# Patient Record
Sex: Female | Born: 1955
Health system: Southern US, Community
[De-identification: ages and names within clinical notes are randomized; demographics above are authoritative.]

## PROBLEM LIST (undated history)

## (undated) DIAGNOSIS — E559 Vitamin D deficiency, unspecified: Secondary | ICD-10-CM

## (undated) DIAGNOSIS — G8929 Other chronic pain: Secondary | ICD-10-CM

## (undated) DIAGNOSIS — M8000XA Age-related osteoporosis with current pathological fracture, unspecified site, initial encounter for fracture: Secondary | ICD-10-CM

## (undated) DIAGNOSIS — R519 Headache, unspecified: Secondary | ICD-10-CM

## (undated) DIAGNOSIS — M549 Dorsalgia, unspecified: Secondary | ICD-10-CM

## (undated) DIAGNOSIS — D649 Anemia, unspecified: Secondary | ICD-10-CM

## (undated) DIAGNOSIS — Z973 Presence of spectacles and contact lenses: Secondary | ICD-10-CM

## (undated) DIAGNOSIS — E039 Hypothyroidism, unspecified: Secondary | ICD-10-CM

## (undated) DIAGNOSIS — F419 Anxiety disorder, unspecified: Secondary | ICD-10-CM

## (undated) DIAGNOSIS — E538 Deficiency of other specified B group vitamins: Secondary | ICD-10-CM

## (undated) HISTORY — PX: TONSILLECTOMY: SUR1361

## (undated) HISTORY — DX: Anemia, unspecified: D64.9

## (undated) HISTORY — DX: Hypothyroidism, unspecified: E03.9

## (undated) HISTORY — DX: Age-related osteoporosis with current pathological fracture, unspecified site, initial encounter for fracture: M80.00XA

## (undated) HISTORY — PX: ESOPHAGOGASTRODUODENOSCOPY: SHX1529

---

## 2002-01-08 HISTORY — PX: UPPER GI ENDOSCOPY: SHX6162

## 2002-01-08 HISTORY — PX: GASTRIC BYPASS: SHX52

## 2002-02-16 ENCOUNTER — Ambulatory Visit (HOSPITAL_COMMUNITY): Admission: RE | Admit: 2002-02-16 | Discharge: 2002-02-16 | Payer: Self-pay | Admitting: Obstetrics and Gynecology

## 2002-02-16 ENCOUNTER — Encounter: Payer: Self-pay | Admitting: Obstetrics and Gynecology

## 2004-09-08 ENCOUNTER — Ambulatory Visit (HOSPITAL_COMMUNITY): Admission: RE | Admit: 2004-09-08 | Discharge: 2004-09-08 | Payer: Self-pay | Admitting: Obstetrics and Gynecology

## 2005-11-30 ENCOUNTER — Ambulatory Visit (HOSPITAL_COMMUNITY): Admission: RE | Admit: 2005-11-30 | Discharge: 2005-11-30 | Payer: Self-pay | Admitting: Obstetrics and Gynecology

## 2007-12-05 ENCOUNTER — Ambulatory Visit (HOSPITAL_COMMUNITY): Admission: RE | Admit: 2007-12-05 | Discharge: 2007-12-05 | Payer: Self-pay | Admitting: Obstetrics and Gynecology

## 2009-05-02 ENCOUNTER — Ambulatory Visit (HOSPITAL_COMMUNITY): Admission: RE | Admit: 2009-05-02 | Discharge: 2009-05-02 | Payer: Self-pay | Admitting: Obstetrics and Gynecology

## 2011-04-03 ENCOUNTER — Ambulatory Visit (HOSPITAL_COMMUNITY)
Admission: RE | Admit: 2011-04-03 | Discharge: 2011-04-03 | Disposition: A | Discharge status: Home / Self Care | Payer: BC Managed Care – PPO | Source: Ambulatory Visit | Attending: Pulmonary Disease | Admitting: Pulmonary Disease

## 2011-04-03 ENCOUNTER — Other Ambulatory Visit (HOSPITAL_COMMUNITY): Payer: Self-pay | Admitting: Pulmonary Disease

## 2011-04-03 DIAGNOSIS — R059 Cough, unspecified: Secondary | ICD-10-CM | POA: Insufficient documentation

## 2011-04-03 DIAGNOSIS — R509 Fever, unspecified: Secondary | ICD-10-CM

## 2011-04-03 DIAGNOSIS — R05 Cough: Secondary | ICD-10-CM

## 2012-07-14 ENCOUNTER — Other Ambulatory Visit: Payer: Self-pay | Admitting: Internal Medicine

## 2012-07-14 DIAGNOSIS — Z139 Encounter for screening, unspecified: Secondary | ICD-10-CM

## 2012-07-25 ENCOUNTER — Ambulatory Visit (HOSPITAL_COMMUNITY)
Admission: RE | Admit: 2012-07-25 | Discharge: 2012-07-25 | Disposition: A | Discharge status: Home / Self Care | Payer: BC Managed Care – PPO | Source: Ambulatory Visit | Attending: Internal Medicine | Admitting: Internal Medicine

## 2012-07-25 DIAGNOSIS — Z139 Encounter for screening, unspecified: Secondary | ICD-10-CM

## 2012-07-25 DIAGNOSIS — Z1231 Encounter for screening mammogram for malignant neoplasm of breast: Secondary | ICD-10-CM | POA: Insufficient documentation

## 2012-08-20 ENCOUNTER — Other Ambulatory Visit: Payer: Self-pay | Admitting: Geriatric Medicine

## 2013-06-23 ENCOUNTER — Telehealth: Payer: Self-pay

## 2013-07-01 NOTE — Telephone Encounter (Signed)
Pt is scheduled an OV with Gerrit HallsAnna Sams, NP on 07/27/2013 at 1:30 PM due to low hemoglobin and to schedule colonoscopy.

## 2013-07-27 ENCOUNTER — Encounter (INDEPENDENT_AMBULATORY_CARE_PROVIDER_SITE_OTHER): Payer: Self-pay

## 2013-07-27 ENCOUNTER — Encounter: Payer: Self-pay | Admitting: Gastroenterology

## 2013-07-27 ENCOUNTER — Ambulatory Visit (INDEPENDENT_AMBULATORY_CARE_PROVIDER_SITE_OTHER): Payer: BC Managed Care – PPO | Admitting: Gastroenterology

## 2013-07-27 VITALS — BP 131/70 | HR 64 | Temp 97.7°F | Resp 18 | Ht 68.0 in | Wt 227.0 lb

## 2013-07-27 DIAGNOSIS — Z1211 Encounter for screening for malignant neoplasm of colon: Secondary | ICD-10-CM

## 2013-07-27 DIAGNOSIS — D6489 Other specified anemias: Secondary | ICD-10-CM

## 2013-07-27 DIAGNOSIS — K912 Postsurgical malabsorption, not elsewhere classified: Secondary | ICD-10-CM

## 2013-07-27 MED ORDER — PEG 3350-KCL-NA BICARB-NACL 420 G PO SOLR: 4000.0000 mL | ORAL | Status: DC

## 2013-07-27 NOTE — Progress Notes (Signed)
Primary Care Physician:  Bufford SpikesEED, [name], DO Primary Gastroenterologist:  Dr. Darrick PennaFields   Chief Complaint  Patient presents with  . 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 She has a history of gastric bypass in the remote past, with reported anemia. No recent blood work completed. No prior colonoscopy. No FH of colon cancer. No FH of colon polyps. Mom had history of Barrett's esophagus. Has history of anemia, with Hgb around 10 or 11 for several years. Denies any prior hemoccult positive tests. Feels fatigued. Last blood work in Jan 2014.No abdominal pain. No N/V. No dysphagia. No GERD. No constipation/diarrhea. No hematochezia or melena. Takes NSAIDs routinely for headaches.   Past Medical History  Diagnosis Date  . Anemia   . Hypertension     resolved after gastric bypass  . Diabetes mellitus     resolved after gastric bypass  . Hypothyroidism     Past Surgical History  Procedure Laterality Date  . [place]  . Cesarean section   x 2    Current Outpatient Prescriptions  Medication Sig Dispense Refill  . escitalopram (LEXAPRO) 10 MG tablet Take 10 mg by mouth daily.      Marland Kitchen. levothyroxine (SYNTHROID, LEVOTHROID) 50 MCG tablet Take 50 mcg by mouth daily before breakfast.       No current facility-administered medications for this visit.    Allergies as of 07/27/2013  . (Not on File)    Family History  Problem Relation Age of Onset  . Heart disease Mother   . Diabetes Mother   . Stroke Mother   . Hypertension Mother   . Diabetes Father   . Heart disease Father   . Hypertension Father   . Colon cancer Neg Hx     History   Social History  . Marital Status: Married    Spouse Name: N/A    Number of Children: N/A  . Years of Education: N/A   Occupational History  . Dentist    Social History Main Topics  . Smoking  status: Former Games developermoker  . Smokeless tobacco: Not on file  . Alcohol Use: No  . Drug Use: No  . Sexual Activity: Not on file   Other Topics Concern  . Not on file   Social History Narrative  . No narrative on file    Review of Systems: Gen: Denies any fever, chills, fatigue, weight loss, lack of appetite.  CV: Denies chest pain, heart palpitations, peripheral edema, syncope.  Resp: Denies shortness of breath at rest or with exertion. Denies wheezing or cough.  GI: see HPI GU : Denies urinary burning, urinary frequency, urinary hesitancy MS: back pain Derm: Denies rash, itching, dry skin Psych: Denies depression, anxiety, memory loss, and confusion Heme: Denies bruising, bleeding, and enlarged lymph nodes.  Physical Exam: BP 131/70  Pulse 64  Temp(Src) 97.7 F (36.5 C) (Oral)  Resp 18  Ht 5\' 8"  (1.727 m)  Wt 227 lb (102.967 kg)  BMI 34.52 kg/m2 General:   Alert and oriented. Pleasant and cooperative. Well-nourished and well-developed.  Head:  Normocephalic and atraumatic. Eyes:  Without icterus, sclera clear and conjunctiva pink.  Ears:  Normal auditory acuity. Nose:  No deformity, discharge,  or lesions. Mouth:  No deformity or lesions, oral mucosa pink.  Lungs:  Clear to auscultation bilaterally.  No wheezes, rales, or rhonchi. No distress.  Heart:  S1, S2 present without murmurs appreciated.  Abdomen:  +BS, soft, non-tender and non-distended. No HSM noted. No guarding or rebound. No masses appreciated.  Rectal:  Deferred  Msk:  Symmetrical without gross deformities. Normal posture. Pulses:  Normal pulses noted. Extremities:  Without clubbing or edema. Neurologic:  Alert and  oriented x4;  grossly normal neurologically. Skin:  Intact without significant lesions or rashes. Psych:  Alert and cooperative. Normal mood and affect.     Marland Kitchen

## 2013-07-27 NOTE — Patient Instructions (Signed)
We have scheduled you for a colonoscopy with possible upper endoscopy with Dr. Darrick PennaFields.  Please complete the blood work when you are able. We will call with the results.

## 2013-07-28 LAB — CBC
HEMATOCRIT: 31.4 % — AB (ref 36.0–46.0)
Hemoglobin: 10.5 g/dL — ABNORMAL LOW (ref 12.0–15.0)
MCH: 30 pg (ref 26.0–34.0)
MCHC: 33.4 g/dL (ref 30.0–36.0)
MCV: 89.7 fL (ref 78.0–100.0)
PLATELETS: 381 10*3/uL (ref 150–400)
RBC: 3.5 MIL/uL — AB (ref 3.87–5.11)
RDW: 13.8 % (ref 11.5–15.5)
WBC: 3.9 10*3/uL — AB (ref 4.0–10.5)

## 2013-07-28 LAB — HEMOGLOBIN A1C
Hgb A1c MFr Bld: 5.7 % — ABNORMAL HIGH (ref <5.7)
MEAN PLASMA GLUCOSE: 117 mg/dL — AB (ref <117)

## 2013-07-28 LAB — FERRITIN: Ferritin: 5 ng/mL — ABNORMAL LOW (ref 10–291)

## 2013-07-28 LAB — VITAMIN B12: VITAMIN B 12: 220 pg/mL (ref 211–911)

## 2013-07-28 LAB — IRON: Iron: 27 ug/dL — ABNORMAL LOW (ref 42–145)

## 2013-07-29 ENCOUNTER — Encounter (HOSPITAL_COMMUNITY): Payer: Self-pay | Admitting: Pharmacy Technician

## 2013-07-29 DIAGNOSIS — K912 Postsurgical malabsorption, not elsewhere classified: Secondary | ICD-10-CM | POA: Insufficient documentation

## 2013-07-29 DIAGNOSIS — Z1211 Encounter for screening for malignant neoplasm of colon: Secondary | ICD-10-CM | POA: Insufficient documentation

## 2013-07-29 DIAGNOSIS — D649 Anemia, unspecified: Secondary | ICD-10-CM | POA: Insufficient documentation

## 2013-07-29 LAB — VITAMIN D 25 HYDROXY (VIT D DEFICIENCY, FRACTURES): VIT D 25 HYDROXY: 35 ng/mL (ref 30–89)

## 2013-07-29 NOTE — Assessment & Plan Note (Signed)
Gastric bypass in 2004. Labs as planned.

## 2013-07-29 NOTE — Assessment & Plan Note (Signed)
With history of gastric bypass. Last labs over a year ago. Likely due to malabsorption, IDA. Needs to take supplemental vitamins, iron daily. Check labs now.

## 2013-07-29 NOTE — Assessment & Plan Note (Signed)
58 year old female presenting for initial screening colonoscopy without any concerning signs. No FH of colon cancer. Anemia historically noted; will update labs today and likely pursue EGD at time of TCS if evidence of IDA.   Proceed with colonoscopy +/- EGD with Dr. Darrick PennaFields in the near future. The risks, benefits, and alternatives have been discussed in detail with the patient. They state understanding and desire to proceed.  CBC, iron, ferritin today

## 2013-08-07 ENCOUNTER — Encounter (HOSPITAL_COMMUNITY)
Admission: RE | Disposition: A | Discharge status: Home / Self Care | Payer: Self-pay | Source: Ambulatory Visit | Attending: Gastroenterology

## 2013-08-07 ENCOUNTER — Encounter (HOSPITAL_COMMUNITY): Payer: Self-pay | Admitting: *Deleted

## 2013-08-07 ENCOUNTER — Ambulatory Visit (HOSPITAL_COMMUNITY)
Admission: RE | Admit: 2013-08-07 | Discharge: 2013-08-07 | Disposition: A | Discharge status: Home / Self Care | Payer: BC Managed Care – PPO | Source: Ambulatory Visit | Attending: Gastroenterology | Admitting: Gastroenterology

## 2013-08-07 DIAGNOSIS — K644 Residual hemorrhoidal skin tags: Secondary | ICD-10-CM | POA: Insufficient documentation

## 2013-08-07 DIAGNOSIS — K259 Gastric ulcer, unspecified as acute or chronic, without hemorrhage or perforation: Secondary | ICD-10-CM | POA: Insufficient documentation

## 2013-08-07 DIAGNOSIS — D509 Iron deficiency anemia, unspecified: Secondary | ICD-10-CM

## 2013-08-07 DIAGNOSIS — K222 Esophageal obstruction: Secondary | ICD-10-CM | POA: Insufficient documentation

## 2013-08-07 DIAGNOSIS — Q438 Other specified congenital malformations of intestine: Secondary | ICD-10-CM | POA: Insufficient documentation

## 2013-08-07 DIAGNOSIS — K912 Postsurgical malabsorption, not elsewhere classified: Secondary | ICD-10-CM

## 2013-08-07 DIAGNOSIS — Z9884 Bariatric surgery status: Secondary | ICD-10-CM | POA: Insufficient documentation

## 2013-08-07 DIAGNOSIS — Z79899 Other long term (current) drug therapy: Secondary | ICD-10-CM | POA: Insufficient documentation

## 2013-08-07 DIAGNOSIS — Z87891 Personal history of nicotine dependence: Secondary | ICD-10-CM | POA: Insufficient documentation

## 2013-08-07 DIAGNOSIS — K648 Other hemorrhoids: Secondary | ICD-10-CM | POA: Insufficient documentation

## 2013-08-07 DIAGNOSIS — E039 Hypothyroidism, unspecified: Secondary | ICD-10-CM | POA: Insufficient documentation

## 2013-08-07 HISTORY — PX: COLONOSCOPY: SHX5424

## 2013-08-07 HISTORY — PX: ESOPHAGOGASTRODUODENOSCOPY: SHX5428

## 2013-08-07 HISTORY — DX: Other chronic pain: G89.29

## 2013-08-07 HISTORY — DX: Dorsalgia, unspecified: M54.9

## 2013-08-07 SURGERY — COLONOSCOPY
Anesthesia: Moderate Sedation

## 2013-08-07 MED ORDER — SODIUM CHLORIDE 0.9 % IV SOLN
INTRAVENOUS | Status: DC
  Administered 2013-08-07: 09:00:00 via INTRAVENOUS

## 2013-08-07 MED ORDER — MIDAZOLAM HCL 5 MG/5ML IJ SOLN
INTRAMUSCULAR | Status: DC | PRN
  Administered 2013-08-07: 1 mg via INTRAVENOUS
  Administered 2013-08-07: 2 mg via INTRAVENOUS
  Administered 2013-08-07: 1 mg via INTRAVENOUS
  Administered 2013-08-07: 2 mg via INTRAVENOUS
  Administered 2013-08-07: 1 mg via INTRAVENOUS

## 2013-08-07 MED ORDER — STERILE WATER FOR IRRIGATION IR SOLN
Status: DC | PRN
  Administered 2013-08-07: 10:00:00

## 2013-08-07 MED ORDER — MEPERIDINE HCL 100 MG/ML IJ SOLN
INTRAMUSCULAR | Status: AC
  Filled 2013-08-07: qty 2

## 2013-08-07 MED ORDER — MIDAZOLAM HCL 5 MG/5ML IJ SOLN
INTRAMUSCULAR | Status: AC
  Filled 2013-08-07: qty 10

## 2013-08-07 MED ORDER — MEPERIDINE HCL 100 MG/ML IJ SOLN
INTRAMUSCULAR | Status: DC | PRN
  Administered 2013-08-07 (×3): 25 mg via INTRAVENOUS

## 2013-08-07 MED ORDER — LIDOCAINE VISCOUS 2 % MT SOLN
OROMUCOSAL | Status: DC | PRN
  Administered 2013-08-07: 1 via OROMUCOSAL

## 2013-08-07 NOTE — Progress Notes (Signed)
REVIEWED.  

## 2013-08-07 NOTE — Op Note (Signed)
[facility] [address] [place], [id number]   ENDOSCOPY PROCEDURE REPORT  PATIENT: April [name], April A.  MR#: [id number] BIRTHDATE: [birth year] , 57  yrs. old GENDER: Female  ENDOSCOPIST: Jonette EvaSandi Tamaka Sawin, MD REFERRED NG:[id number]:[name], M.D.  PROCEDURE DATE: 08/07/2013 PROCEDURE:   EGD w/ biopsy INDICATIONS:Iron deficiency anemia Jul 27 2013: Hb 10.5, FERRITIN 5. PSHx: ROUX-en-Y GASTRIC BYPASS.Marland Kitchen. MEDICATIONS: TCS+ Demerol 25 mg IV and Versed 1mg  IV TOPICAL ANESTHETIC:   Viscous Xylocaine  DESCRIPTION OF PROCEDURE:     Physical exam was performed.  Informed consent was obtained from the patient after explaining the benefits, risks, and alternatives to the procedure.  The patient was connected to the monitor and placed in the left lateral position.  Continuous oxygen was provided by nasal cannula and IV medicine administered through an indwelling cannula.  After administration of sedation, the patients esophagus was intubated and the EG-2990i ([id number])  endoscope was advanced under direct visualization to the second portion of the duodenum.  The scope was removed slowly by carefully examining the color, texture, anatomy, and integrity of the mucosa on the way out.  The patient was recovered in endoscopy and discharged home in satisfactory condition.   ESOPHAGUS: A Schatzki ring was found at the gastroesophageal junction and was widely open.   STOMACH: 2 CM GASTRIC POUCH-NORMAL. DUODENUM: SMALL CLEAN BASED ULCER AT ANASTOMOSIS.  COLD FORCEPS BIOPSIES OBTAINED.  OTHERWISE NORMAL SMALL BOWEL.  COMPLICATIONS:   None  ENDOSCOPIC IMPRESSION: 1.   Schatzki ring at the gastroesophageal junction 2.    2 CM GASTRIC POUCH 3.  SMALL CLEAN BASED ULCER AT ANASTOMOSIS  RECOMMENDATIONS: MINIMIZE USE OF NSAIDS. SEE HEMATOLOGY FOR IV IRON. FOLLOW A HIGH FIBER/LOW FAT DIET AS TOLERATED.  AVOID ITEMS THAT CAUSE BLOATING. BIOPSY WILL BE BACK IN 7 DAYS.  Next colonoscopy in 10  years.  CONSIDER OVERTUBE.   REPEAT EXAM:   _______________________________ Rosalie DoctoreSignedJonette Eva:  Bethsaida Siegenthaler, MD 08/07/2013 11:10 AM

## 2013-08-07 NOTE — Op Note (Signed)
[facility] [address] [place], [id number]   COLONOSCOPY PROCEDURE REPORT  PATIENT: April [name], April A.  MR#: [id number] BIRTHDATE: 12/05/1955 , 57  yrs. old GENDER: Female ENDOSCOPIST: Jonette EvaSandi Bhavya Grand, MD REFERRED XB:[id number]:[name], M.D. PROCEDURE DATE:  08/07/2013 PROCEDURE:   Colonoscopy, diagnostic INDICATIONS:Iron Deficiency Anemia. MEDICATIONS: Demerol 50 mg IV and Versed 6 mg IV  DESCRIPTION OF PROCEDURE:    Physical exam was performed.  Informed consent was obtained from the patient after explaining the benefits, risks, and alternatives to procedure.  The patient was connected to monitor and placed in left lateral position. Continuous oxygen was provided by nasal cannula and IV medicine administered through an indwelling cannula.  After administration of sedation and rectal exam, the patients rectum was intubated and the EC-3890Li ([id number]) and EG-2990i ([id number])  colonoscope was advanced under direct visualization to the ileum.  The scope was removed slowly by carefully examining the color, texture, anatomy, and integrity mucosa on the way out.  The patient was recovered in endoscopy and discharged home in satisfactory condition.    COLON FINDINGS: The mucosa appeared normal in the terminal ileum.  , The LEFT colon IS redundant.  Manual abdominal counter-pressure was used to reach the cecum, The colon mucosa was otherwise normal.  , Moderate sized internal hemorrhoids were found.  , and Large external hemorrhoids were found.  PREP QUALITY: excellent.  CECAL W/D TIME: 14 minutes COMPLICATIONS: HR DROPPED TO 48 WITH SCOPE LOOPING IN LEFT COLON.  ENDOSCOPIC IMPRESSION: 1.   Normal mucosa in the terminal ileum 2.   The LEFT colon IS redundant 3.   The colon mucosa was otherwise normal 4.   Moderate sized internal hemorrhoids 5.   Large external hemorrhoids   RECOMMENDATIONS: MINIMIZE USE OF NSAIDS. SEE HEMATOLOGY FOR IV IRON. FOLLOW A HIGH FIBER/LOW  FAT DIET AS TOLERATED.  AVOID ITEMS THAT CAUSE BLOATING. BIOPSY WILL BE BACK IN 7 DAYS.  Next colonoscopy in 10 years. CONSIDER OVERTUBE.     _______________________________ Rosalie DoctoreSignedJonette Eva:  Cerita Rabelo, MD 08/07/2013 10:57 AM

## 2013-08-07 NOTE — H&P (Addendum)
  Primary Care Physician:  Bufford SpikesEED, [name], DO Primary Gastroenterologist:  Dr. Darrick PennaFields  Pre-Procedure History & Physical: HPI:  April LewandowskyCynthia A [name] is a 58 y.o. female here for ANEMIA/GAB.  Past Medical History  Diagnosis Date  . Anemia   . Hypothyroidism   . Chronic back pain     Past Surgical History  Procedure Laterality Date  . [place]  . Ces[place] section   x 2  . Esophagogastroduodenoscopy      Prior to Admission medications   Medication Sig Start Date End Date Taking? Authorizing Provider  acetaminophen (TYLENOL) 500 MG tablet Take 500 mg by mouth every 6 (six) hours as needed for mild pain or headache.   Yes Historical Provider, MD  cholecalciferol (VITAMIN D) 1000 UNITS tablet Take 1,000 Units by mouth daily.   Yes Historical Provider, MD  escitalopram (LEXAPRO) 10 MG tablet Take 10 mg by mouth daily.   Yes Historical Provider, MD  ibuprofen (ADVIL,MOTRIN) 200 MG tablet Take 200 mg by mouth every 6 (six) hours as needed for headache or moderate pain.   Yes Historical Provider, MD  levothyroxine (SYNTHROID, LEVOTHROID) 50 MCG tablet Take 50 mcg by mouth daily before breakfast.   Yes Historical Provider, MD  Multiple Vitamin (MULTIVITAMIN WITH MINERALS) TABS tablet Take 1 tablet by mouth daily.   Yes Historical Provider, MD  polyethylene glycol-electrolytes (TRILYTE) 420 G solution Take 4,000 mLs by mouth as directed. 07/27/13  Yes West BaliSandi L Fields, MD    Allergies as of 07/27/2013  . (Not on File)    Family History  Problem Relation Age of Onset  . Heart disease Mother   . Diabetes Mother   . Stroke Mother   . Hypertension Mother   . Diabetes Father   . Heart disease Father   . Hypertension Father   . Colon cancer Neg Hx     History   Social History  . Marital Status: Married    Spouse Name: N/A    Number of Children: N/A  . Years of Education: N/A   Occupational History  . Dentist    Social History Main Topics  . Smoking status:  Former Smoker -- 0.25 packs/day for 10 years    Types: Cigarettes  . Smokeless tobacco: Not on file  . Alcohol Use: No  . Drug Use: No  . Sexual Activity: Not on file   Other Topics Concern  . Not on file   Social History Narrative  . No narrative on file    Review of Systems: See HPI, otherwise negative ROS   Physical Exam: BP 132/80  Pulse 66  Temp(Src) 98.1 F (36.7 C) (Oral)  Resp 14  Ht 5\' 8"  (1.727 m)  Wt 227 lb (102.967 kg)  BMI 34.52 kg/m2  SpO2 95% General:   Alert,  pleasant and cooperative in NAD Head:  Normocephalic and atraumatic. Neck:  Supple; Lungs:  Clear throughout to auscultation.    Heart:  Regular rate and rhythm. Abdomen:  Soft, nontender and nondistended. Normal bowel sounds, without guarding, and without rebound.   Neurologic:  Alert and  oriented x4;  grossly normal neurologically.  Impression/Plan:    ANEMIA/GASTRIC BYPASS  Plan: 1. TCS/EGD

## 2013-08-07 NOTE — Discharge Instructions (Signed)
YOUR ANEMIA IRON DEFICIENCY ANEMIA IS MOST LIKELY DUE TO HAVING GASTRIC BYPASS. YOUR POUCH IS ~2 CM. You have a small ulcer at your SMALL BOWEL ANASTOMOSIS. You have internal hemorrhoids.  YOU DID NOT HAVE ANY POLYPS. YOUR COLON IS REDUNDANT BUT OTHERWISE NORMAL. YOUR SMALL BOWEL LOOKS NORMAL.    MINIMIZE YOUR USE OF NSAIDS.  YOU SHOULD SEE HEMATOLOGY FOR IV IRON.  FOLLOW A HIGH FIBER/LOW FAT DIET AS TOLERATED. AVOID ITEMS THAT CAUSE BLOATING. SEE INFO BELOW.  YOUR BIOPSY WILL BE BACK IN 7 DAYS.  Next colonoscopy in 10 years.   ENDOSCOPY Care After Read the instructions outlined below and refer to this sheet in the next week. These discharge instructions provide you with general information on caring for yourself after you leave the hospital. While your treatment has been planned according to the most current medical practices available, unavoidable complications occasionally occur. If you have any problems or questions after discharge, call DR. Shantel Wesely, [phone].  ACTIVITY  You may resume your regular activity, but move at a slower pace for the next 24 hours.   Take frequent rest periods for the next 24 hours.   Walking will help get rid of the air and reduce the bloated feeling in your belly (abdomen).   No driving for 24 hours (because of the medicine (anesthesia) used during the test).   You may shower.   Do not sign any important legal documents or operate any machinery for 24 hours (because of the anesthesia used during the test).    NUTRITION  Drink plenty of fluids.   You may resume your normal diet as instructed by your doctor.   Begin with a light meal and progress to your normal diet. Heavy or fried foods are harder to digest and may make you feel sick to your stomach (nauseated).   Avoid alcoholic beverages for 24 hours or as instructed.    MEDICATIONS  You may resume your normal medications.   WHAT YOU CAN EXPECT TODAY  Some feelings of bloating in  the abdomen.   Passage of more gas than usual.   Spotting of blood in your stool or on the toilet paper  .  IF YOU HAD POLYPS REMOVED DURING THE ENDOSCOPY:  Eat a soft diet IF YOU HAVE NAUSEA, BLOATING, ABDOMINAL PAIN, OR VOMITING.    FINDING OUT THE RESULTS OF YOUR TEST Not all test results are available during your visit. DR. Darrick PennaFIELDS WILL CALL YOU WITHIN 7 DAYS OF YOUR PROCEDUE WITH YOUR RESULTS. Do not assume everything is normal if you have not heard from DR. Claudio Mondry IN ONE WEEK, CALL HER OFFICE AT [phone].  SEEK IMMEDIATE MEDICAL ATTENTION AND CALL THE OFFICE: [phone] IF:  You have more than a spotting of blood in your stool.   Your belly is swollen (abdominal distention).   You are nauseated or vomiting.   You have a temperature over 101F.   You have abdominal pain or discomfort that is severe or gets worse throughout the day.   High-Fiber Diet A high-fiber diet changes your normal diet to include more whole grains, legumes, fruits, and vegetables. Changes in the diet involve replacing refined carbohydrates with unrefined foods. The calorie level of the diet is essentially unchanged. The Dietary Reference Intake (recommended amount) for adult males is 38 grams per day. For adult females, it is 25 grams per day. Pregnant and lactating women should consume 28 grams of fiber per day. Fiber is the intact part of a plant that  is not broken down during digestion. Functional fiber is fiber that has been isolated from the plant to provide a beneficial effect in the body. PURPOSE  Increase stool bulk.   Ease and regulate bowel movements.   Lower cholesterol.  INDICATIONS THAT YOU NEED MORE FIBER  Constipation and hemorrhoids.   Uncomplicated diverticulosis (intestine condition) and irritable bowel syndrome.   Weight management.   As a protective measure against hardening of the arteries (atherosclerosis), diabetes, and cancer.   GUIDELINES FOR INCREASING FIBER IN  THE DIET  Start adding fiber to the diet slowly. A gradual increase of about 5 more grams (2 slices of whole-wheat bread, 2 servings of most fruits or vegetables, or 1 bowl of high-fiber cereal) per day is best. Too rapid an increase in fiber may result in constipation, flatulence, and bloating.   Drink enough water and fluids to keep your urine clear or pale yellow. Water, juice, or caffeine-free drinks are recommended. Not drinking enough fluid may cause constipation.   Eat a variety of high-fiber foods rather than one type of fiber.   Try to increase your intake of fiber through using high-fiber foods rather than fiber pills or supplements that contain small amounts of fiber.   The goal is to change the types of food eaten. Do not supplement your present diet with high-fiber foods, but replace foods in your present diet.  INCLUDE A VARIETY OF FIBER SOURCES  Replace refined and processed grains with whole grains, canned fruits with fresh fruits, and incorporate other fiber sources. White rice, white breads, and most bakery goods contain little or no fiber.   Brown whole-grain rice, buckwheat oats, and many fruits and vegetables are all good sources of fiber. These include: broccoli, Brussels sprouts, cabbage, cauliflower, beets, sweet potatoes, white potatoes (skin on), carrots, tomatoes, eggplant, squash, berries, fresh fruits, and dried fruits.   Cereals appear to be the richest source of fiber. Cereal fiber is found in whole grains and bran. Bran is the fiber-rich outer coat of cereal grain, which is largely removed in refining. In whole-grain cereals, the bran remains. In breakfast cereals, the largest amount of fiber is found in those with "bran" in their names. The fiber content is sometimes indicated on the label.   You may need to include additional fruits and vegetables each day.   In baking, for 1 cup white flour, you may use the following substitutions:   1 cup whole-wheat flour  minus 2 tablespoons.   1/2 cup white flour plus 1/2 cup whole-wheat flour.   Low-Fat Diet BREADS, CEREALS, PASTA, RICE, DRIED PEAS, AND BEANS These products are high in carbohydrates and most are low in fat. Therefore, they can be increased in the diet as substitutes for fatty foods. They too, however, contain calories and should not be eaten in excess. Cereals can be eaten for snacks as well as for breakfast.  Include foods that contain fiber (fruits, vegetables, whole grains, and legumes). Research shows that fiber may lower blood cholesterol levels, especially the water-soluble fiber found in fruits, vegetables, oat products, and legumes. FRUITS AND VEGETABLES It is good to eat fruits and vegetables. Besides being sources of fiber, both are rich in vitamins and some minerals. They help you get the daily allowances of these nutrients. Fruits and vegetables can be used for snacks and desserts. MEATS Limit lean meat, chicken, [place], and fish to no more than 6 ounces per day. Beef, Pork, and Lamb Use lean cuts of beef, pork, and lamb.  Lean cuts include:  Extra-lean ground beef.  Arm roast.  Sirloin tip.  Center-cut ham.  Round steak.  Loin chops.  Rump roast.  Tenderloin.  Trim all fat off the outside of meats before cooking. It is not necessary to severely decrease the intake of red meat, but lean choices should be made. Lean meat is rich in protein and contains a highly absorbable form of iron. Premenopausal women, in particular, should avoid reducing lean red meat because this could increase the risk for low red blood cells (iron-deficiency anemia). The organ meats, such as liver, sweetbreads, kidneys, and brain are very rich in cholesterol. They should be limited. Chicken and [place] These are good sources of protein. The fat of poultry can be reduced by removing the skin and underlying fat layers before cooking. Chicken and [place] can be substituted for lean red meat in the diet. Poultry  should not be fried or covered with high-fat sauces. Fish and Shellfish Fish is a good source of protein. Shellfish contain cholesterol, but they usually are low in saturated fatty acids. The preparation of fish is important. Like chicken and [place], they should not be fried or covered with high-fat sauces. EGGS Egg whites contain no fat or cholesterol. They can be eaten often. Try 1 to 2 egg whites instead of whole eggs in recipes or use egg substitutes that do not contain yolk. MILK AND DAIRY PRODUCTS Use skim or 1% milk instead of 2% or whole milk. Decrease whole milk, natural, and processed cheeses. Use nonfat or low-fat (2%) cottage cheese or low-fat cheeses made from vegetable oils. Choose nonfat or low-fat (1 to 2%) yogurt. Experiment with evaporated skim milk in recipes that call for heavy cream. Substitute low-fat yogurt or low-fat cottage cheese for sour cream in dips and salad dressings. Have at least 2 servings of low-fat dairy products, such as 2 glasses of skim (or 1%) milk each day to help get your daily calcium intake.  FATS AND OILS Reduce the total intake of fats, especially saturated fat. Butterfat, lard, and beef fats are high in saturated fat and cholesterol. These should be avoided as much as possible. Vegetable fats do not contain cholesterol, but certain vegetable fats, such as coconut oil, palm oil, and palm kernel oil are very high in saturated fats. These should be limited. These fats are often used in bakery goods, processed foods, popcorn, oils, and nondairy creamers. Vegetable shortenings and some peanut butters contain hydrogenated oils, which are also saturated fats. Read the labels on these foods and check for saturated vegetable oils. Unsaturated vegetable oils and fats do not raise blood cholesterol. However, they should be limited because they are fats and are high in calories. Total fat should still be limited to 30% of your daily caloric intake. Desirable liquid  vegetable oils are corn oil, cottonseed oil, olive oil, canola oil, safflower oil, soybean oil, and sunflower oil. Peanut oil is not as good, but small amounts are acceptable. Buy a heart-healthy tub margarine that has no partially hydrogenated oils in the ingredients. Mayonnaise and salad dressings often are made from unsaturated fats, but they should also be limited because of their high calorie and fat content. Seeds, nuts, peanut butter, olives, and avocados are high in fat, but the fat is mainly the unsaturated type. These foods should be limited mainly to avoid excess calories and fat. OTHER EATING TIPS Snacks  Most sweets should be limited as snacks. They tend to be rich in calories and fats, and  their caloric content outweighs their nutritional value. Some good choices in snacks are graham crackers, melba toast, soda crackers, bagels (no egg), English muffins, fruits, and vegetables. These snacks are preferable to snack crackers, [place] fries, and chips. Popcorn should be air-popped or cooked in small amounts of liquid vegetable oil. Desserts Eat fruit, low-fat yogurt, and fruit ices. AVOID pastries, cake, and cookies. Sherbet, angel food cake, gelatin dessert, frozen low-fat yogurt, or other frozen products that do not contain saturated fat (pure fruit juice bars, frozen ice pops) are also acceptable.  COOKING METHODS Choose those methods that use little or no fat. They include: Poaching.  Braising.  Steaming.  Grilling.  Baking.  Stir-frying.  Broiling.  Microwaving.  Foods can be cooked in a nonstick pan without added fat, or use a nonfat cooking spray in regular cookware. Limit fried foods and avoid frying in saturated fat. Add moisture to lean meats by using water, broth, cooking wines, and other nonfat or low-fat sauces along with the cooking methods mentioned above. Soups and stews should be chilled after cooking. The fat that forms on top after a few hours in the refrigerator should  be skimmed off. When preparing meals, avoid using excess salt. Salt can contribute to raising blood pressure in some people. EATING AWAY FROM HOME Order entres, potatoes, and vegetables without sauces or butter. When meat exceeds the size of a deck of cards (3 to 4 ounces), the rest can be taken home for another meal. Choose vegetable or fruit salads and ask for low-calorie salad dressings to be served on the side. Use dressings sparingly. Limit high-fat toppings, such as bacon, crumbled eggs, cheese, sunflower seeds, and olives. Ask for heart-healthy tub margarine instead of butter.  Hemorrhoids Hemorrhoids are dilated (enlarged) veins around the rectum. Sometimes clots will form in the veins. This makes them swollen and painful. These are called thrombosed hemorrhoids. Causes of hemorrhoids include:  Constipation.   Straining to have a bowel movement.   HEAVY LIFTING HOME CARE INSTRUCTIONS  Eat a well balanced diet and drink 6 to 8 glasses of water every day to avoid constipation. You may also use a bulk laxative.   Avoid straining to have bowel movements.   Keep anal area dry and clean.   Do not use a donut shaped pillow or sit on the toilet for long periods. This increases blood pooling and pain.   Move your bowels when your body has the urge; this will require less straining and will decrease pain and pressure.

## 2013-08-11 NOTE — Progress Notes (Signed)
Quick Note:  IDA. Patient has completed TCS/EGD with Dr. Darrick PennaFields. Needs referral to Hematology if not already.  Will likely need IV iron infusions. Hx of gastric bypass.  B12 lower end of normal but still normal. Vit D also lower end of normal.  Recommend daily multivitamin, sublingual OTC B12, and Calcium +D daily.  A1c is right at 5.7. She is at increased risk for diabetes. ______

## 2013-08-12 NOTE — Progress Notes (Signed)
Quick Note:  LMOM home and mobile. ______

## 2013-08-14 NOTE — Progress Notes (Signed)
Quick Note:  Called and left message for pt to call office for results. She is out of town this week-end. ______

## 2013-08-17 ENCOUNTER — Other Ambulatory Visit: Payer: Self-pay | Admitting: Gastroenterology

## 2013-08-17 DIAGNOSIS — D509 Iron deficiency anemia, unspecified: Secondary | ICD-10-CM

## 2013-08-17 NOTE — Progress Notes (Signed)
Quick Note:  Pt called and was informed of the info. She is not aware if Hematology appt has been made. Routing to Soledad GerlachLeigh Ann to make referral and she can be reached at her office @ [phone] and just ask to speak to Dr. Kaleen OdeaBolton about her appt. ______

## 2013-08-29 ENCOUNTER — Telehealth: Payer: Self-pay | Admitting: Gastroenterology

## 2013-08-29 NOTE — Telephone Encounter (Signed)
Called patient TO DISCUSS RESULTS. LVM-CALL [phone] TO DISCUSS. BIOSPIES SHOWS DUODENITIS.   MINIMIZE YOUR USE OF NSAIDS. SEE HEMATOLOGY FOR IV IRON. FOLLOW A HIGH FIBER/LOW FAT DIET AS TOLERATED. AVOID ITEMS THAT CAUSE BLOATING.  Next colonoscopy in 10 years.

## 2013-08-29 NOTE — Progress Notes (Signed)
REVIEWED.  

## 2013-08-31 ENCOUNTER — Ambulatory Visit (HOSPITAL_COMMUNITY): Payer: BC Managed Care – PPO

## 2013-08-31 ENCOUNTER — Other Ambulatory Visit: Payer: Self-pay | Admitting: Gastroenterology

## 2013-08-31 DIAGNOSIS — D509 Iron deficiency anemia, unspecified: Secondary | ICD-10-CM

## 2013-08-31 NOTE — Telephone Encounter (Signed)
Referral has been made to [facility]  

## 2013-09-01 NOTE — Telephone Encounter (Signed)
LMOM to call.

## 2013-09-03 NOTE — Telephone Encounter (Signed)
Tried to call pt's office. They are closed due to vacation.  Mailed letter to call for results.

## 2013-09-10 ENCOUNTER — Telehealth (HOSPITAL_COMMUNITY): Payer: Self-pay | Admitting: Hematology and Oncology

## 2013-09-10 ENCOUNTER — Encounter (HOSPITAL_COMMUNITY): Payer: BC Managed Care – PPO | Attending: Hematology and Oncology

## 2013-09-10 VITALS — Resp 18 | Ht 68.0 in

## 2013-09-10 DIAGNOSIS — K9589 Other complications of other bariatric procedure: Secondary | ICD-10-CM | POA: Insufficient documentation

## 2013-09-10 DIAGNOSIS — Z9884 Bariatric surgery status: Secondary | ICD-10-CM | POA: Insufficient documentation

## 2013-09-10 DIAGNOSIS — Z87891 Personal history of nicotine dependence: Secondary | ICD-10-CM | POA: Diagnosis not present

## 2013-09-10 DIAGNOSIS — F3289 Other specified depressive episodes: Secondary | ICD-10-CM | POA: Insufficient documentation

## 2013-09-10 DIAGNOSIS — R5381 Other malaise: Secondary | ICD-10-CM | POA: Insufficient documentation

## 2013-09-10 DIAGNOSIS — K912 Postsurgical malabsorption, not elsewhere classified: Secondary | ICD-10-CM | POA: Diagnosis not present

## 2013-09-10 DIAGNOSIS — K909 Intestinal malabsorption, unspecified: Secondary | ICD-10-CM | POA: Insufficient documentation

## 2013-09-10 DIAGNOSIS — E538 Deficiency of other specified B group vitamins: Secondary | ICD-10-CM | POA: Insufficient documentation

## 2013-09-10 DIAGNOSIS — D508 Other iron deficiency anemias: Secondary | ICD-10-CM | POA: Insufficient documentation

## 2013-09-10 DIAGNOSIS — E039 Hypothyroidism, unspecified: Secondary | ICD-10-CM | POA: Diagnosis not present

## 2013-09-10 DIAGNOSIS — E611 Iron deficiency: Secondary | ICD-10-CM

## 2013-09-10 DIAGNOSIS — D509 Iron deficiency anemia, unspecified: Secondary | ICD-10-CM

## 2013-09-10 DIAGNOSIS — F329 Major depressive disorder, single episode, unspecified: Secondary | ICD-10-CM | POA: Diagnosis not present

## 2013-09-10 DIAGNOSIS — R5383 Other fatigue: Secondary | ICD-10-CM

## 2013-09-10 DIAGNOSIS — K9089 Other intestinal malabsorption: Secondary | ICD-10-CM

## 2013-09-10 LAB — CBC WITH DIFFERENTIAL/PLATELET
Basophils Absolute: 0 10*3/uL (ref 0.0–0.1)
Basophils Relative: 1 % (ref 0–1)
EOS PCT: 3 % (ref 0–5)
Eosinophils Absolute: 0.1 10*3/uL (ref 0.0–0.7)
HEMATOCRIT: 33.9 % — AB (ref 36.0–46.0)
Hemoglobin: 11.2 g/dL — ABNORMAL LOW (ref 12.0–15.0)
LYMPHS ABS: 1.6 10*3/uL (ref 0.7–4.0)
LYMPHS PCT: 33 % (ref 12–46)
MCH: 31.3 pg (ref 26.0–34.0)
MCHC: 33 g/dL (ref 30.0–36.0)
MCV: 94.7 fL (ref 78.0–100.0)
MONO ABS: 0.4 10*3/uL (ref 0.1–1.0)
Monocytes Relative: 7 % (ref 3–12)
NEUTROS ABS: 2.7 10*3/uL (ref 1.7–7.7)
Neutrophils Relative %: 56 % (ref 43–77)
PLATELETS: 419 10*3/uL — AB (ref 150–400)
RBC: 3.58 MIL/uL — AB (ref 3.87–5.11)
RDW: 13.4 % (ref 11.5–15.5)
WBC: 4.9 10*3/uL (ref 4.0–10.5)

## 2013-09-10 LAB — COMPREHENSIVE METABOLIC PANEL
ALT: 19 U/L (ref 0–35)
AST: 25 U/L (ref 0–37)
Albumin: 3.6 g/dL (ref 3.5–5.2)
Alkaline Phosphatase: 103 U/L (ref 39–117)
Anion gap: 11 (ref 5–15)
BILIRUBIN TOTAL: 0.3 mg/dL (ref 0.3–1.2)
BUN: 10 mg/dL (ref 6–23)
CHLORIDE: 105 meq/L (ref 96–112)
CO2: 28 meq/L (ref 19–32)
Calcium: 9 mg/dL (ref 8.4–10.5)
Creatinine, Ser: 0.83 mg/dL (ref 0.50–1.10)
GFR calc Af Amer: 89 mL/min — ABNORMAL LOW (ref >90)
GFR, EST NON AFRICAN AMERICAN: 77 mL/min — AB (ref >90)
Glucose, Bld: 94 mg/dL (ref 70–99)
Potassium: 3.2 mEq/L — ABNORMAL LOW (ref 3.7–5.3)
SODIUM: 144 meq/L (ref 137–147)
Total Protein: 7 g/dL (ref 6.0–8.3)

## 2013-09-10 NOTE — Telephone Encounter (Signed)
Per Laroy Apple is not needed for [phone]. REF#[id number]

## 2013-09-10 NOTE — Progress Notes (Signed)
McCook A. Barnet Glasgow, M.D.  NEW PATIENT EVALUATION   Name: April [name] Date: 09/10/2013 MRN: [id number] DOB: 1955-03-22  PCP: Hollace Kinnier, DO   REFERRING PHYSICIAN: Orvil Feil, NP  REASON FOR REFERRAL: Iron deficiency, status post gastric bypass.     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 She has been more fatigued of late with craving for ice. She denies any myrtle hair or changes in her nail beds. She denies any melena, hematochezia, hematuria, vaginal bleeding, epistaxis, or hemoptysis. She denies a lower 70 swelling or redness, chest pain, PND, orthopnea, palpitations, skin rash, headache, or seizures. She has undergone gastric bypass surgery 10 years ago and demonstrated for about 4 years following the procedure. She lost a total of 140 pounds some of which he has gained back. She has never received intravenous iron before.   PAST MEDICAL HISTORY:  has a past medical history of Anemia; Hypothyroidism; and Chronic back pain.     PAST SURGICAL HISTORY: Past Surgical History  Procedure Laterality Date  . Gastric bypass      [place]  . Cesarean section   x 2  . Esophagogastroduodenoscopy    . Colonoscopy N/A 08/07/2013    Procedure: COLONOSCOPY;  Surgeon: Danie Binder, MD;  [facility];  Service: Endoscopy;  Laterality: N/A;  10:30-moved to [place] notified pt  . Esophagogastroduodenoscopy N/A 08/07/2013    Procedure: ESOPHAGOGASTRODUODENOSCOPY (EGD);  Surgeon: Danie Binder, MD;  Location: [facility];  Service: Endoscopy;  Laterality: N/A;     CURRENT MEDICATIONS: has a current medication list which includes the following prescription(s): acetaminophen, cholecalciferol, escitalopram, levothyroxine, multivitamin  with minerals, and ibuprofen.   ALLERGIES: Review of patient's allergies indicates no known allergies.   SOCIAL HISTORY:  reports that she has quit smoking. Her smoking use included Cigarettes. She has a 2.5 pack-year smoking history. She does not have any smokeless tobacco history on file. She reports that she does not drink alcohol or use illicit drugs.   FAMILY HISTORY: family history includes Diabetes in her father and mother; Heart disease in her father and mother; Hypertension in her father and mother; Stroke in her mother. There is no history of Colon cancer.    REVIEW OF SYSTEMS:  Other than that discussed above is noncontributory.    PHYSICAL EXAM:  height is _0  (1.727 m). Her respiration is 18 and oxygen saturation is 98%.    GENERAL:alert, no distress and comfortable SKIN: skin color, texture, turgor are normal, no rashes or significant lesions. Pallor. EYES: normal, Conjunctiva are pink and non-injected, sclera clear OROPHARYNX:no exudate, no erythema and lips, buccal mucosa, and tongue normal  NECK: supple, thyroid normal size, non-tender, without nodularity CHEST: Normal AP diameter with no breast masses. LYMPH:  no palpable lymphadenopathy in the cervical, axillary or inguinal LUNGS: clear to auscultation and percussion with normal breathing effort HEART: regular rate & rhythm and no murmurs ABDOMEN:abdomen soft, non-tender and normal bowel sounds MUSCULOSKELETALl:no cyanosis of digits, no clubbing or edema  NEURO: alert & oriented x 3 with fluent speech, no focal motor/sensory deficits    LABORATORY DATA:  Office Visit on 09/10/2013  Component Date Value Ref Range Status  . WBC 09/10/2013 4.9  4.0 - 10.5 K/uL Final  .  RBC 09/10/2013 3.58* 3.87 - 5.11 MIL/uL Final  . Hemoglobin 09/10/2013 11.2* 12.0 - 15.0 g/dL Final  . HCT 09/10/2013 33.9* 36.0 - 46.0 % Final  . MCV 09/10/2013 94.7  78.0 - 100.0 fL Final  . MCH 09/10/2013 31.3  26.0 - 34.0 pg Final  . MCHC  09/10/2013 33.0  30.0 - 36.0 g/dL Final  . RDW 09/10/2013 13.4  11.5 - 15.5 % Final  . Platelets 09/10/2013 419* 150 - 400 K/uL Final  . Neutrophils Relative % 09/10/2013 56  43 - 77 % Final  . Neutro Abs 09/10/2013 2.7  1.7 - 7.7 K/uL Final  . Lymphocytes Relative 09/10/2013 33  12 - 46 % Final  . Lymphs Abs 09/10/2013 1.6  0.7 - 4.0 K/uL Final  . Monocytes Relative 09/10/2013 7  3 - 12 % Final  . Monocytes Absolute 09/10/2013 0.4  0.1 - 1.0 K/uL Final  . Eosinophils Relative 09/10/2013 3  0 - 5 % Final  . Eosinophils Absolute 09/10/2013 0.1  0.0 - 0.7 K/uL Final  . Basophils Relative 09/10/2013 1  0 - 1 % Final  . Basophils Absolute 09/10/2013 0.0  0.0 - 0.1 K/uL Final  . Sodium 09/10/2013 144  137 - 147 mEq/L Final  . Potassium 09/10/2013 3.2* 3.7 - 5.3 mEq/L Final  . Chloride 09/10/2013 105  96 - 112 mEq/L Final  . CO2 09/10/2013 28  19 - 32 mEq/L Final  . Glucose, Bld 09/10/2013 94  70 - 99 mg/dL Final  . BUN 09/10/2013 10  6 - 23 mg/dL Final  . Creatinine, Ser 09/10/2013 0.83  0.50 - 1.10 mg/dL Final  . Calcium 09/10/2013 9.0  8.4 - 10.5 mg/dL Final  . Total Protein 09/10/2013 7.0  6.0 - 8.3 g/dL Final  . Albumin 09/10/2013 3.6  3.5 - 5.2 g/dL Final  . AST 09/10/2013 25  0 - 37 U/L Final  . ALT 09/10/2013 19  0 - 35 U/L Final  . Alkaline Phosphatase 09/10/2013 103  39 - 117 U/L Final  . Total Bilirubin 09/10/2013 0.3  0.3 - 1.2 mg/dL Final  . GFR calc non Af Amer 09/10/2013 77* >90 mL/min Final  . GFR calc Af Amer 09/10/2013 89* >90 mL/min Final   Comment: (NOTE)                          The eGFR has been calculated using the CKD EPI equation.                          This calculation has not been validated in all clinical situations.                          eGFR's persistently <90 mL/min signify possible Chronic Kidney                          Disease.  . Anion gap 09/10/2013 11  5 - 15 Final    Urinalysis No results found for this basename: colorurine,  appearanceur,   labspec,  phurine,  glucoseu,  hgbur,  bilirubinur,  ketonesur,  proteinur,  urobilinogen,  nitrite,  leukocytesur      _0 : No results found.  PATHOLOGY: Peripheral smear reveals microcytic hypochromic red cells.   IMPRESSION:  #1. Iron deficiency secondary to malabsorption due to gastric bypass surgery. #2. Depression, on treatment. #  3. Hypothyroidism, on treatment.  PLAN:  #1. Intravenous iron on 09/11/2013 and 09/18/2013. #2. Followup in 6 weeks with CBC and ferritin.  I appreciate the opportunity of sharing in her care.   Doroteo Bradford, MD 09/10/2013 7:00 PM   DISCLAIMER:  This note was dictated with voice recognition softwre.  Similar sounding words can inadvertently be transcribed inaccurately and may not be corrected upon review.

## 2013-09-10 NOTE — Patient Instructions (Addendum)
[facility] Discharge Instructions  RECOMMENDATIONS MADE BY THE CONSULTANT AND ANY TEST RESULTS WILL BE SENT TO YOUR REFERRING PHYSICIAN.  Return tomorrow for iron infusion; this will be given in divided doses (tomorrow and the following Friday). Return for blood work and office visit in 6 weeks.    Thank you for choosing [facility] to provide your oncology and hematology care.  To afford each patient quality time with our providers, please arrive at least 15 minutes before your scheduled appointment time.  With your help, our goal is to use those 15 minutes to complete the necessary work-up to ensure our physicians have the information they need to help with your evaluation and healthcare recommendations.    Effective January 1st, 2014, we ask that you re-schedule your appointment with our physicians should you arrive 10 or more minutes late for your appointment.  We strive to give you quality time with our providers, and arriving late affects you and other patients whose appointments are after yours.    Again, thank you for choosing [facility]  Our hope is that these requests will decrease the amount of time that you wait before being seen by our physicians.       _____________________________________________________________  Should you have questions after your visit to [facility], please contact our office at [phone] between the hours of 8:30 a.m. and 4:30 p.m.  Voicemails left after 4:30 p.m. will not be returned until the following business day.  For prescription refill requests, have your pharmacy contact our office with your prescription refill request.    _______________________________________________________________  We hope that we have given you very good care.  You may receive a patient satisfaction survey in the mail, please complete it and return it as soon as possible.  We value your  feedback!  _______________________________________________________________  Have you asked about our STAR program?  STAR stands for Survivorship Training and Rehabilitation, and this is a nationally recognized cancer care program that focuses on survivorship and rehabilitation.  Cancer and cancer treatments may cause problems, such as, pain, making you feel tired and keeping you from doing the things that you need or want to do. Cancer rehabilitation can help. Our goal is to reduce these troubling effects and help you have the best quality of life possible.  You may receive a survey from a nurse that asks questions about your current state of health.  Based on the survey results, all eligible patients will be referred to the [facility] program for an evaluation so we can better serve you!  A frequently asked questions sheet is available upon request.

## 2013-09-10 NOTE — Progress Notes (Signed)
April [name] presented for labwork. Labs per MD order drawn via Peripheral Line 21 gauge needle inserted in right antecubital  Good blood return present. Procedure without incident.  Needle removed intact. Patient tolerated procedure well.

## 2013-09-11 ENCOUNTER — Encounter (HOSPITAL_BASED_OUTPATIENT_CLINIC_OR_DEPARTMENT_OTHER): Payer: BC Managed Care – PPO

## 2013-09-11 ENCOUNTER — Other Ambulatory Visit (HOSPITAL_COMMUNITY): Payer: Self-pay | Admitting: Hematology and Oncology

## 2013-09-11 VITALS — BP 135/71 | HR 63 | Temp 98.2°F | Resp 18

## 2013-09-11 DIAGNOSIS — K9089 Other intestinal malabsorption: Secondary | ICD-10-CM

## 2013-09-11 DIAGNOSIS — Z9884 Bariatric surgery status: Secondary | ICD-10-CM

## 2013-09-11 DIAGNOSIS — D508 Other iron deficiency anemias: Secondary | ICD-10-CM

## 2013-09-11 LAB — VITAMIN B12: Vitamin B-12: 213 pg/mL (ref 211–911)

## 2013-09-11 LAB — FERRITIN: Ferritin: 5 ng/mL — ABNORMAL LOW (ref 10–291)

## 2013-09-11 LAB — VITAMIN D 25 HYDROXY (VIT D DEFICIENCY, FRACTURES): Vit D, 25-Hydroxy: 35 ng/mL (ref 30–89)

## 2013-09-11 LAB — FOLATE: Folate: 19.2 ng/mL

## 2013-09-11 MED ORDER — SODIUM CHLORIDE 0.9 % IV SOLN
510.0000 mg | Freq: Once | INTRAVENOUS | Status: AC
  Administered 2013-09-11: 510 mg via INTRAVENOUS
  Filled 2013-09-11: qty 17

## 2013-09-11 MED ORDER — SODIUM CHLORIDE 0.9 % IV SOLN
Freq: Once | INTRAVENOUS | Status: AC
  Administered 2013-09-11: 10:00:00 via INTRAVENOUS

## 2013-09-11 MED ORDER — SODIUM CHLORIDE 0.9 % IJ SOLN
10.0000 mL | INTRAMUSCULAR | Status: DC | PRN
  Administered 2013-09-11: 10 mL

## 2013-09-11 NOTE — Patient Instructions (Signed)
[facility] Discharge Instructions  RECOMMENDATIONS MADE BY THE CONSULTANT AND ANY TEST RESULTS WILL BE SENT TO YOUR REFERRING PHYSICIAN.  INSTRUCTIONS/FOLLOW-UP: Feraheme infusion today. Please report any post infusion problems or complaints. Return to clinic as scheduled for Feraheme infusion #2. Keep all other appointments as scheduled.  Thank you for choosing [facility] to provide your oncology and hematology care.  To afford each patient quality time with our providers, please arrive at least 15 minutes before your scheduled appointment time.  With your help, our goal is to use those 15 minutes to complete the necessary work-up to ensure our physicians have the information they need to help with your evaluation and healthcare recommendations.    Effective January 1st, 2014, we ask that you re-schedule your appointment with our physicians should you arrive 10 or more minutes late for your appointment.  We strive to give you quality time with our providers, and arriving late affects you and other patients whose appointments are after yours.    Again, thank you for choosing [facility].  Our hope is that these requests will decrease the amount of time that you wait before being seen by our physicians.       _____________________________________________________________  Should you have questions after your visit to [facility], please contact our office at [phone] between the hours of 8:30 a.m. and 4:30 p.m.  Voicemails left after 4:30 p.m. will not be returned until the following business day.  For prescription refill requests, have your pharmacy contact our office with your prescription refill request.    _______________________________________________________________  We hope that we have given you very good care.  You may receive a patient satisfaction survey in the mail, please complete it and return it as soon as  possible.  We value your feedback!  _______________________________________________________________  Have you asked about our STAR program?  STAR stands for Survivorship Training and Rehabilitation, and this is a nationally recognized cancer care program that focuses on survivorship and rehabilitation.  Cancer and cancer treatments may cause problems, such as, pain, making you feel tired and keeping you from doing the things that you need or want to do. Cancer rehabilitation can help. Our goal is to reduce these troubling effects and help you have the best quality of life possible.  You may receive a survey from a nurse that asks questions about your current state of health.  Based on the survey results, all eligible patients will be referred to the [facility] program for an evaluation so we can better serve you!  A frequently asked questions sheet is available upon request.

## 2013-09-11 NOTE — Progress Notes (Signed)
Tolerated well

## 2013-09-18 ENCOUNTER — Encounter (HOSPITAL_BASED_OUTPATIENT_CLINIC_OR_DEPARTMENT_OTHER): Payer: BC Managed Care – PPO

## 2013-09-18 VITALS — BP 116/69 | HR 62 | Temp 98.1°F | Resp 18

## 2013-09-18 DIAGNOSIS — Z9884 Bariatric surgery status: Secondary | ICD-10-CM

## 2013-09-18 DIAGNOSIS — D508 Other iron deficiency anemias: Secondary | ICD-10-CM

## 2013-09-18 DIAGNOSIS — K9089 Other intestinal malabsorption: Secondary | ICD-10-CM

## 2013-09-18 MED ORDER — SODIUM CHLORIDE 0.9 % IV SOLN
Freq: Once | INTRAVENOUS | Status: AC
  Administered 2013-09-18: 10:00:00 via INTRAVENOUS

## 2013-09-18 MED ORDER — SODIUM CHLORIDE 0.9 % IJ SOLN: 3.0000 mL | Freq: Once | INTRAMUSCULAR | Status: DC | PRN

## 2013-09-18 MED ORDER — SODIUM CHLORIDE 0.9 % IV SOLN
510.0000 mg | Freq: Once | INTRAVENOUS | Status: AC
  Administered 2013-09-18: 510 mg via INTRAVENOUS
  Filled 2013-09-18: qty 17

## 2013-09-18 NOTE — Progress Notes (Signed)
Patient tolerated infusion well.

## 2013-09-18 NOTE — Patient Instructions (Signed)
[facility] Discharge Instructions  RECOMMENDATIONS MADE BY THE CONSULTANT AND ANY TEST RESULTS WILL BE SENT TO YOUR REFERRING PHYSICIAN. You were given feraheme today.  Please call for any questions or concerns. Follow up with the clinic as scheduled.   Thank you for choosing [facility] to provide your oncology and hematology care.  To afford each patient quality time with our providers, please arrive at least 15 minutes before your scheduled appointment time.  With your help, our goal is to use those 15 minutes to complete the necessary work-up to ensure our physicians have the information they need to help with your evaluation and healthcare recommendations.    Effective January 1st, 2014, we ask that you re-schedule your appointment with our physicians should you arrive 10 or more minutes late for your appointment.  We strive to give you quality time with our providers, and arriving late affects you and other patients whose appointments are after yours.    Again, thank you for choosing [facility].  Our hope is that these requests will decrease the amount of time that you wait before being seen by our physicians.       _____________________________________________________________  Should you have questions after your visit to [facility], please contact our office at [phone] between the hours of 8:30 a.m. and 4:30 p.m.  Voicemails left after 4:30 p.m. will not be returned until the following business day.  For prescription refill requests, have your pharmacy contact our office with your prescription refill request.    _______________________________________________________________  We hope that we have given you very good care.  You may receive a patient satisfaction survey in the mail, please complete it and return it as soon as possible.  We value your  feedback!  _______________________________________________________________  Have you asked about our STAR program?  STAR stands for Survivorship Training and Rehabilitation, and this is a nationally recognized cancer care program that focuses on survivorship and rehabilitation.  Cancer and cancer treatments may cause problems, such as, pain, making you feel tired and keeping you from doing the things that you need or want to do. Cancer rehabilitation can help. Our goal is to reduce these troubling effects and help you have the best quality of life possible.  You may receive a survey from a nurse that asks questions about your current state of health.  Based on the survey results, all eligible patients will be referred to the [facility] program for an evaluation so we can better serve you!  A frequently asked questions sheet is available upon request.

## 2013-09-25 ENCOUNTER — Other Ambulatory Visit: Payer: Self-pay | Admitting: Internal Medicine

## 2013-09-25 DIAGNOSIS — Z139 Encounter for screening, unspecified: Secondary | ICD-10-CM

## 2013-10-02 ENCOUNTER — Ambulatory Visit (HOSPITAL_COMMUNITY)
Admission: RE | Admit: 2013-10-02 | Discharge: 2013-10-02 | Disposition: A | Discharge status: Home / Self Care | Payer: BC Managed Care – PPO | Source: Ambulatory Visit | Attending: Internal Medicine | Admitting: Internal Medicine

## 2013-10-02 DIAGNOSIS — Z139 Encounter for screening, unspecified: Secondary | ICD-10-CM

## 2013-10-02 DIAGNOSIS — Z1231 Encounter for screening mammogram for malignant neoplasm of breast: Secondary | ICD-10-CM | POA: Diagnosis present

## 2013-10-22 ENCOUNTER — Encounter (HOSPITAL_COMMUNITY): Payer: BC Managed Care – PPO

## 2013-10-22 ENCOUNTER — Encounter (HOSPITAL_COMMUNITY): Payer: Self-pay

## 2013-10-22 ENCOUNTER — Encounter (HOSPITAL_COMMUNITY): Payer: BC Managed Care – PPO | Attending: Hematology and Oncology

## 2013-10-22 VITALS — HR 76 | Temp 97.8°F | Resp 18 | Wt 226.9 lb

## 2013-10-22 DIAGNOSIS — E538 Deficiency of other specified B group vitamins: Secondary | ICD-10-CM | POA: Insufficient documentation

## 2013-10-22 DIAGNOSIS — K912 Postsurgical malabsorption, not elsewhere classified: Secondary | ICD-10-CM | POA: Diagnosis not present

## 2013-10-22 DIAGNOSIS — R5383 Other fatigue: Secondary | ICD-10-CM | POA: Insufficient documentation

## 2013-10-22 DIAGNOSIS — Z87891 Personal history of nicotine dependence: Secondary | ICD-10-CM | POA: Diagnosis not present

## 2013-10-22 DIAGNOSIS — E039 Hypothyroidism, unspecified: Secondary | ICD-10-CM | POA: Diagnosis not present

## 2013-10-22 DIAGNOSIS — Z9884 Bariatric surgery status: Secondary | ICD-10-CM | POA: Diagnosis not present

## 2013-10-22 DIAGNOSIS — D508 Other iron deficiency anemias: Secondary | ICD-10-CM | POA: Diagnosis not present

## 2013-10-22 DIAGNOSIS — K9589 Other complications of other bariatric procedure: Secondary | ICD-10-CM | POA: Insufficient documentation

## 2013-10-22 DIAGNOSIS — K909 Intestinal malabsorption, unspecified: Secondary | ICD-10-CM

## 2013-10-22 DIAGNOSIS — E611 Iron deficiency: Secondary | ICD-10-CM

## 2013-10-22 DIAGNOSIS — F329 Major depressive disorder, single episode, unspecified: Secondary | ICD-10-CM | POA: Diagnosis not present

## 2013-10-22 LAB — CBC WITH DIFFERENTIAL/PLATELET
Basophils Absolute: 0 10*3/uL (ref 0.0–0.1)
Basophils Relative: 0 % (ref 0–1)
EOS ABS: 0.1 10*3/uL (ref 0.0–0.7)
Eosinophils Relative: 2 % (ref 0–5)
HEMATOCRIT: 36.5 % (ref 36.0–46.0)
Hemoglobin: 12.4 g/dL (ref 12.0–15.0)
LYMPHS ABS: 1.7 10*3/uL (ref 0.7–4.0)
LYMPHS PCT: 32 % (ref 12–46)
MCH: 32.5 pg (ref 26.0–34.0)
MCHC: 34 g/dL (ref 30.0–36.0)
MCV: 95.5 fL (ref 78.0–100.0)
MONO ABS: 0.4 10*3/uL (ref 0.1–1.0)
MONOS PCT: 7 % (ref 3–12)
Neutro Abs: 3.1 10*3/uL (ref 1.7–7.7)
Neutrophils Relative %: 59 % (ref 43–77)
Platelets: 339 10*3/uL (ref 150–400)
RBC: 3.82 MIL/uL — AB (ref 3.87–5.11)
RDW: 14 % (ref 11.5–15.5)
WBC: 5.2 10*3/uL (ref 4.0–10.5)

## 2013-10-22 NOTE — Progress Notes (Signed)
Aram BeechamCynthia A Zacarias's reason for visit today is for labs as scheduled per MD orders.  Venipuncture performed with a 23 gauge butterfly needle to R Antecubital.  Aram Beechamynthia A Calzada tolerated procedure well and without incident; questions were answered and patient was discharged.

## 2013-10-22 NOTE — Patient Instructions (Signed)
[facility] Discharge Instructions  RECOMMENDATIONS MADE BY THE CONSULTANT AND ANY TEST RESULTS WILL BE SENT TO YOUR REFERRING PHYSICIAN.  EXAM FINDINGS BY THE PHYSICIAN TODAY AND SIGNS OR SYMPTOMS TO REPORT TO CLINIC OR PRIMARY PHYSICIAN: You saw Dr Zigmund DanielFormanek today  MEDICATIONS PRESCRIBED:  No new medications  INSTRUCTIONS GIVEN AND DISCUSSED: We will call you if your iron is low to schedule iron infusion.  We are going to test your thyroid function today also.  SPECIAL INSTRUCTIONS/FOLLOW-UP: Return visit in 4 months with lab work.  Thank you for choosing [facility] to provide your oncology and hematology care.  To afford each patient quality time with our providers, please arrive at least 15 minutes before your scheduled appointment time.  With your help, our goal is to use those 15 minutes to complete the necessary work-up to ensure our physicians have the information they need to help with your evaluation and healthcare recommendations.    Effective January 1st, 2014, we ask that you re-schedule your appointment with our physicians should you arrive 10 or more minutes late for your appointment.  We strive to give you quality time with our providers, and arriving late affects you and other patients whose appointments are after yours.    Again, thank you for choosing [facility].  Our hope is that these requests will decrease the amount of time that you wait before being seen by our physicians.       _____________________________________________________________  Should you have questions after your visit to [facility], please contact our office at [phone] between the hours of 8:30 a.m. and 5:00 p.m.  Voicemails left after 4:30 p.m. will not be returned until the following business day.  For prescription refill requests, have your pharmacy contact our office with your prescription refill request.

## 2013-10-22 NOTE — Progress Notes (Signed)
[facility]  OFFICE PROGRESS NOTE  [name], [name], DO [address] [place] [id number]  DIAGNOSIS: Iron deficiency - Plan: TSH, TSH, CBC with Differential, Ferritin  Iron malabsorption  H/O gastric bypass  Chief Complaint  Patient presents with  . Iron deficiency    CURRENT THERAPY: IV Feraheme 09/11/2013 and 09/22/2013. Synthroid 50 mcg daily  INTERVAL HISTORY: April LewandowskyCynthia A [name] 58 y.o. female returns for followup of iron deficiency after receiving intravenous iron therapy. She also takes vitamin B12 100 mcg daily sublingually. She continues to work as a Education officer, communitydentist. She still feels quite fatigued. She denies a fever, night sweats, peripheral paresthesias, constipation, diarrhea, cough, wheezing, sore throat, skin rash, headache, or seizures.  MEDICAL HISTORY: Past Medical History  Diagnosis Date  . Anemia   . Hypothyroidism   . Chronic back pain     INTERIM HISTORY: has Postoperative malabsorption; Encounter for screening colonoscopy; Anemia; Malabsorption of iron; and H/O gastric bypass on her problem list.    ALLERGIES:  has No Known Allergies.  MEDICATIONS: has a current medication list which includes the following prescription(s): acetaminophen, cholecalciferol, escitalopram, levothyroxine, multivitamin with minerals, vitamin b-12, and ibuprofen.  SURGICAL HISTORY:  Past Surgical History  Procedure Laterality Date  . [place]      [place]  . Cesarean section   x 2  . Esophagogastroduodenoscopy    . Colonoscopy N/A 08/07/2013    Procedure: COLONOSCOPY;  Surgeon: West BaliSandi L Fields, MD;  Location: [facility];  Service: Endoscopy;  Laterality: N/A;  10:30-moved to [address] notified pt  . Esophagogastroduodenoscopy N/A 08/07/2013    Procedure: ESOPHAGOGASTRODUODENOSCOPY (EGD);  Surgeon: West BaliSandi L Fields, MD;  Location: [facility];  Service: Endoscopy;  Laterality: N/A;    FAMILY HISTORY: family history includes  Diabetes in her father and mother; Heart disease in her father and mother; Hypertension in her father and mother; Stroke in her mother. There is no history of Colon cancer.  SOCIAL HISTORY:  reports that she has quit smoking. Her smoking use included Cigarettes. She has a 2.5 pack-year smoking history. She does not have any smokeless tobacco history on file. She reports that she does not drink alcohol or use illicit drugs.  REVIEW OF SYSTEMS:  Other than that discussed above is noncontributory.  PHYSICAL EXAMINATION: ECOG PERFORMANCE STATUS: 1 - Symptomatic but completely ambulatory  Pulse 76, temperature 97.8 F (36.6 C), temperature source Oral, resp. rate 18, weight 226 lb 14.4 oz (102.921 kg), SpO2 99.00%.  GENERAL:alert, no distress and comfortable SKIN: skin color, texture, turgor are normal, no rashes or significant lesions EYES: PERLA; Conjunctiva are pink and non-injected, sclera clear SINUSES: No redness or tenderness over maxillary or ethmoid sinuses OROPHARYNX:no exudate, no erythema on lips, buccal mucosa, or tongue. NECK: supple, thyroid normal size, non-tender, without nodularity. No masses CHEST: Normal AP diameter. LYMPH:  no palpable lymphadenopathy in the cervical, axillary or inguinal LUNGS: clear to auscultation and percussion with normal breathing effort HEART: regular rate & rhythm and no murmurs. ABDOMEN:abdomen soft, non-tender and normal bowel sounds MUSCULOSKELETAL:no cyanosis of digits and no clubbing. Range of motion normal.  NEURO: alert & oriented x 3 with fluent speech, no focal motor/sensory deficits. Normal DTRs.   LABORATORY DATA: Appointment on 10/22/2013  Component Date Value Ref Range Status  . WBC 10/22/2013 5.2  4.0 - 10.5 K/uL Final  . RBC 10/22/2013 3.82* 3.87 - 5.11 MIL/uL Final  .  Hemoglobin 10/22/2013 12.4  12.0 - 15.0 g/dL Final  . HCT 40/98/119110/15/2015 36.5  36.0 - 46.0 % Final  . MCV 10/22/2013 95.5  78.0 - 100.0 fL Final  . MCH 10/22/2013  32.5  26.0 - 34.0 pg Final  . MCHC 10/22/2013 34.0  30.0 - 36.0 g/dL Final  . RDW 47/82/956210/15/2015 14.0  11.5 - 15.5 % Final  . Platelets 10/22/2013 339  150 - 400 K/uL Final  . Neutrophils Relative % 10/22/2013 59  43 - 77 % Final  . Neutro Abs 10/22/2013 3.1  1.7 - 7.7 K/uL Final  . Lymphocytes Relative 10/22/2013 32  12 - 46 % Final  . Lymphs Abs 10/22/2013 1.7  0.7 - 4.0 K/uL Final  . Monocytes Relative 10/22/2013 7  3 - 12 % Final  . Monocytes Absolute 10/22/2013 0.4  0.1 - 1.0 K/uL Final  . Eosinophils Relative 10/22/2013 2  0 - 5 % Final  . Eosinophils Absolute 10/22/2013 0.1  0.0 - 0.7 K/uL Final  . Basophils Relative 10/22/2013 0  0 - 1 % Final  . Basophils Absolute 10/22/2013 0.0  0.0 - 0.1 K/uL Final    PATHOLOGY: No new pathology.  Urinalysis No results found for this basename: colorurine,  appearanceur,  labspec,  phurine,  glucoseu,  hgbur,  bilirubinur,  ketonesur,  proteinur,  urobilinogen,  nitrite,  leukocytesur    RADIOGRAPHIC STUDIES: Mm Digital Screening Bilateral  10/05/2013   CLINICAL DATA:  Screening.  EXAM: DIGITAL SCREENING BILATERAL MAMMOGRAM WITH CAD  COMPARISON:  Previous exam(s)  ACR Breast Density Category a: The breast tissue is almost entirely fatty.  FINDINGS: There are no findings suspicious for malignancy. Images were processed with CAD.  IMPRESSION: No mammographic evidence of malignancy. A result letter of this screening mammogram will be mailed directly to the patient.  RECOMMENDATION: Screening mammogram in one year. (Code:SM-B-01Y)  BI-RADS CATEGORY  1: Negative.   Electronically Signed   By: Christiana PellantGretchen  Green M.D.   On: 10/05/2013 09:47    ASSESSMENT:  #1. Iron deficiency secondary to malabsorption due to previous gastric bypass surgery, status post iron infusion, awaiting today's ferritin report. #2. Depression, on treatment. #3. Hypothyroidism, on 50 mcg of levothyroxine for many years, not checked recently.   PLAN:  #1 heck TSH and is elevated,  but does present dose of levothyroxine will be made. #2. Followup in 4 months with CBC and ferritin but sooner if levothyroxine dosage change.   All questions were answered. The patient knows to call the clinic with any problems, questions or concerns. We can certainly see the patient much sooner if necessary.   I spent 25 minutes counseling the patient face to face. The total time spent in the appointment was 30 minutes.    Maurilio LovelyFormanek, Joeleen Wortley A, MD 10/22/2013 7:25 PM  DISCLAIMER:  This note was dictated with voice recognition software.  Similar sounding words can inadvertently be transcribed inaccurately and may not be corrected upon review.

## 2013-10-23 ENCOUNTER — Other Ambulatory Visit (HOSPITAL_COMMUNITY): Payer: BC Managed Care – PPO

## 2013-10-23 ENCOUNTER — Ambulatory Visit (HOSPITAL_BASED_OUTPATIENT_CLINIC_OR_DEPARTMENT_OTHER): Payer: BC Managed Care – PPO

## 2013-10-23 DIAGNOSIS — Z9884 Bariatric surgery status: Secondary | ICD-10-CM

## 2013-10-23 DIAGNOSIS — E611 Iron deficiency: Secondary | ICD-10-CM

## 2013-10-23 DIAGNOSIS — K909 Intestinal malabsorption, unspecified: Secondary | ICD-10-CM

## 2013-10-23 LAB — TSH: TSH: 1.97 u[IU]/mL (ref 0.350–4.500)

## 2013-10-23 LAB — VITAMIN B12: Vitamin B-12: 663 pg/mL (ref 211–911)

## 2013-10-23 LAB — FERRITIN: Ferritin: 92 ng/mL (ref 10–291)

## 2013-10-24 NOTE — Progress Notes (Signed)
duplicate

## 2014-02-22 ENCOUNTER — Other Ambulatory Visit (HOSPITAL_COMMUNITY): Payer: BC Managed Care – PPO

## 2014-02-24 ENCOUNTER — Ambulatory Visit (HOSPITAL_COMMUNITY): Payer: BC Managed Care – PPO | Admitting: Hematology & Oncology

## 2014-02-24 ENCOUNTER — Other Ambulatory Visit (HOSPITAL_COMMUNITY): Payer: BC Managed Care – PPO

## 2014-02-26 ENCOUNTER — Encounter (HOSPITAL_COMMUNITY): Payer: Self-pay | Admitting: Hematology & Oncology

## 2014-02-26 ENCOUNTER — Encounter (HOSPITAL_COMMUNITY): Payer: BLUE CROSS/BLUE SHIELD | Attending: Hematology & Oncology

## 2014-02-26 ENCOUNTER — Encounter (HOSPITAL_BASED_OUTPATIENT_CLINIC_OR_DEPARTMENT_OTHER): Payer: BLUE CROSS/BLUE SHIELD | Admitting: Hematology & Oncology

## 2014-02-26 VITALS — BP 129/75 | HR 56 | Temp 97.9°F | Resp 16 | Wt 227.2 lb

## 2014-02-26 DIAGNOSIS — K909 Intestinal malabsorption, unspecified: Secondary | ICD-10-CM

## 2014-02-26 DIAGNOSIS — Z9884 Bariatric surgery status: Secondary | ICD-10-CM

## 2014-02-26 DIAGNOSIS — D509 Iron deficiency anemia, unspecified: Secondary | ICD-10-CM

## 2014-02-26 DIAGNOSIS — E611 Iron deficiency: Secondary | ICD-10-CM

## 2014-02-26 LAB — FERRITIN: FERRITIN: 62 ng/mL (ref 10–291)

## 2014-02-26 LAB — CBC WITH DIFFERENTIAL/PLATELET
Basophils Absolute: 0 10*3/uL (ref 0.0–0.1)
Basophils Relative: 0 % (ref 0–1)
EOS ABS: 0.2 10*3/uL (ref 0.0–0.7)
Eosinophils Relative: 3 % (ref 0–5)
HEMATOCRIT: 36.9 % (ref 36.0–46.0)
Hemoglobin: 12.2 g/dL (ref 12.0–15.0)
Lymphocytes Relative: 37 % (ref 12–46)
Lymphs Abs: 1.9 10*3/uL (ref 0.7–4.0)
MCH: 33.1 pg (ref 26.0–34.0)
MCHC: 33.1 g/dL (ref 30.0–36.0)
MCV: 100 fL (ref 78.0–100.0)
Monocytes Absolute: 0.3 10*3/uL (ref 0.1–1.0)
Monocytes Relative: 6 % (ref 3–12)
NEUTROS ABS: 2.8 10*3/uL (ref 1.7–7.7)
Neutrophils Relative %: 54 % (ref 43–77)
Platelets: 322 10*3/uL (ref 150–400)
RBC: 3.69 MIL/uL — ABNORMAL LOW (ref 3.87–5.11)
RDW: 12.3 % (ref 11.5–15.5)
WBC: 5.2 10*3/uL (ref 4.0–10.5)

## 2014-02-26 NOTE — Patient Instructions (Signed)
[facility] Discharge Instructions  RECOMMENDATIONS MADE BY THE CONSULTANT AND ANY TEST RESULTS WILL BE SENT TO YOUR REFERRING PHYSICIAN.  Exam and discussion by Dr. Galen ManilaPenland We will let you know about your ferritin level. Report increased fatigue, shortness of breath or other concerns.  Follow-up:  Labs in 3 and 6 months Office visit in 6 months.  Thank you for choosing [facility] to provide your oncology and hematology care.  To afford each patient quality time with our provider, please arrive at least 15 minutes before your scheduled appointment time.    You need to re-schedule your appointment should you arrive 10 or more minutes late.  We strive to give you quality time with our providers, and arriving late affects you and other patients whose appointments are after yours.  Also, if you no show three or more times for appointments you may be dismissed from the clinic at the providers discretion.     Again, thank you for choosing [facility].  Our hope is that these requests will decrease the amount of time that you wait before being seen by our physicians.       _____________________________________________________________  Should you have questions after your visit to [facility], please contact our office at [phone] between the hours of 8:30 a.m. and 4:30 p.m.  Voicemails left after 4:30 p.m. will not be returned until the following business day.  For prescription refill requests, have your pharmacy contact our office.

## 2014-02-26 NOTE — Progress Notes (Signed)
LABS FOR CB[facility],FERR  

## 2014-02-26 NOTE — Progress Notes (Signed)
[name], [name], DO [address] [place] [id number]    DIAGNOSIS: Iron deficiency anemia secondary to malabsorption   History of gastric bypass   B12 deficiency, on SL B12   Colonoscopy on 08/07/2013 with a large redundant colon and    internal/external hemorrhoids   EGD on 08/07/2013 with a Schatzki ring at the GE junction and small   clean-based ulcer at anastomosis  CURRENT THERAPY: IV iron   INTERVAL HISTORY: April [name] 59 y.o. female returns for iron deficiency anemia. She has a history of gastric bypass. She takes a B12 supplement sublingually. She is up-to-date on well care including her mammograms.   MEDICAL HISTORY: Past Medical History  Diagnosis Date  . Anemia   . Hypothyroidism   . Chronic back pain     has Postoperative malabsorption; Encounter for screening colonoscopy; Anemia; Malabsorption of iron; and H/O gastric bypass on her problem list.     has No Known Allergies.  April [name] does not currently have medications on file.  SURGICAL HISTORY: Past Surgical History  Procedure Laterality Date  . [place]      [place]  . Cesarean section   x 2  . Esophagogastroduodenoscopy    . Colonoscopy N/A 08/07/2013    Procedure: COLONOSCOPY;  Surgeon: West BaliSandi L Fields, MD;  [facility];  Service: Endoscopy;  Laterality: N/A;  10:30-moved to [address] notified pt  . Esophagogastroduodenoscopy N/A 08/07/2013    Procedure: ESOPHAGOGASTRODUODENOSCOPY (EGD);  Surgeon: West BaliSandi L Fields, MD;  Location: [facility];  Service: Endoscopy;  Laterality: N/A;    SOCIAL HISTORY: History   Social History  . Marital Status: Married    Spouse Name: N/A  . Number of Children: N/A  . Years of Education: N/A   Occupational History  . Dentist    Social History Main Topics  . Smoking status: Former Smoker -- 0.25 packs/day for 10 years    Types: Cigarettes  . Smokeless tobacco: Not on file  . Alcohol Use: No  . Drug Use: No  . Sexual  Activity: Not on file   Other Topics Concern  . Not on file   Social History Narrative  . No narrative on file    FAMILY HISTORY: Family History  Problem Relation Age of Onset  . Heart disease Mother   . Diabetes Mother   . Stroke Mother   . Hypertension Mother   . Diabetes Father   . Heart disease Father   . Hypertension Father   . Colon cancer Neg Hx     Review of Systems  Constitutional: Negative for fever, chills, weight loss and malaise/fatigue.  HENT: Negative for congestion, hearing loss, nosebleeds, sore throat and tinnitus.   Eyes: Negative for blurred vision, double vision, pain and discharge.  Respiratory: Negative for cough, hemoptysis, sputum production, shortness of breath and wheezing.   Cardiovascular: Negative for chest pain, palpitations, claudication, leg swelling and PND.  Gastrointestinal: Negative for heartburn, nausea, vomiting, abdominal pain, diarrhea, constipation, blood in stool and melena.  Genitourinary: Negative for dysuria, urgency, frequency and hematuria.  Musculoskeletal: Negative for myalgias, joint pain and falls.  Skin: Negative for itching and rash.  Neurological: Negative for dizziness, tingling, tremors, sensory change, speech change, focal weakness, seizures, loss of consciousness, weakness and headaches.  Endo/Heme/Allergies: Does not bruise/bleed easily.  Psychiatric/Behavioral: Negative for depression, suicidal ideas, memory loss and substance abuse. The patient is not nervous/anxious and does not have insomnia.  PHYSICAL EXAMINATION  ECOG PERFORMANCE STATUS: 0 - Asymptomatic  There were no vitals filed for this visit.  Physical Exam  Constitutional: She is oriented to person, place, and time and well-developed, well-nourished, and in no distress.  HENT:  Head: Normocephalic and atraumatic.  Nose: Nose normal.  Mouth/Throat: Oropharynx is clear and moist. No oropharyngeal exudate.  Eyes: Conjunctivae and EOM are normal.  Pupils are equal, round, and reactive to light. Right eye exhibits no discharge. Left eye exhibits no discharge. No scleral icterus.  Neck: Normal range of motion. Neck supple. No tracheal deviation present. No thyromegaly present.  Cardiovascular: Normal rate, regular rhythm and normal heart sounds.  Exam reveals no gallop and no friction rub.   No murmur heard. Pulmonary/Chest: Effort normal and breath sounds normal. She has no wheezes. She has no rales.  Abdominal: Soft. Bowel sounds are normal. She exhibits no distension and no mass. There is no tenderness. There is no rebound and no guarding.  Musculoskeletal: Normal range of motion. She exhibits no edema.  Lymphadenopathy:    She has no cervical adenopathy.  Neurological: She is alert and oriented to person, place, and time. She has normal reflexes. No cranial nerve deficit. Gait normal. Coordination normal.  Skin: Skin is warm and dry. No rash noted.  Psychiatric: Mood, memory, affect and judgment normal.  Nursing note and vitals reviewed.   LABORATORY DATA:  CBC    Component Value Date/Time   WBC 5.2 10/22/2013 1532   RBC 3.82* 10/22/2013 1532   HGB 12.4 10/22/2013 1532   HCT 36.5 10/22/2013 1532   PLT 339 10/22/2013 1532   MCV 95.5 10/22/2013 1532   MCH 32.5 10/22/2013 1532   MCHC 34.0 10/22/2013 1532   RDW 14.0 10/22/2013 1532   LYMPHSABS 1.7 10/22/2013 1532   MONOABS 0.4 10/22/2013 1532   EOSABS 0.1 10/22/2013 1532   BASOSABS 0.0 10/22/2013 1532   CMP     Component Value Date/Time   NA 144 09/10/2013 1530   K 3.2* 09/10/2013 1530   CL 105 09/10/2013 1530   CO2 28 09/10/2013 1530   GLUCOSE 94 09/10/2013 1530   BUN 10 09/10/2013 1530   CREATININE 0.83 09/10/2013 1530   CALCIUM 9.0 09/10/2013 1530   PROT 7.0 09/10/2013 1530   ALBUMIN 3.6 09/10/2013 1530   AST 25 09/10/2013 1530   ALT 19 09/10/2013 1530   ALKPHOS 103 09/10/2013 1530   BILITOT 0.3 09/10/2013 1530   GFRNONAA 77* 09/10/2013 1530   GFRAA 89*  09/10/2013 1530       ASSESSMENT and THERAPY PLAN:   Iron deficiency  59 year old female with a history of gastric bypass and resultant iron deficiency secondary to malabsorption. I advised her we will call her with her iron levels and if she needs additional IV iron replacement we will schedule that. We will continue with ongoing observation following both her CBC and iron levels. She is to continue on her B12 supplementation. She is up-to-date on her well care.   All questions were answered. The patient knows to call the clinic with any problems, questions or concerns. We can certainly see the patient much sooner if necessary. This note was electronically signed. Arvil Chaco 02/26/2014

## 2014-03-04 ENCOUNTER — Telehealth (HOSPITAL_COMMUNITY): Payer: Self-pay

## 2014-03-04 NOTE — Telephone Encounter (Signed)
Lab results given per patients request.  Wants to know if she will need any feraheme as her ferritin is 62.

## 2014-03-05 NOTE — Telephone Encounter (Signed)
Discussed with Dr. Galen ManilaPenland and patient does need to come for feraheme infusion.  Per patient's request, scheduled for 03/17/14.

## 2014-03-09 ENCOUNTER — Other Ambulatory Visit (HOSPITAL_COMMUNITY): Payer: Self-pay | Admitting: Hematology & Oncology

## 2014-03-12 ENCOUNTER — Encounter (HOSPITAL_COMMUNITY): Payer: BLUE CROSS/BLUE SHIELD | Attending: Hematology & Oncology

## 2014-03-12 DIAGNOSIS — Z9884 Bariatric surgery status: Secondary | ICD-10-CM

## 2014-03-12 DIAGNOSIS — E611 Iron deficiency: Secondary | ICD-10-CM

## 2014-03-12 DIAGNOSIS — K909 Intestinal malabsorption, unspecified: Secondary | ICD-10-CM | POA: Insufficient documentation

## 2014-03-12 DIAGNOSIS — D509 Iron deficiency anemia, unspecified: Secondary | ICD-10-CM | POA: Insufficient documentation

## 2014-03-12 MED ORDER — SODIUM CHLORIDE 0.9 % IV SOLN
Freq: Once | INTRAVENOUS | Status: AC
  Administered 2014-03-12: 15:00:00 via INTRAVENOUS

## 2014-03-12 MED ORDER — SODIUM CHLORIDE 0.9 % IJ SOLN
125.0000 mg | Freq: Once | INTRAVENOUS | Status: AC
  Administered 2014-03-12: 125 mg via INTRAVENOUS
  Filled 2014-03-12: qty 10

## 2014-03-12 NOTE — Patient Instructions (Signed)
[facility] at [facility] Discharge Instructions  RECOMMENDATIONS MADE BY THE CONSULTANT AND ANY TEST RESULTS WILL BE SENT TO YOUR REFERRING PHYSICIAN.  Today you received iron infusion (ferric gluconate). Return as scheduled for blood work and office visit.  Thank you for choosing [facility] at [facility] to provide your oncology and hematology care.  To afford each patient quality time with our provider, please arrive at least 15 minutes before your scheduled appointment time.    You need to re-schedule your appointment should you arrive 10 or more minutes late.  We strive to give you quality time with our providers, and arriving late affects you and other patients whose appointments are after yours.  Also, if you no show three or more times for appointments you may be dismissed from the clinic at the providers discretion.     Again, thank you for choosing [facility].  Our hope is that these requests will decrease the amount of time that you wait before being seen by our physicians.       _____________________________________________________________  Should you have questions after your visit to [facility], please contact our office at [phone] between the hours of 8:30 a.m. and 4:30 p.m.  Voicemails left after 4:30 p.m. will not be returned until the following business day.  For prescription refill requests, have your pharmacy contact our office.

## 2014-05-28 ENCOUNTER — Encounter (HOSPITAL_COMMUNITY): Payer: BLUE CROSS/BLUE SHIELD | Attending: Hematology & Oncology

## 2014-05-28 DIAGNOSIS — D509 Iron deficiency anemia, unspecified: Secondary | ICD-10-CM

## 2014-05-28 DIAGNOSIS — K909 Intestinal malabsorption, unspecified: Secondary | ICD-10-CM

## 2014-05-28 LAB — CBC WITH DIFFERENTIAL/PLATELET
BASOS PCT: 1 % (ref 0–1)
Basophils Absolute: 0 10*3/uL (ref 0.0–0.1)
EOS PCT: 1 % (ref 0–5)
Eosinophils Absolute: 0.1 10*3/uL (ref 0.0–0.7)
HCT: 35.4 % — ABNORMAL LOW (ref 36.0–46.0)
Hemoglobin: 11.6 g/dL — ABNORMAL LOW (ref 12.0–15.0)
Lymphocytes Relative: 29 % (ref 12–46)
Lymphs Abs: 1.1 10*3/uL (ref 0.7–4.0)
MCH: 32.2 pg (ref 26.0–34.0)
MCHC: 32.8 g/dL (ref 30.0–36.0)
MCV: 98.3 fL (ref 78.0–100.0)
Monocytes Absolute: 0.3 10*3/uL (ref 0.1–1.0)
Monocytes Relative: 8 % (ref 3–12)
Neutro Abs: 2.3 10*3/uL (ref 1.7–7.7)
Neutrophils Relative %: 61 % (ref 43–77)
PLATELETS: 311 10*3/uL (ref 150–400)
RBC: 3.6 MIL/uL — ABNORMAL LOW (ref 3.87–5.11)
RDW: 12.3 % (ref 11.5–15.5)
WBC: 3.8 10*3/uL — ABNORMAL LOW (ref 4.0–10.5)

## 2014-05-28 LAB — IRON AND TIBC
Iron: 101 ug/dL (ref 28–170)
Saturation Ratios: 36 % — ABNORMAL HIGH (ref 10.4–31.8)
TIBC: 283 ug/dL (ref 250–450)
UIBC: 182 ug/dL

## 2014-05-28 LAB — FERRITIN: Ferritin: 43 ng/mL (ref 11–307)

## 2014-05-28 LAB — COMPREHENSIVE METABOLIC PANEL
ALT: 23 U/L (ref 14–54)
AST: 21 U/L (ref 15–41)
Albumin: 3.5 g/dL (ref 3.5–5.0)
Alkaline Phosphatase: 95 U/L (ref 38–126)
Anion gap: 7 (ref 5–15)
BUN: 10 mg/dL (ref 6–20)
CALCIUM: 8.3 mg/dL — AB (ref 8.9–10.3)
CO2: 28 mmol/L (ref 22–32)
CREATININE: 0.76 mg/dL (ref 0.44–1.00)
Chloride: 106 mmol/L (ref 101–111)
GFR calc non Af Amer: >60 mL/min (ref >60)
GLUCOSE: 85 mg/dL (ref 65–99)
Potassium: 3.8 mmol/L (ref 3.5–5.1)
SODIUM: 141 mmol/L (ref 135–145)
TOTAL PROTEIN: 6.3 g/dL — AB (ref 6.5–8.1)
Total Bilirubin: 0.5 mg/dL (ref 0.3–1.2)

## 2014-05-28 NOTE — Progress Notes (Signed)
Labs drawn

## 2014-06-06 ENCOUNTER — Other Ambulatory Visit (HOSPITAL_COMMUNITY): Payer: Self-pay | Admitting: Hematology & Oncology

## 2014-06-09 ENCOUNTER — Telehealth (HOSPITAL_COMMUNITY): Payer: Self-pay | Admitting: Hematology & Oncology

## 2014-06-09 NOTE — Telephone Encounter (Signed)
PC TO [employer] [phone] PER LATOYA D [id number] DOES NOT REQUIRE AUTH

## 2014-06-18 ENCOUNTER — Encounter (HOSPITAL_COMMUNITY): Payer: Self-pay

## 2014-06-18 ENCOUNTER — Encounter (HOSPITAL_COMMUNITY): Payer: BLUE CROSS/BLUE SHIELD | Attending: Hematology & Oncology

## 2014-06-18 DIAGNOSIS — Z9884 Bariatric surgery status: Secondary | ICD-10-CM

## 2014-06-18 DIAGNOSIS — K909 Intestinal malabsorption, unspecified: Secondary | ICD-10-CM

## 2014-06-18 DIAGNOSIS — D509 Iron deficiency anemia, unspecified: Secondary | ICD-10-CM

## 2014-06-18 MED ORDER — SODIUM CHLORIDE 0.9 % IV SOLN
INTRAVENOUS | Status: DC
  Administered 2014-06-18: 15:00:00 via INTRAVENOUS

## 2014-06-18 MED ORDER — SODIUM CHLORIDE 0.9 % IV SOLN
510.0000 mg | Freq: Once | INTRAVENOUS | Status: AC
  Administered 2014-06-18: 510 mg via INTRAVENOUS
  Filled 2014-06-18: qty 17

## 2014-06-18 NOTE — Patient Instructions (Signed)
[facility] at [facility] Discharge Instructions  RECOMMENDATIONS MADE BY THE CONSULTANT AND ANY TEST RESULTS WILL BE SENT TO YOUR REFERRING PHYSICIAN.  Iron infusion today Follow up as scheduled Please call the clinic if you have any questions or concerns  Thank you for choosing [facility] at [facility] to provide your oncology and hematology care.  To afford each patient quality time with our provider, please arrive at least 15 minutes before your scheduled appointment time.    You need to re-schedule your appointment should you arrive 10 or more minutes late.  We strive to give you quality time with our providers, and arriving late affects you and other patients whose appointments are after yours.  Also, if you no show three or more times for appointments you may be dismissed from the clinic at the providers discretion.     Again, thank you for choosing [facility].  Our hope is that these requests will decrease the amount of time that you wait before being seen by our physicians.       _____________________________________________________________  Should you have questions after your visit to [facility], please contact our office at [phone] between the hours of 8:30 a.m. and 4:30 p.m.  Voicemails left after 4:30 p.m. will not be returned until the following business day.  For prescription refill requests, have your pharmacy contact our office.    

## 2014-06-18 NOTE — Progress Notes (Signed)
April [name] Tolerated iron infusion well today Discharged ambulatory

## 2014-08-27 ENCOUNTER — Encounter (HOSPITAL_COMMUNITY): Payer: Self-pay

## 2014-08-27 ENCOUNTER — Ambulatory Visit (HOSPITAL_COMMUNITY): Payer: Self-pay | Admitting: Hematology & Oncology

## 2014-08-27 ENCOUNTER — Encounter (HOSPITAL_COMMUNITY): Payer: BLUE CROSS/BLUE SHIELD | Attending: Hematology & Oncology

## 2014-08-27 ENCOUNTER — Other Ambulatory Visit (HOSPITAL_COMMUNITY): Payer: Self-pay

## 2014-08-27 ENCOUNTER — Encounter (HOSPITAL_BASED_OUTPATIENT_CLINIC_OR_DEPARTMENT_OTHER): Payer: BLUE CROSS/BLUE SHIELD

## 2014-08-27 VITALS — BP 112/74 | HR 65 | Temp 98.0°F | Resp 18 | Wt 223.4 lb

## 2014-08-27 DIAGNOSIS — Z9884 Bariatric surgery status: Secondary | ICD-10-CM

## 2014-08-27 DIAGNOSIS — D509 Iron deficiency anemia, unspecified: Secondary | ICD-10-CM | POA: Insufficient documentation

## 2014-08-27 DIAGNOSIS — K909 Intestinal malabsorption, unspecified: Secondary | ICD-10-CM | POA: Diagnosis not present

## 2014-08-27 DIAGNOSIS — E538 Deficiency of other specified B group vitamins: Secondary | ICD-10-CM | POA: Diagnosis not present

## 2014-08-27 LAB — IRON AND TIBC
Iron: 97 ug/dL (ref 28–170)
SATURATION RATIOS: 35 % — AB (ref 10.4–31.8)
TIBC: 274 ug/dL (ref 250–450)
UIBC: 177 ug/dL

## 2014-08-27 LAB — CBC WITH DIFFERENTIAL/PLATELET
BASOS ABS: 0 10*3/uL (ref 0.0–0.1)
Basophils Relative: 0 % (ref 0–1)
EOS ABS: 0.1 10*3/uL (ref 0.0–0.7)
EOS PCT: 2 % (ref 0–5)
HCT: 36.8 % (ref 36.0–46.0)
Hemoglobin: 12.4 g/dL (ref 12.0–15.0)
LYMPHS PCT: 27 % (ref 12–46)
Lymphs Abs: 1.3 10*3/uL (ref 0.7–4.0)
MCH: 33.2 pg (ref 26.0–34.0)
MCHC: 33.7 g/dL (ref 30.0–36.0)
MCV: 98.4 fL (ref 78.0–100.0)
MONO ABS: 0.4 10*3/uL (ref 0.1–1.0)
Monocytes Relative: 8 % (ref 3–12)
Neutro Abs: 3.1 10*3/uL (ref 1.7–7.7)
Neutrophils Relative %: 63 % (ref 43–77)
PLATELETS: 302 10*3/uL (ref 150–400)
RBC: 3.74 MIL/uL — ABNORMAL LOW (ref 3.87–5.11)
RDW: 12.6 % (ref 11.5–15.5)
WBC: 5 10*3/uL (ref 4.0–10.5)

## 2014-08-27 LAB — COMPREHENSIVE METABOLIC PANEL
ALT: 22 U/L (ref 14–54)
AST: 23 U/L (ref 15–41)
Albumin: 3.6 g/dL (ref 3.5–5.0)
Alkaline Phosphatase: 89 U/L (ref 38–126)
Anion gap: 4 — ABNORMAL LOW (ref 5–15)
BUN: 12 mg/dL (ref 6–20)
CHLORIDE: 108 mmol/L (ref 101–111)
CO2: 27 mmol/L (ref 22–32)
Calcium: 8.5 mg/dL — ABNORMAL LOW (ref 8.9–10.3)
Creatinine, Ser: 0.73 mg/dL (ref 0.44–1.00)
GFR calc non Af Amer: >60 mL/min (ref >60)
Glucose, Bld: 85 mg/dL (ref 65–99)
POTASSIUM: 4 mmol/L (ref 3.5–5.1)
SODIUM: 139 mmol/L (ref 135–145)
Total Bilirubin: 0.7 mg/dL (ref 0.3–1.2)
Total Protein: 6.4 g/dL — ABNORMAL LOW (ref 6.5–8.1)

## 2014-08-27 LAB — FERRITIN: FERRITIN: 83 ng/mL (ref 11–307)

## 2014-08-27 LAB — VITAMIN B12: Vitamin B-12: 239 pg/mL (ref 180–914)

## 2014-08-27 NOTE — Progress Notes (Signed)
Labs drawn

## 2014-08-27 NOTE — Patient Instructions (Signed)
..  [facility] at [facility] Discharge Instructions  RECOMMENDATIONS MADE BY THE CONSULTANT AND ANY TEST RESULTS WILL BE SENT TO YOUR REFERRING PHYSICIAN.  Exam per Dr. Candise Che  We will call you with lab results- adding b12 to labs from today Return in 6 months to see Dr. Galen Manila and for labs    Thank you for choosing [facility] at [facility] to provide your oncology and hematology care.  To afford each patient quality time with our provider, please arrive at least 15 minutes before your scheduled appointment time.    You need to re-schedule your appointment should you arrive 10 or more minutes late.  We strive to give you quality time with our providers, and arriving late affects you and other patients whose appointments are after yours.  Also, if you no show three or more times for appointments you may be dismissed from the clinic at the providers discretion.     Again, thank you for choosing [facility].  Our hope is that these requests will decrease the amount of time that you wait before being seen by our physicians.       _____________________________________________________________  Should you have questions after your visit to [facility], please contact our office at [phone] between the hours of 8:30 a.m. and 4:30 p.m.  Voicemails left after 4:30 p.m. will not be returned until the following business day.  For prescription refill requests, have your pharmacy contact our office.

## 2014-08-29 NOTE — Progress Notes (Signed)
[facility] ONCOLOGY PROGRESS NOTE  Date of service: 08/27/2014  Patient Care Team: [name], DO as PCP - General (Geriatric Medicine)  DIAGNOSIS: Iron deficiency anemia secondary to malabsorption History of gastric bypass B12 deficiency, on SL B12 Colonoscopy on 08/07/2013 with a large redundant colon and internal/external hemorrhoids EGD on 08/07/2013 with a Schatzki ring at the GE junction and smallclean-based ulcer at anastomosis  CURRENT THERAPY: IV iron as needed  INTERVAL HISTORY:  April [name] is here for follow-up of her iron deficiency anemia which is thought to be due to poor absorption related to gastric bypass surgery. She notes that she is feeling well and has no acute new concerns. No issues with overt GI leading where the blood loss. Received her last IV Feraheme in June 2016. Has been taking her sublingual vitamin B12 though does report some missed doses. No symptoms of pica. Denies excessive hair loss or nail changes. Had labs this morning.   REVIEW OF SYSTEMS:   Constitutional: Denies fevers, chills or abnormal weight loss Eyes: Denies blurriness of vision Ears, nose, mouth, throat, and face: Denies mucositis or sore throat Respiratory: Denies cough, dyspnea or wheezes Cardiovascular: Denies palpitation, chest discomfort or lower extremity swelling Gastrointestinal:  Denies nausea, heartburn or change in bowel habits Skin: Denies abnormal skin rashes Lymphatics: Denies new lymphadenopathy or easy bruising Neurological:Denies numbness, tingling or new weaknesses Behavioral/Psych: Mood is stable, no new changes  All other systems were reviewed with the patient and are negative.  I have reviewed the past medical history, past surgical history, social history and family history with the patient and they are unchanged from previous note.  ALLERGIES:  has No  Known Allergies.  MEDICATIONS:  Current Outpatient Prescriptions  Medication Sig Dispense Refill  . acetaminophen (TYLENOL) 500 MG tablet Take 500 mg by mouth every 6 (six) hours as needed for mild pain or headache.    . cholecalciferol (VITAMIN D) 1000 UNITS tablet Take 1,000 Units by mouth daily.    Marland Kitchen escitalopram (LEXAPRO) 10 MG tablet Take 10 mg by mouth daily.    Marland Kitchen levothyroxine (SYNTHROID, LEVOTHROID) 50 MCG tablet Take 50 mcg by mouth daily before breakfast.    . Multiple Vitamin (MULTIVITAMIN WITH MINERALS) TABS tablet Take 1 tablet by mouth daily.    . vitamin B-12 (CYANOCOBALAMIN) 100 MCG tablet Take 100 mcg by mouth daily. Uses sublinquil    . ibuprofen (ADVIL,MOTRIN) 200 MG tablet Take 200 mg by mouth every 6 (six) hours as needed for headache or moderate pain.     No current facility-administered medications for this visit.    PHYSICAL EXAMINATION: ECOG PERFORMANCE STATUS: 0 - Asymptomatic  Filed Vitals:   08/27/14 0840  BP: 112/74  Pulse: 65  Temp: 98 F (36.7 C)  Resp: 18   Filed Weights   08/27/14 0840  Weight: 223 lb 6.4 oz (101.334 kg)   GENERAL:alert, in no acute distress and comfortable SKIN: skin color, texture, turgor are normal, no rashes or significant lesions EYES: normal, conjunctiva are pink and non-injected, sclera clear OROPHARYNX:no exudate, no erythema and lips, buccal mucosa, and tongue normal  NECK: supple, no JVD, thyroid normal size, non-tender, without nodularity LYMPH:  no palpable lymphadenopathy in the cervical, axillary or inguinal LUNGS: clear to auscultation with normal respiratory effort HEART: regular rate & rhythm,  no murmurs and no lower extremity edema ABDOMEN: abdomen soft, non-tender, normoactive bowel sounds  Musculoskeletal: no cyanosis of digits and no clubbing  PSYCH: alert & oriented x 3  with fluent speech NEURO: no focal motor/sensory deficits  LABORATORY DATA:   . CBC Latest Ref Rng 08/27/2014 05/28/2014 02/26/2014   WBC 4.0 - 10.5 K/uL 5.0 3.8(L) 5.2  Hemoglobin 12.0 - 15.0 g/dL 16.1 11.6(L) 12.2  Hematocrit 36.0 - 46.0 % 36.8 35.4(L) 36.9  Platelets 150 - 400 K/uL 302 311 322    . CBC    Component Value Date/Time   WBC 5.0 08/27/2014 0857   RBC 3.74* 08/27/2014 0857   HGB 12.4 08/27/2014 0857   HCT 36.8 08/27/2014 0857   PLT 302 08/27/2014 0857   MCV 98.4 08/27/2014 0857   MCH 33.2 08/27/2014 0857   MCHC 33.7 08/27/2014 0857   RDW 12.6 08/27/2014 0857   LYMPHSABS 1.3 08/27/2014 0857   MONOABS 0.4 08/27/2014 0857   EOSABS 0.1 08/27/2014 0857   BASOSABS 0.0 08/27/2014 0857   . CMP Latest Ref Rng 08/27/2014 05/28/2014 09/10/2013  Glucose 65 - 99 mg/dL 85 85 94  BUN 6 - 20 mg/dL Creatinine 0.44 - 1.00 mg/dL 0.96 0.45 4.09  Sodium 135 - 145 mmol/L 139 141 144  Potassium 3.5 - 5.1 mmol/L 4.0 3.8 3.2(L)  Chloride 101 - 111 mmol/L 108 106 105  CO2 22 - 32 mmol/L Calcium 8.9 - 10.3 mg/dL 8.1(X) 8.3(L) 9.0  Total Protein 6.5 - 8.1 g/dL 6.4(L) 6.3(L) 7.0  Total Bilirubin 0.3 - 1.2 mg/dL 0.7 0.5 0.3  Alkaline Phos 38 - 126 U/L 89 95 103  AST 15 - 41 U/L ALT 14 - 54 U/L . Lab Results  Component Value Date   IRON 97 08/27/2014   TIBC 274 08/27/2014   IRONPCTSAT 35* 08/27/2014   (Iron and TIBC)  Lab Results  Component Value Date   FERRITIN 83 08/27/2014   B12 levels: 239  RADIOGRAPHIC STUDIES: I have personally reviewed the radiological images as listed and agreed with the findings in the report. No results found.   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 Patient has no significant anemia at this time. Ferritin levels and iron saturation are currently reasonable at 83 and 35% respectively. 2) low normal B12 levels at 239. Patient has been running higher MCV value is between 98 and 100 suggesting possibly B12 deficient erythropoiesis. Plan -I will let Dr.  Galen [name] decide if she will may want to replace B12 more aggressively either subcutaneously 1000 micrograms monthly or  -switching from 100 g to 1000 micrograms sublingually daily to target B12 levels 500 to 1000. -No indication of IV iron at this time  Return to care with April [name] in 6 months with CBC, CMP, ferritin, iron profile and B12 level. Continue follow-up with primary care physician to monitor and replace other bariatric surgery related nutritional deficiencies including vitamin D.   All questions were answered. The patient knows to call the clinic with any problems, questions or concerns. No barriers to learning was detected.   April Lora MD MS Hematology/Oncology Physician [facility]  (Office):       [phone] (Work cell):  [phone] (Fax):           [phone]

## 2014-09-27 ENCOUNTER — Other Ambulatory Visit (HOSPITAL_COMMUNITY): Payer: Self-pay | Admitting: *Deleted

## 2014-09-27 DIAGNOSIS — K909 Intestinal malabsorption, unspecified: Secondary | ICD-10-CM

## 2014-09-27 DIAGNOSIS — Z9884 Bariatric surgery status: Secondary | ICD-10-CM

## 2014-09-27 DIAGNOSIS — K912 Postsurgical malabsorption, not elsewhere classified: Secondary | ICD-10-CM

## 2014-09-30 ENCOUNTER — Encounter (HOSPITAL_COMMUNITY): Payer: BLUE CROSS/BLUE SHIELD

## 2014-09-30 ENCOUNTER — Encounter (HOSPITAL_COMMUNITY): Payer: BLUE CROSS/BLUE SHIELD | Attending: Hematology & Oncology

## 2014-09-30 ENCOUNTER — Encounter (HOSPITAL_COMMUNITY): Payer: Self-pay

## 2014-09-30 VITALS — BP 113/50 | HR 57 | Temp 98.0°F | Resp 18

## 2014-09-30 DIAGNOSIS — D509 Iron deficiency anemia, unspecified: Secondary | ICD-10-CM

## 2014-09-30 DIAGNOSIS — K912 Postsurgical malabsorption, not elsewhere classified: Secondary | ICD-10-CM | POA: Diagnosis not present

## 2014-09-30 DIAGNOSIS — K909 Intestinal malabsorption, unspecified: Secondary | ICD-10-CM | POA: Insufficient documentation

## 2014-09-30 DIAGNOSIS — Z9884 Bariatric surgery status: Secondary | ICD-10-CM

## 2014-09-30 DIAGNOSIS — E538 Deficiency of other specified B group vitamins: Secondary | ICD-10-CM | POA: Diagnosis not present

## 2014-09-30 MED ORDER — SODIUM CHLORIDE 0.9 % IV SOLN
INTRAVENOUS | Status: DC
  Administered 2014-09-30: 14:00:00 via INTRAVENOUS

## 2014-09-30 MED ORDER — CYANOCOBALAMIN 1000 MCG/ML IJ SOLN
1000.0000 ug | Freq: Once | INTRAMUSCULAR | Status: AC
  Administered 2014-09-30: 1000 ug via INTRAMUSCULAR
  Filled 2014-09-30: qty 1

## 2014-09-30 MED ORDER — SODIUM CHLORIDE 0.9 % IV SOLN
510.0000 mg | Freq: Once | INTRAVENOUS | Status: AC
  Administered 2014-09-30: 510 mg via INTRAVENOUS
  Filled 2014-09-30: qty 17

## 2014-09-30 NOTE — Progress Notes (Signed)
April [name] Tolerated iron infusion well discharged ambulatory

## 2014-09-30 NOTE — Progress Notes (Signed)
..  April [name] presents today for injection per the provider's orders.  Vit b12 administration without incident; see MAR for injection details.  Patient tolerated procedure well and without incident.  No questions or complaints noted at this time.

## 2014-09-30 NOTE — Patient Instructions (Signed)
feraheme 1 dose today  B12 injection today and we will give you one injection weekly x 3 more weeks then monthly   The plan is to keep you on the b12 injections for 6 months as you become more consistent with the sublingual B12  Follow up as planned

## 2014-09-30 NOTE — Progress Notes (Signed)
Tolerated iron well 

## 2014-10-01 ENCOUNTER — Ambulatory Visit (HOSPITAL_COMMUNITY): Payer: BLUE CROSS/BLUE SHIELD

## 2014-10-05 ENCOUNTER — Other Ambulatory Visit: Payer: Self-pay | Admitting: Internal Medicine

## 2014-10-05 DIAGNOSIS — Z1231 Encounter for screening mammogram for malignant neoplasm of breast: Secondary | ICD-10-CM

## 2014-10-08 ENCOUNTER — Encounter (HOSPITAL_COMMUNITY): Payer: Self-pay

## 2014-10-08 ENCOUNTER — Encounter (HOSPITAL_BASED_OUTPATIENT_CLINIC_OR_DEPARTMENT_OTHER): Payer: BLUE CROSS/BLUE SHIELD

## 2014-10-08 VITALS — BP 151/80 | HR 66 | Temp 98.0°F | Resp 18

## 2014-10-08 DIAGNOSIS — Z9884 Bariatric surgery status: Secondary | ICD-10-CM

## 2014-10-08 DIAGNOSIS — K912 Postsurgical malabsorption, not elsewhere classified: Secondary | ICD-10-CM

## 2014-10-08 DIAGNOSIS — E538 Deficiency of other specified B group vitamins: Secondary | ICD-10-CM

## 2014-10-08 MED ORDER — CYANOCOBALAMIN 1000 MCG/ML IJ SOLN
1000.0000 ug | Freq: Once | INTRAMUSCULAR | Status: AC
  Administered 2014-10-08: 1000 ug via INTRAMUSCULAR

## 2014-10-08 NOTE — Progress Notes (Signed)
April [name] presents today for injection per the provider's orders.  B12 administration without incident; see MAR for injection details.  Patient tolerated procedure well and without incident.  No questions or complaints noted at this time.

## 2014-10-08 NOTE — Patient Instructions (Signed)
[facility] at [facility] Discharge Instructions  RECOMMENDATIONS MADE BY THE CONSULTANT AND ANY TEST RESULTS WILL BE SENT TO YOUR REFERRING PHYSICIAN.  B12 injection today. Return as scheduled for injections. Return as scheduled for lab work and office visit.  Thank you for choosing [facility] at [facility] to provide your oncology and hematology care.  To afford each patient quality time with our provider, please arrive at least 15 minutes before your scheduled appointment time.    You need to re-schedule your appointment should you arrive 10 or more minutes late.  We strive to give you quality time with our providers, and arriving late affects you and other patients whose appointments are after yours.  Also, if you no show three or more times for appointments you may be dismissed from the clinic at the providers discretion.     Again, thank you for choosing [facility].  Our hope is that these requests will decrease the amount of time that you wait before being seen by our physicians.       _____________________________________________________________  Should you have questions after your visit to [facility], please contact our office at [phone] between the hours of 8:30 a.m. and 4:30 p.m.  Voicemails left after 4:30 p.m. will not be returned until the following business day.  For prescription refill requests, have your pharmacy contact our office.    

## 2014-10-15 ENCOUNTER — Encounter (HOSPITAL_COMMUNITY): Payer: BLUE CROSS/BLUE SHIELD | Attending: Hematology & Oncology

## 2014-10-15 ENCOUNTER — Encounter (HOSPITAL_COMMUNITY): Payer: Self-pay

## 2014-10-15 VITALS — BP 130/61 | HR 66 | Temp 97.8°F | Resp 18

## 2014-10-15 DIAGNOSIS — D509 Iron deficiency anemia, unspecified: Secondary | ICD-10-CM | POA: Insufficient documentation

## 2014-10-15 DIAGNOSIS — E538 Deficiency of other specified B group vitamins: Secondary | ICD-10-CM

## 2014-10-15 DIAGNOSIS — K912 Postsurgical malabsorption, not elsewhere classified: Secondary | ICD-10-CM

## 2014-10-15 DIAGNOSIS — Z9884 Bariatric surgery status: Secondary | ICD-10-CM

## 2014-10-15 DIAGNOSIS — K909 Intestinal malabsorption, unspecified: Secondary | ICD-10-CM | POA: Insufficient documentation

## 2014-10-15 MED ORDER — CYANOCOBALAMIN 1000 MCG/ML IJ SOLN
INTRAMUSCULAR | Status: AC
  Filled 2014-10-15: qty 1

## 2014-10-15 MED ORDER — CYANOCOBALAMIN 1000 MCG/ML IJ SOLN
1000.0000 ug | Freq: Once | INTRAMUSCULAR | Status: AC
  Administered 2014-10-15: 1000 ug via INTRAMUSCULAR

## 2014-10-15 NOTE — Progress Notes (Signed)
Wilma A Twilley's reason for visit today is for an injection as scheduled per MD orders.    Bayli A Begley also received vitamin b12 injection per MD orders; see MAR for administration details.  Margrete A Buttermore tolerated all procedures well and without incident; questions were answered and patient was discharged.  

## 2014-10-15 NOTE — Patient Instructions (Signed)
Vitamin b12 injection today Return as scheduled Please call the clinic if you have any questions or concerns

## 2014-10-21 ENCOUNTER — Encounter (HOSPITAL_BASED_OUTPATIENT_CLINIC_OR_DEPARTMENT_OTHER): Payer: BLUE CROSS/BLUE SHIELD

## 2014-10-21 ENCOUNTER — Encounter (HOSPITAL_COMMUNITY): Payer: Self-pay

## 2014-10-21 VITALS — BP 135/67 | HR 90 | Temp 97.7°F | Resp 18

## 2014-10-21 DIAGNOSIS — Z9884 Bariatric surgery status: Secondary | ICD-10-CM

## 2014-10-21 DIAGNOSIS — E538 Deficiency of other specified B group vitamins: Secondary | ICD-10-CM | POA: Diagnosis not present

## 2014-10-21 DIAGNOSIS — K912 Postsurgical malabsorption, not elsewhere classified: Secondary | ICD-10-CM

## 2014-10-21 MED ORDER — CYANOCOBALAMIN 1000 MCG/ML IJ SOLN
1000.0000 ug | Freq: Once | INTRAMUSCULAR | Status: AC
  Administered 2014-10-21: 1000 ug via INTRAMUSCULAR

## 2014-10-21 MED ORDER — CYANOCOBALAMIN 1000 MCG/ML IJ SOLN
INTRAMUSCULAR | Status: AC
  Filled 2014-10-21: qty 1

## 2014-10-21 NOTE — Progress Notes (Signed)
Aram BeechamCynthia A Brallier's reason for visit today is for an injection as scheduled per MD orders.    Nestor Lewandowskyynthia A Leonor also received vitamin b12 injection per MD orders; see [facility] for administration details.  Aram BeechamCynthia A Deskins tolerated all procedures well and without incident; questions were answered and patient was discharged.

## 2014-10-21 NOTE — Patient Instructions (Signed)
b12 injection Follow up as scheduled Please call the clinic if you have any questions or concerns 

## 2014-10-22 ENCOUNTER — Ambulatory Visit (HOSPITAL_COMMUNITY): Payer: BLUE CROSS/BLUE SHIELD

## 2014-11-05 ENCOUNTER — Ambulatory Visit (HOSPITAL_COMMUNITY)
Admission: RE | Admit: 2014-11-05 | Discharge: 2014-11-05 | Disposition: A | Discharge status: Home / Self Care | Payer: BLUE CROSS/BLUE SHIELD | Source: Ambulatory Visit | Attending: Internal Medicine | Admitting: Internal Medicine

## 2014-11-05 DIAGNOSIS — Z1231 Encounter for screening mammogram for malignant neoplasm of breast: Secondary | ICD-10-CM | POA: Diagnosis not present

## 2014-11-17 ENCOUNTER — Telehealth: Payer: Self-pay

## 2014-11-17 NOTE — Telephone Encounter (Signed)
Called patient x 2, line busy. I will try to reach patient again later

## 2014-11-17 NOTE — Telephone Encounter (Signed)
Patient left message on triage voicemail requesting refills late yesterday. I called patient this morning to inquire about who was filling medications in the past. Patient not seen here in over 2 years.   Left message for patient to return call to discuss her request.  Patient called back stating she was seeing an [facility] Dr.Davis prescribed medications in the past. Patient is no longer seeing this doctor and needs refill on Levoxyl 50 mg 1 by mouth daily and Lexapro 10 mg 1 by mouth daily. Patient with pending appointment with Abbey ChattersJessica Eubanks to get re-established on 11/23/14. [facility] [place]   Please advise

## 2014-11-17 NOTE — Telephone Encounter (Signed)
No refill until pt is seen

## 2014-11-18 NOTE — Telephone Encounter (Signed)
I tried to call patient again, line still busy. We will try again later

## 2014-11-19 ENCOUNTER — Encounter (HOSPITAL_COMMUNITY): Payer: BLUE CROSS/BLUE SHIELD | Attending: Hematology & Oncology

## 2014-11-19 VITALS — BP 128/76 | HR 53 | Temp 98.2°F | Resp 12

## 2014-11-19 DIAGNOSIS — K909 Intestinal malabsorption, unspecified: Secondary | ICD-10-CM | POA: Insufficient documentation

## 2014-11-19 DIAGNOSIS — K912 Postsurgical malabsorption, not elsewhere classified: Secondary | ICD-10-CM

## 2014-11-19 DIAGNOSIS — Z9884 Bariatric surgery status: Secondary | ICD-10-CM

## 2014-11-19 DIAGNOSIS — E538 Deficiency of other specified B group vitamins: Secondary | ICD-10-CM | POA: Diagnosis not present

## 2014-11-19 DIAGNOSIS — D509 Iron deficiency anemia, unspecified: Secondary | ICD-10-CM | POA: Insufficient documentation

## 2014-11-19 MED ORDER — CYANOCOBALAMIN 1000 MCG/ML IJ SOLN
1000.0000 ug | Freq: Once | INTRAMUSCULAR | Status: AC
  Administered 2014-11-19: 1000 ug via INTRAMUSCULAR

## 2014-11-19 MED ORDER — CYANOCOBALAMIN 1000 MCG/ML IJ SOLN
INTRAMUSCULAR | Status: AC
  Filled 2014-11-19: qty 1

## 2014-11-19 NOTE — Progress Notes (Signed)
Nestor Lewandowskyynthia A Naab presents today for injection per MD orders. B12 1000mcg administered IM in left Upper Arm. Administration without incident. Patient tolerated well.

## 2014-11-19 NOTE — Patient Instructions (Signed)
[facility] at [facility] Discharge Instructions  RECOMMENDATIONS MADE BY THE CONSULTANT AND ANY TEST RESULTS WILL BE SENT TO YOUR REFERRING PHYSICIAN.  B12 today. Please return as scheduled.  See appointment list for date and time.    Thank you for choosing [facility] at [facility] to provide your oncology and hematology care.  To afford each patient quality time with our provider, please arrive at least 15 minutes before your scheduled appointment time.    You need to re-schedule your appointment should you arrive 10 or more minutes late.  We strive to give you quality time with our providers, and arriving late affects you and other patients whose appointments are after yours.  Also, if you no show three or more times for appointments you may be dismissed from the clinic at the providers discretion.     Again, thank you for choosing [facility].  Our hope is that these requests will decrease the amount of time that you wait before being seen by our physicians.       _____________________________________________________________  Should you have questions after your visit to [facility], please contact our office at [phone] between the hours of 8:30 a.m. and 4:30 p.m.  Voicemails left after 4:30 p.m. will not be returned until the following business day.  For prescription refill requests, have your pharmacy contact our office.

## 2014-11-23 ENCOUNTER — Encounter: Payer: Self-pay | Admitting: Nurse Practitioner

## 2014-11-23 ENCOUNTER — Ambulatory Visit (INDEPENDENT_AMBULATORY_CARE_PROVIDER_SITE_OTHER): Payer: BLUE CROSS/BLUE SHIELD | Admitting: Nurse Practitioner

## 2014-11-23 VITALS — BP 108/70 | HR 67 | Temp 97.8°F | Resp 14 | Ht 71.0 in | Wt 227.0 lb

## 2014-11-23 DIAGNOSIS — F419 Anxiety disorder, unspecified: Secondary | ICD-10-CM

## 2014-11-23 DIAGNOSIS — Z23 Encounter for immunization: Secondary | ICD-10-CM | POA: Diagnosis not present

## 2014-11-23 DIAGNOSIS — Z0000 Encounter for general adult medical examination without abnormal findings: Secondary | ICD-10-CM

## 2014-11-23 DIAGNOSIS — E039 Hypothyroidism, unspecified: Secondary | ICD-10-CM

## 2014-11-23 DIAGNOSIS — Z124 Encounter for screening for malignant neoplasm of cervix: Secondary | ICD-10-CM | POA: Diagnosis not present

## 2014-11-23 DIAGNOSIS — E119 Type 2 diabetes mellitus without complications: Secondary | ICD-10-CM

## 2014-11-23 DIAGNOSIS — F418 Other specified anxiety disorders: Secondary | ICD-10-CM

## 2014-11-23 DIAGNOSIS — F32A Depression, unspecified: Secondary | ICD-10-CM

## 2014-11-23 DIAGNOSIS — F329 Major depressive disorder, single episode, unspecified: Secondary | ICD-10-CM

## 2014-11-23 MED ORDER — LEVOTHYROXINE SODIUM 50 MCG PO TABS: 50.0000 ug | ORAL_TABLET | Freq: Every day | ORAL | Status: DC

## 2014-11-23 MED ORDER — ESCITALOPRAM OXALATE 10 MG PO TABS: 10.0000 mg | ORAL_TABLET | Freq: Every day | ORAL | Status: DC

## 2014-11-23 NOTE — Patient Instructions (Signed)
Lab work on Friday Follow up yearly or as needed  Health Maintenance, Female Adopting a healthy lifestyle and getting preventive care can go a long way to promote health and wellness. Talk with your health care provider about what schedule of regular examinations is right for you. This is a good chance for you to check in with your provider about disease prevention and staying healthy. In between checkups, there are plenty of things you can do on your own. Experts have done a lot of research about which lifestyle changes and preventive measures are most likely to keep you healthy. Ask your health care provider for more information. WEIGHT AND DIET  Eat a healthy diet  Be sure to include plenty of vegetables, fruits, low-fat dairy products, and lean protein.  Do not eat a lot of foods high in solid fats, added sugars, or salt.  Get regular exercise. This is one of the most important things you can do for your health.  Most adults should exercise for at least 150 minutes each week. The exercise should increase your heart rate and make you sweat (moderate-intensity exercise).  Most adults should also do strengthening exercises at least twice a week. This is in addition to the moderate-intensity exercise.  Maintain a healthy weight  Body mass index (BMI) is a measurement that can be used to identify possible weight problems. It estimates body fat based on height and weight. Your health care provider can help determine your BMI and help you achieve or maintain a healthy weight.  For females 17 years of age and older:   A BMI below 18.5 is considered underweight.  A BMI of 18.5 to 24.9 is normal.  A BMI of 25 to 29.9 is considered overweight.  A BMI of 30 and above is considered obese.  Watch levels of cholesterol and blood lipids  You should start having your blood tested for lipids and cholesterol at 59 years of age, then have this test every 5 years.  You may need to have your  cholesterol levels checked more often if:  Your lipid or cholesterol levels are high.  You are older than 59 years of age.  You are at high risk for heart disease.  CANCER SCREENING   Lung Cancer  Lung cancer screening is recommended for adults 23-88 years old who are at high risk for lung cancer because of a history of smoking.  A yearly low-dose CT scan of the lungs is recommended for people who:  Currently smoke.  Have quit within the past 15 years.  Have at least a 30-pack-year history of smoking. A pack year is smoking an average of one pack of cigarettes a day for 1 year.  Yearly screening should continue until it has been 15 years since you quit.  Yearly screening should stop if you develop a health problem that would prevent you from having lung cancer treatment.  Breast Cancer  Practice breast self-awareness. This means understanding how your breasts normally appear and feel.  It also means doing regular breast self-exams. Let your health care provider know about any changes, no matter how small.  If you are in your 20s or 30s, you should have a clinical breast exam (CBE) by a health care provider every 1-3 years as part of a regular health exam.  If you are 44 or older, have a CBE every year. Also consider having a breast X-ray (mammogram) every year.  If you have a family history of breast cancer, talk  to your health care provider about genetic screening.  If you are at high risk for breast cancer, talk to your health care provider about having an MRI and a mammogram every year.  Breast cancer gene (BRCA) assessment is recommended for women who have family members with BRCA-related cancers. BRCA-related cancers include:  Breast.  Ovarian.  Tubal.  Peritoneal cancers.  Results of the assessment will determine the need for genetic counseling and BRCA1 and BRCA2 testing. Cervical Cancer Your health care provider may recommend that you be screened regularly  for cancer of the pelvic organs (ovaries, uterus, and vagina). This screening involves a pelvic examination, including checking for microscopic changes to the surface of your cervix (Pap test). You may be encouraged to have this screening done every 3 years, beginning at age 61.  For women ages 66-65, health care providers may recommend pelvic exams and Pap testing every 3 years, or they may recommend the Pap and pelvic exam, combined with testing for human papilloma virus (HPV), every 5 years. Some types of HPV increase your risk of cervical cancer. Testing for HPV may also be done on women of any age with unclear Pap test results.  Other health care providers may not recommend any screening for nonpregnant women who are considered low risk for pelvic cancer and who do not have symptoms. Ask your health care provider if a screening pelvic exam is right for you.  If you have had past treatment for cervical cancer or a condition that could lead to cancer, you need Pap tests and screening for cancer for at least 20 years after your treatment. If Pap tests have been discontinued, your risk factors (such as having a new sexual partner) need to be reassessed to determine if screening should resume. Some women have medical problems that increase the chance of getting cervical cancer. In these cases, your health care provider may recommend more frequent screening and Pap tests. Colorectal Cancer  This type of cancer can be detected and often prevented.  Routine colorectal cancer screening usually begins at 59 years of age and continues through 59 years of age.  Your health care provider may recommend screening at an earlier age if you have risk factors for colon cancer.  Your health care provider may also recommend using home test kits to check for hidden blood in the stool.  A small camera at the end of a tube can be used to examine your colon directly (sigmoidoscopy or colonoscopy). This is done to check  for the earliest forms of colorectal cancer.  Routine screening usually begins at age 109.  Direct examination of the colon should be repeated every 5-10 years through 59 years of age. However, you may need to be screened more often if early forms of precancerous polyps or small growths are found. Skin Cancer  Check your skin from head to toe regularly.  Tell your health care provider about any new moles or changes in moles, especially if there is a change in a mole's shape or color.  Also tell your health care provider if you have a mole that is larger than the size of a pencil eraser.  Always use sunscreen. Apply sunscreen liberally and repeatedly throughout the day.  Protect yourself by wearing long sleeves, pants, a wide-brimmed hat, and sunglasses whenever you are outside. HEART DISEASE, DIABETES, AND HIGH BLOOD PRESSURE   High blood pressure causes heart disease and increases the risk of stroke. High blood pressure is more likely to develop  in:  People who have blood pressure in the high end of the normal range (130-139/85-89 mm Hg).  People who are overweight or obese.  People who are African American.  If you are 80-31 years of age, have your blood pressure checked every 3-5 years. If you are 29 years of age or older, have your blood pressure checked every year. You should have your blood pressure measured twice--once when you are at a hospital or clinic, and once when you are not at a hospital or clinic. Record the average of the two measurements. To check your blood pressure when you are not at a hospital or clinic, you can use:  An automated blood pressure machine at a pharmacy.  A home blood pressure monitor.  If you are between 81 years and 53 years old, ask your health care provider if you should take aspirin to prevent strokes.  Have regular diabetes screenings. This involves taking a blood sample to check your fasting blood sugar level.  If you are at a normal  weight and have a low risk for diabetes, have this test once every three years after 59 years of age.  If you are overweight and have a high risk for diabetes, consider being tested at a younger age or more often. PREVENTING INFECTION  Hepatitis B  If you have a higher risk for hepatitis B, you should be screened for this virus. You are considered at high risk for hepatitis B if:  You were born in a country where hepatitis B is common. Ask your health care provider which countries are considered high risk.  Your parents were born in a high-risk country, and you have not been immunized against hepatitis B (hepatitis B vaccine).  You have HIV or AIDS.  You use needles to inject street drugs.  You live with someone who has hepatitis B.  You have had sex with someone who has hepatitis B.  You get hemodialysis treatment.  You take certain medicines for conditions, including cancer, organ transplantation, and autoimmune conditions. Hepatitis C  Blood testing is recommended for:  Everyone born from [birth year] through 1965.  Anyone with known risk factors for hepatitis C. Sexually transmitted infections (STIs)  You should be screened for sexually transmitted infections (STIs) including gonorrhea and chlamydia if:  You are sexually active and are younger than 59 years of age.  You are older than 59 years of age and your health care provider tells you that you are at risk for this type of infection.  Your sexual activity has changed since you were last screened and you are at an increased risk for chlamydia or gonorrhea. Ask your health care provider if you are at risk.  If you do not have HIV, but are at risk, it may be recommended that you take a prescription medicine daily to prevent HIV infection. This is called pre-exposure prophylaxis (PrEP). You are considered at risk if:  You are sexually active and do not regularly use condoms or know the HIV status of your partner(s).  You take  drugs by injection.  You are sexually active with a partner who has HIV. Talk with your health care provider about whether you are at high risk of being infected with HIV. If you choose to begin PrEP, you should first be tested for HIV. You should then be tested every 3 months for as long as you are taking PrEP.  PREGNANCY   If you are premenopausal and you may become pregnant, ask  your health care provider about preconception counseling.  If you may become pregnant, take 400 to 800 micrograms (mcg) of folic acid every day.  If you want to prevent pregnancy, talk to your health care provider about birth control (contraception). OSTEOPOROSIS AND MENOPAUSE   Osteoporosis is a disease in which the bones lose minerals and strength with aging. This can result in serious bone fractures. Your risk for osteoporosis can be identified using a bone density scan.  If you are 35 years of age or older, or if you are at risk for osteoporosis and fractures, ask your health care provider if you should be screened.  Ask your health care provider whether you should take a calcium or vitamin D supplement to lower your risk for osteoporosis.  Menopause may have certain physical symptoms and risks.  Hormone replacement therapy may reduce some of these symptoms and risks. Talk to your health care provider about whether hormone replacement therapy is right for you.  HOME CARE INSTRUCTIONS   Schedule regular health, dental, and eye exams.  Stay current with your immunizations.   Do not use any tobacco products including cigarettes, chewing tobacco, or electronic cigarettes.  If you are pregnant, do not drink alcohol.  If you are breastfeeding, limit how much and how often you drink alcohol.  Limit alcohol intake to no more than 1 drink per day for nonpregnant women. One drink equals 12 ounces of beer, 5 ounces of wine, or 1 ounces of hard liquor.  Do not use street drugs.  Do not share  needles.  Ask your health care provider for help if you need support or information about quitting drugs.  Tell your health care provider if you often feel depressed.  Tell your health care provider if you have ever been abused or do not feel safe at home.   This information is not intended to replace advice given to you by your health care provider. Make sure you discuss any questions you have with your health care provider.   Document Released: 07/10/2010 Document Revised: 01/15/2014 Document Reviewed: 11/26/2012 Elsevier Interactive Patient Education [employer].

## 2014-11-23 NOTE — Progress Notes (Signed)
Patient ID: April [name], female   DOB: 06-25-1955, 59 y.o.   MRN: [id number]    PCP: Hollace Kinnier, DO  Advanced Directive information Does patient have an advance directive?: Yes, Type of Advance Directive: [place];Living will, Does patient want to make changes to advanced directive?: No - Patient declined  No Known Allergies  Chief Complaint  Patient presents with  . Establish Care    New patient establish care: CPX- Pap smear, TD, and Hep C screening   . Medical Management of Chronic Issues     HPI: Patient is a 59 y.o. female seen in the office today to re-establish care, needs complete physical.  Last colonoscopy was 2015, last mammogram 10/24/2014 Was being seen by addiction specialist due to narcotic abuse he was prescribing her medication but doctor lost his licenses and now needs someone to write medication 3 years of recovery in january.  Hx of gastric bypass, following with hematology and getting iron infusion and B12 shot every 3 months.  Has not fasted today.  Taking lexapro for anxiety/depression which has remained stable.  Exercise~ 3 times a week Diet- avoiding sugar and lactose intolerant  Current smoker- but having a hard time quiting due to recovery program  Review of Systems: Review of Systems  Constitutional: Negative for activity change, appetite change, fatigue and unexpected weight change.  HENT: Negative for congestion and hearing loss.   Eyes: Negative.   Respiratory: Negative for cough and shortness of breath.   Cardiovascular: Negative for chest pain, palpitations and leg swelling.  Gastrointestinal: Negative for abdominal pain, diarrhea and constipation.  Genitourinary: Negative for dysuria and difficulty urinating.  Musculoskeletal: Negative for myalgias and arthralgias.  Skin: Negative for color change and wound.  Neurological: Negative for dizziness and weakness.  Psychiatric/Behavioral: Negative for behavioral  problems, confusion and agitation.    Past Medical History  Diagnosis Date  . Anemia   . Hypothyroidism   . Chronic back pain    Past Surgical History  Procedure Laterality Date  . Gastric bypass  [address] [place]  . Cesarean section  [id number]  . Esophagogastroduodenoscopy    . Colonoscopy N/A 08/07/2013    Procedure: COLONOSCOPY;  Surgeon: Danie Binder, MD;  Location: [facility];  Service: Endoscopy;  Laterality: N/A;  10:30-moved to [place] notified pt  . Esophagogastroduodenoscopy N/A 08/07/2013    Procedure: ESOPHAGOGASTRODUODENOSCOPY (EGD);  Surgeon: Danie Binder, MD;  Location: [facility];  Service: Endoscopy;  Laterality: N/A;  . Upper gi endoscopy  2004    Dr.Borhanian   . Tonsillectomy      Childhood   Social History:   reports that she has been smoking Cigarettes.  She has a 5 pack-year smoking history. She has never used smokeless tobacco. She reports that she does not drink alcohol or use illicit drugs.  Family History  Problem Relation Age of Onset  . Heart disease Mother   . Diabetes Mother   . Stroke Mother   . Hypertension Mother   . Diabetes Father   . Heart disease Father   . Hypertension Father   . Colon cancer Neg Hx   . Migraines Daughter     Medications: Patient's Medications  New Prescriptions   No medications on file  Previous Medications   ACETAMINOPHEN (TYLENOL) 500 MG TABLET    Take 500 mg by mouth every 6 (six) hours as needed for mild pain or headache.   CYANOCOBALAMIN (B-12  COMPLIANCE INJECTION) 1000 MCG/ML KIT    Inject as directed every 30 (thirty) days.   ESCITALOPRAM (LEXAPRO) 10 MG TABLET    Take 10 mg by mouth daily.   LEVOTHYROXINE (SYNTHROID, LEVOTHROID) 50 MCG TABLET    Take 50 mcg by mouth daily before breakfast.   VITAMIN B-12 (CYANOCOBALAMIN) 100 MCG TABLET    Take 100 mcg by mouth daily. Uses sublinquil  Modified Medications   No medications on file  Discontinued Medications   CHOLECALCIFEROL (VITAMIN  D) 1000 UNITS TABLET    Take 1,000 Units by mouth daily.   IBUPROFEN (ADVIL,MOTRIN) 200 MG TABLET    Take 200 mg by mouth every 6 (six) hours as needed for headache or moderate pain.   MULTIPLE VITAMIN (MULTIVITAMIN WITH MINERALS) TABS TABLET    Take 1 tablet by mouth daily.     Physical Exam:  Filed Vitals:   11/23/14 1326  BP: 108/70  Pulse: 67  Temp: 97.8 F (36.6 C)  TempSrc: Oral  Resp: 14  Height: $Remove'5\' 11"'pedGpcs$  (1.803 m)  Weight: 227 lb (102.967 kg)  SpO2: 97%   Body mass index is 31.67 kg/(m^2).  Physical Exam  Constitutional: She is oriented to person, place, and time. She appears well-developed and well-nourished. No distress.  HENT:  Head: Normocephalic and atraumatic.  Mouth/Throat: Oropharynx is clear and moist. No oropharyngeal exudate.  Eyes: Conjunctivae are normal. Pupils are equal, round, and reactive to light.  Neck: Normal range of motion. Neck supple.  Cardiovascular: Normal rate, regular rhythm and normal heart sounds.   Pulmonary/Chest: Effort normal and breath sounds normal.  Abdominal: Soft. Bowel sounds are normal.  Genitourinary: Vagina normal and uterus normal.  Musculoskeletal: Normal range of motion. She exhibits no edema or tenderness.  Neurological: She is alert and oriented to person, place, and time.  Skin: Skin is warm and dry. She is not diaphoretic.  Psychiatric: She has a normal mood and affect.    Labs reviewed: Basic Metabolic Panel:  Recent Labs  05/28/14 1104 08/27/14 0857  NA 141 139  K 3.8 4.0  CL 106 108  CO2 28 27  GLUCOSE 85 85  BUN 10 12  CREATININE 0.76 0.73  CALCIUM 8.3* 8.5*   Liver Function Tests:  Recent Labs  05/28/14 1104 08/27/14 0857  AST 21 23  ALT 23 22  ALKPHOS 95 89  BILITOT 0.5 0.7  PROT 6.3* 6.4*  ALBUMIN 3.5 3.6   No results for input(s): LIPASE, AMYLASE in the last 8760 hours. No results for input(s): AMMONIA in the last 8760 hours. CBC:  Recent Labs  02/26/14 1255 05/28/14 1104  08/27/14 0857  WBC 5.2 3.8* 5.0  NEUTROABS 2.8 2.3 3.1  HGB 12.2 11.6* 12.4  HCT 36.9 35.4* 36.8  MCV 100.0 98.3 98.4  PLT 322 311 302   Lipid Panel: No results for input(s): CHOL, HDL, LDLCALC, TRIG, CHOLHDL, LDLDIRECT in the last 8760 hours. TSH: No results for input(s): TSH in the last 8760 hours. A1C: Lab Results  Component Value Date   HGBA1C 5.7* 07/27/2013     Assessment/Plan 1. Need for Tdap vaccination -given today  2. Cervical cancer screening - PAP, Image Guided [[facility], [facility]]  3. Preventative health care The patient is doing well and no distinct problems were identified on exam. PREVENTIVE COUNSELING:  The patient was counseled regarding the appropriate use of alcohol, regular self-examination of the breasts on a monthly basis, prevention of dental and periodontal disease, diet, regular sustained exercise for at least  30 minutes 5 times per week, routine screening interval for mammogram as recommended by the [place] and ACOG, importance of regular PAP smears,  and recommended schedule for GI hemoccult testing, colonoscopy, smoking cessation, cholesterol, thyroid and diabetes screening. - Tdap vaccine greater than or equal to 7yo IM - PAP, Image Guided [[facility], [facility]] - Lipid Panel; Future - Hep C Antibody; Future  4. Hypothyroidism, unspecified hypothyroidism type - levothyroxine (SYNTHROID, LEVOTHROID) 50 MCG tablet; Take 1 tablet (50 mcg total) by mouth daily before breakfast.  Dispense: 90 tablet; Refill: 1 - TSH; Future  5. Anxiety and depression -well controlled on lexapro - escitalopram (LEXAPRO) 10 MG tablet; Take 1 tablet (10 mg total) by mouth daily.  Dispense: 90 tablet; Refill: 1  6. Type 2 diabetes mellitus without complication, without long-term current use of insulin (HCC) -prior to gastric bypass she required medication, now diet controlled - Hemoglobin A1c; Future - Microalbumin, urine    Pranish Akhavan K. Harle Battiest  [facility] [phone] 8 am - 5 pm) [phone] (after hours)

## 2014-11-24 LAB — MICROALBUMIN, URINE: MICROALBUM., U, RANDOM: 9.3 ug/mL

## 2014-11-25 LAB — PAP IG (IMAGE GUIDED): PAP Smear Comment: 0

## 2014-11-26 ENCOUNTER — Other Ambulatory Visit: Payer: BLUE CROSS/BLUE SHIELD

## 2014-11-26 ENCOUNTER — Telehealth: Payer: Self-pay | Admitting: *Deleted

## 2014-11-26 DIAGNOSIS — E039 Hypothyroidism, unspecified: Secondary | ICD-10-CM

## 2014-11-26 DIAGNOSIS — E119 Type 2 diabetes mellitus without complications: Secondary | ICD-10-CM

## 2014-11-26 DIAGNOSIS — Z0000 Encounter for general adult medical examination without abnormal findings: Secondary | ICD-10-CM

## 2014-11-26 NOTE — Telephone Encounter (Signed)
LMOM for patient to return call to the office due to micro albumin was missed this morning during her lab draw. Need to see when she will be available to come back to the office .

## 2014-11-27 LAB — HEMOGLOBIN A1C
ESTIMATED AVERAGE GLUCOSE: 114 mg/dL
Hgb A1c MFr Bld: 5.6 % (ref 4.8–5.6)

## 2014-11-27 LAB — TSH: TSH: 2.37 u[IU]/mL (ref 0.450–4.500)

## 2014-11-27 LAB — LIPID PANEL
Chol/HDL Ratio: 3 ratio units (ref 0.0–4.4)
Cholesterol, Total: 178 mg/dL (ref 100–199)
HDL: 60 mg/dL (ref >39)
LDL Calculated: 98 mg/dL (ref 0–99)
TRIGLYCERIDES: 99 mg/dL (ref 0–149)
VLDL Cholesterol Cal: 20 mg/dL (ref 5–40)

## 2014-11-27 LAB — HEPATITIS C ANTIBODY: Hep C Virus Ab: 0.2 s/co ratio (ref 0.0–0.9)

## 2014-12-08 ENCOUNTER — Encounter: Payer: Self-pay | Admitting: *Deleted

## 2014-12-17 ENCOUNTER — Encounter (HOSPITAL_COMMUNITY): Payer: Self-pay

## 2014-12-17 ENCOUNTER — Encounter (HOSPITAL_COMMUNITY): Payer: BLUE CROSS/BLUE SHIELD | Attending: Hematology & Oncology

## 2014-12-17 VITALS — BP 134/80 | HR 77 | Temp 98.8°F | Resp 16

## 2014-12-17 DIAGNOSIS — D509 Iron deficiency anemia, unspecified: Secondary | ICD-10-CM | POA: Insufficient documentation

## 2014-12-17 DIAGNOSIS — E538 Deficiency of other specified B group vitamins: Secondary | ICD-10-CM | POA: Diagnosis not present

## 2014-12-17 DIAGNOSIS — Z9884 Bariatric surgery status: Secondary | ICD-10-CM

## 2014-12-17 DIAGNOSIS — K912 Postsurgical malabsorption, not elsewhere classified: Secondary | ICD-10-CM

## 2014-12-17 DIAGNOSIS — K909 Intestinal malabsorption, unspecified: Secondary | ICD-10-CM | POA: Insufficient documentation

## 2014-12-17 MED ORDER — CYANOCOBALAMIN 1000 MCG/ML IJ SOLN
INTRAMUSCULAR | Status: AC
  Filled 2014-12-17: qty 1

## 2014-12-17 MED ORDER — CYANOCOBALAMIN 1000 MCG/ML IJ SOLN
1000.0000 ug | Freq: Once | INTRAMUSCULAR | Status: AC
  Administered 2014-12-17: 1000 ug via INTRAMUSCULAR

## 2014-12-17 NOTE — Progress Notes (Signed)
..  Nestor Lewandowskyynthia A Gonzalez presents today for injection per the provider's orders.  Vitamin b12 administration without incident; see MAR for injection details.  Patient tolerated procedure well and without incident.  No questions or complaints noted at this time.

## 2015-01-14 ENCOUNTER — Encounter (HOSPITAL_COMMUNITY): Payer: BLUE CROSS/BLUE SHIELD | Attending: Hematology & Oncology

## 2015-01-14 VITALS — BP 126/64 | HR 72 | Temp 97.9°F | Resp 18

## 2015-01-14 DIAGNOSIS — D509 Iron deficiency anemia, unspecified: Secondary | ICD-10-CM | POA: Insufficient documentation

## 2015-01-14 DIAGNOSIS — K909 Intestinal malabsorption, unspecified: Secondary | ICD-10-CM | POA: Insufficient documentation

## 2015-01-14 DIAGNOSIS — K912 Postsurgical malabsorption, not elsewhere classified: Secondary | ICD-10-CM

## 2015-01-14 DIAGNOSIS — E538 Deficiency of other specified B group vitamins: Secondary | ICD-10-CM | POA: Diagnosis not present

## 2015-01-14 DIAGNOSIS — Z9884 Bariatric surgery status: Secondary | ICD-10-CM

## 2015-01-14 MED ORDER — CYANOCOBALAMIN 1000 MCG/ML IJ SOLN
1000.0000 ug | Freq: Once | INTRAMUSCULAR | Status: AC
  Administered 2015-01-14: 1000 ug via INTRAMUSCULAR
  Filled 2015-01-14: qty 1

## 2015-01-14 NOTE — Progress Notes (Signed)
Bayan A Behring presents today for injection per MD orders. B12 1000mcg administered IM in left Upper Arm. Administration without incident. Patient tolerated well.  

## 2015-01-14 NOTE — Patient Instructions (Signed)
[facility] at [facility] Discharge Instructions  RECOMMENDATIONS MADE BY THE CONSULTANT AND ANY TEST RESULTS WILL BE SENT TO YOUR REFERRING PHYSICIAN.  Vitamin B12 1000 mcg injection given as ordered. Return as scheduled.  Thank you for choosing [facility] at [facility] to provide your oncology and hematology care.  To afford each patient quality time with our provider, please arrive at least 15 minutes before your scheduled appointment time.    You need to re-schedule your appointment should you arrive 10 or more minutes late.  We strive to give you quality time with our providers, and arriving late affects you and other patients whose appointments are after yours.  Also, if you no show three or more times for appointments you may be dismissed from the clinic at the providers discretion.     Again, thank you for choosing [facility].  Our hope is that these requests will decrease the amount of time that you wait before being seen by our physicians.       _____________________________________________________________  Should you have questions after your visit to [facility], please contact our office at [phone] between the hours of 8:30 a.m. and 4:30 p.m.  Voicemails left after 4:30 p.m. will not be returned until the following business day.  For prescription refill requests, have your pharmacy contact our office.    

## 2015-02-11 ENCOUNTER — Encounter (HOSPITAL_COMMUNITY): Payer: BLUE CROSS/BLUE SHIELD

## 2015-02-14 ENCOUNTER — Encounter (HOSPITAL_COMMUNITY): Payer: BLUE CROSS/BLUE SHIELD | Attending: Hematology & Oncology

## 2015-02-14 DIAGNOSIS — D509 Iron deficiency anemia, unspecified: Secondary | ICD-10-CM | POA: Insufficient documentation

## 2015-02-14 DIAGNOSIS — K909 Intestinal malabsorption, unspecified: Secondary | ICD-10-CM | POA: Insufficient documentation

## 2015-02-25 ENCOUNTER — Ambulatory Visit (HOSPITAL_COMMUNITY): Payer: BLUE CROSS/BLUE SHIELD | Admitting: Hematology & Oncology

## 2015-02-25 ENCOUNTER — Other Ambulatory Visit (HOSPITAL_COMMUNITY): Payer: BLUE CROSS/BLUE SHIELD

## 2015-03-04 ENCOUNTER — Ambulatory Visit (HOSPITAL_COMMUNITY): Payer: BLUE CROSS/BLUE SHIELD | Admitting: Hematology & Oncology

## 2015-03-04 ENCOUNTER — Other Ambulatory Visit (HOSPITAL_COMMUNITY): Payer: BLUE CROSS/BLUE SHIELD

## 2015-03-11 ENCOUNTER — Encounter (HOSPITAL_COMMUNITY): Payer: Self-pay | Admitting: Hematology & Oncology

## 2015-03-11 ENCOUNTER — Encounter (HOSPITAL_BASED_OUTPATIENT_CLINIC_OR_DEPARTMENT_OTHER): Payer: BLUE CROSS/BLUE SHIELD | Admitting: Hematology & Oncology

## 2015-03-11 ENCOUNTER — Encounter (HOSPITAL_COMMUNITY): Payer: BLUE CROSS/BLUE SHIELD | Attending: Hematology & Oncology

## 2015-03-11 ENCOUNTER — Encounter (HOSPITAL_COMMUNITY): Payer: BLUE CROSS/BLUE SHIELD

## 2015-03-11 VITALS — BP 129/68 | Resp 16 | Wt 225.0 lb

## 2015-03-11 DIAGNOSIS — Z9884 Bariatric surgery status: Secondary | ICD-10-CM

## 2015-03-11 DIAGNOSIS — E538 Deficiency of other specified B group vitamins: Secondary | ICD-10-CM

## 2015-03-11 DIAGNOSIS — D509 Iron deficiency anemia, unspecified: Secondary | ICD-10-CM | POA: Diagnosis not present

## 2015-03-11 DIAGNOSIS — K909 Intestinal malabsorption, unspecified: Secondary | ICD-10-CM | POA: Diagnosis not present

## 2015-03-11 DIAGNOSIS — K912 Postsurgical malabsorption, not elsewhere classified: Secondary | ICD-10-CM

## 2015-03-11 LAB — CBC WITH DIFFERENTIAL/PLATELET
BASOS ABS: 0 10*3/uL (ref 0.0–0.1)
Basophils Relative: 0 %
Eosinophils Absolute: 0.1 10*3/uL (ref 0.0–0.7)
Eosinophils Relative: 2 %
HCT: 38.3 % (ref 36.0–46.0)
HEMOGLOBIN: 12.8 g/dL (ref 12.0–15.0)
LYMPHS ABS: 1.7 10*3/uL (ref 0.7–4.0)
LYMPHS PCT: 31 %
MCH: 32.9 pg (ref 26.0–34.0)
MCHC: 33.4 g/dL (ref 30.0–36.0)
MCV: 98.5 fL (ref 78.0–100.0)
Monocytes Absolute: 0.4 10*3/uL (ref 0.1–1.0)
Monocytes Relative: 7 %
NEUTROS ABS: 3.3 10*3/uL (ref 1.7–7.7)
NEUTROS PCT: 60 %
Platelets: 345 10*3/uL (ref 150–400)
RBC: 3.89 MIL/uL (ref 3.87–5.11)
RDW: 12.3 % (ref 11.5–15.5)
WBC: 5.6 10*3/uL (ref 4.0–10.5)

## 2015-03-11 LAB — COMPREHENSIVE METABOLIC PANEL
ALK PHOS: 98 U/L (ref 38–126)
ALT: 16 U/L (ref 14–54)
AST: 18 U/L (ref 15–41)
Albumin: 3.7 g/dL (ref 3.5–5.0)
Anion gap: 7 (ref 5–15)
BUN: 15 mg/dL (ref 6–20)
CALCIUM: 8.7 mg/dL — AB (ref 8.9–10.3)
CO2: 27 mmol/L (ref 22–32)
CREATININE: 0.73 mg/dL (ref 0.44–1.00)
Chloride: 107 mmol/L (ref 101–111)
Glucose, Bld: 111 mg/dL — ABNORMAL HIGH (ref 65–99)
Potassium: 4.1 mmol/L (ref 3.5–5.1)
Sodium: 141 mmol/L (ref 135–145)
Total Bilirubin: 0.4 mg/dL (ref 0.3–1.2)
Total Protein: 6.6 g/dL (ref 6.5–8.1)

## 2015-03-11 LAB — VITAMIN B12: VITAMIN B 12: 238 pg/mL (ref 180–914)

## 2015-03-11 LAB — FERRITIN: FERRITIN: 112 ng/mL (ref 11–307)

## 2015-03-11 MED ORDER — CYANOCOBALAMIN 1000 MCG/ML IJ SOLN
1000.0000 ug | Freq: Once | INTRAMUSCULAR | Status: AC
  Administered 2015-03-11: 1000 ug via INTRAMUSCULAR

## 2015-03-11 MED ORDER — CYANOCOBALAMIN 1000 MCG/ML IJ SOLN
INTRAMUSCULAR | Status: AC
  Filled 2015-03-11: qty 1

## 2015-03-11 NOTE — Patient Instructions (Signed)
[facility] at [facility] Discharge Instructions  RECOMMENDATIONS MADE BY THE CONSULTANT AND ANY TEST RESULTS WILL BE SENT TO YOUR REFERRING PHYSICIAN.   Exam and discussion by Dr Galen ManilaPenland today B12 injection today Monthly b12 injections Labs next week Return to see the doctor in 6months with labs  Please call the clinic if you have any questions or concerns    Thank you for choosing [facility] at [facility] to provide your oncology and hematology care.  To afford each patient quality time with our provider, please arrive at least 15 minutes before your scheduled appointment time.   Beginning January 23rd 2017 lab work for the [facility] will be done in the  Main lab at [facility] on 1st floor. If you have a lab appointment with the Cancer Center please come in thru the  Main Entrance and check in at the main information desk  You need to re-schedule your appointment should you arrive 10 or more minutes late.  We strive to give you quality time with our providers, and arriving late affects you and other patients whose appointments are after yours.  Also, if you no show three or more times for appointments you may be dismissed from the clinic at the providers discretion.     Again, thank you for choosing [facility].  Our hope is that these requests will decrease the amount of time that you wait before being seen by our physicians.       _____________________________________________________________  Should you have questions after your visit to [facility], please contact our office at [phone] between the hours of 8:30 a.m. and 4:30 p.m.  Voicemails left after 4:30 p.m. will not be returned until the following business day.  For prescription refill requests, have your pharmacy contact our office.         Resources For Cancer Patients and their Caregivers ? American Cancer Society: Can assist with  transportation, wigs, general needs, runs Look Good Feel Better.        [phone] ? Cancer Care: Provides financial assistance, online support groups, medication/co-pay assistance.  1-800-813-HOPE (703) [phone](4673) ? [facility] Assists [place] cancer patients and their families through emotional , educational and financial support.  [phone] ? [place] DSS Where to apply for food stamps, Medicaid and utility assistance. 4043210951(236)[phone] ? RCATS: Transportation to medical appointments. [phone]) [phone] ? Social Security Administration: May apply for disability if have a Stage IV cancer. [phone] ? [place] [facility], Dis[place] and Transit Services: Assists with nutrition, care and transit needs. [phone]

## 2015-03-11 NOTE — Progress Notes (Signed)
[name], [name], DO [address] [place] [address]    DIAGNOSIS:  Iron deficiency anemia secondary to malabsorption History of gastric bypass B12 deficiency, on B12 IM Hypocalcemia Colonoscopy on 08/07/2013 with a large redundant colon and internal/external hemorrhoids EGD on 08/07/2013 with a Schatzki ring at the GE junction and small clean-based ulcer at anastomosis  CURRENT THERAPY: IV iron   INTERVAL HISTORY: April [name] 60 y.o. female returns for iron deficiency anemia. She has a history of gastric bypass. She takes a B12 supplement sublingually. She is up-to-date on well care including her mammograms.   April [name] returns to the [facility] alone today.  She says her B12 shots are going great. She admits she missed one, but otherwise thinks it's going well.  She says she's been having a lot of really bad leg cramps at night, and knows that she can be susceptible to other really bad malabsorption issues due to her gastric bypass. She notes that she eats a banana every day and tries to stay hydrated, but other than that, that's all she's done. When discussing this, the patient was informed that we don't have her potassium back yet today, and also checked a B12 and a ferritin. When asked about vitamin D, she says "I'm probably really low." She does not take folic acid.  During her physical exam, she denies any belly pain when her abdomen is palpated, though she comments that it will probably be sore tomorrow thanks to her yoga class.  Her PCP is Bufford Spikes at [facility]. She confirms that she gets her thyroid checked every year. She takes 50 micrograms of levoxyl. She is also on lexapro, reporting that she's been on it a long time -- maybe 10 years.  She is up to date on her mammogram.   MEDICAL HISTORY: Past Medical History  Diagnosis Date  . Anemia   . Hypothyroidism   . Chronic back pain     has Postoperative malabsorption; Encounter  for screening colonoscopy; Anemia; Malabsorption of iron; and H/O gastric bypass on her problem list.     has No Known Allergies.  April [name] does not currently have medications on file.  SURGICAL HISTORY: Past Surgical History  Procedure Laterality Date  . Gastric bypass  [address], [place]  . Cesarean section  [id number]  . Esophagogastroduodenoscopy    . Colonoscopy N/A 08/07/2013    Procedure: COLONOSCOPY;  Surgeon: West Bali, MD;  [facility];  Service: Endoscopy;  Laterality: N/A;  10:30-moved to [address] notified pt  . Esophagogastroduodenoscopy N/A 08/07/2013    Procedure: ESOPHAGOGASTRODUODENOSCOPY (EGD);  Surgeon: West Bali, MD;  Location: [facility];  Service: Endoscopy;  Laterality: N/A;  . Upper gi endoscopy  2004    Dr.Borhanian   . Tonsillectomy      Childhood    SOCIAL HISTORY: Social History   Social History  . Marital Status: Married    Spouse Name: N/A  . Number of Children: N/A  . Years of Education: N/A   Occupational History  . Dentist    Social History Main Topics  . Smoking status: Current Every Day Smoker -- 0.50 packs/day for 10 years    Types: Cigarettes  . Smokeless tobacco: Never Used  . Alcohol Use: No  . Drug Use: No  . Sexual Activity: Not on file   Other Topics Concern  . Not on file   Social History  Narrative   Married, 1990, has 2 children. Patient lives in a multi-level home. Previously smoked 1/2 PPD, smoked for 10-20 years. Drinks a minimal amount of caffeinated beverages.     FAMILY HISTORY: Family History  Problem Relation Age of Onset  . Heart disease Mother   . Diabetes Mother   . Stroke Mother   . Hypertension Mother   . Diabetes Father   . Heart disease Father   . Hypertension Father   . Colon cancer Neg Hx   . Migraines Daughter     Review of Systems  Constitutional: Negative for fever, chills, weight loss and malaise/fatigue.  HENT: Negative for congestion, hearing loss,  nosebleeds, sore throat and tinnitus.   Eyes: Negative for blurred vision, double vision, pain and discharge.  Respiratory: Negative for cough, hemoptysis, sputum production, shortness of breath and wheezing.   Cardiovascular: Negative for chest pain, palpitations, claudication, leg swelling and PND.  Gastrointestinal: Negative for heartburn, nausea, vomiting, abdominal pain, diarrhea, constipation, blood in stool and melena.  Genitourinary: Negative for dysuria, urgency, frequency and hematuria.  Musculoskeletal: Negative for myalgias, joint pain and falls.  Skin: Negative for itching and rash.  Neurological: Negative for dizziness, tingling, tremors, sensory change, speech change, focal weakness, seizures, loss of consciousness, weakness and headaches.  Endo/Heme/Allergies: Does not bruise/bleed easily.  Psychiatric/Behavioral: Negative for depression, suicidal ideas, memory loss and substance abuse. The patient is not nervous/anxious and does not have insomnia.    14 point review of systems was performed and is negative except as detailed under history of present illness and above   PHYSICAL EXAMINATION  ECOG PERFORMANCE STATUS: 0 - Asymptomatic  Filed Vitals:   03/11/15 1400  BP: 129/68  Resp: 16    Physical Exam  Constitutional: She is oriented to person, place, and time and well-developed, well-nourished, and in no distress.  HENT:  Head: Normocephalic and atraumatic.  Nose: Nose normal.  Mouth/Throat: Oropharynx is clear and moist. No oropharyngeal exudate.  Eyes: Conjunctivae and EOM are normal. Pupils are equal, round, and reactive to light. Right eye exhibits no discharge. Left eye exhibits no discharge. No scleral icterus.  Neck: Normal range of motion. Neck supple. No tracheal deviation present. No thyromegaly present.  Cardiovascular: Normal rate, regular rhythm and normal heart sounds.  Exam reveals no gallop and no friction rub.   No murmur heard. Pulmonary/Chest:  Effort normal and breath sounds normal. She has no wheezes. She has no rales.  Abdominal: Soft. Bowel sounds are normal. She exhibits no distension and no mass. There is no tenderness. There is no rebound and no guarding.  Musculoskeletal: Normal range of motion. She exhibits no edema.  Lymphadenopathy:    She has no cervical adenopathy.  Neurological: She is alert and oriented to person, place, and time. She has normal reflexes. No cranial nerve deficit. Gait normal. Coordination normal.  Skin: Skin is warm and dry. No rash noted.  Psychiatric: Mood, memory, affect and judgment normal.  Nursing note and vitals reviewed.   LABORATORY DATA: I have reviewed the data as listed.  CBC    Component Value Date/Time   WBC 5.6 03/11/2015 1341   RBC 3.89 03/11/2015 1341   HGB 12.8 03/11/2015 1341   HCT 38.3 03/11/2015 1341   PLT 345 03/11/2015 1341   MCV 98.5 03/11/2015 1341   MCH 32.9 03/11/2015 1341   MCHC 33.4 03/11/2015 1341   RDW 12.3 03/11/2015 1341   LYMPHSABS 1.7 03/11/2015 1341   MONOABS 0.4 03/11/2015 1341  EOSABS 0.1 03/11/2015 1341   BASOSABS 0.0 03/11/2015 1341   CMP     Component Value Date/Time   NA 141 03/11/2015 1341   K 4.1 03/11/2015 1341   CL 107 03/11/2015 1341   CO2 27 03/11/2015 1341   GLUCOSE 111* 03/11/2015 1341   BUN 15 03/11/2015 1341   CREATININE 0.73 03/11/2015 1341   CALCIUM 8.7* 03/11/2015 1341   PROT 6.6 03/11/2015 1341   ALBUMIN 3.7 03/11/2015 1341   AST 18 03/11/2015 1341   ALT 16 03/11/2015 1341   ALKPHOS 98 03/11/2015 1341   BILITOT 0.4 03/11/2015 1341   GFRNONAA >60 03/11/2015 1341   GFRAA >60 03/11/2015 1341      ASSESSMENT and THERAPY PLAN:  Gastric Bypass Iron deficiency B12 deficency Hypocalcemia Colonoscopy on 08/07/2013 with a large redundant colon and internal/external hemorrhoids EGD on 08/07/2013 with a Schatzki ring at the GE junction and small clean-based ulcer at anastomosis  60 year old female with a history of  gastric bypass and resultant iron deficiency secondary to malabsorption. She is also B12 deficiency and has been started on B12 injections after SL B12 was not sufficient, note that macrocytosis has resolved. She is overall doing well.  I advised her we will call her with her iron levels and if she needs additional IV iron replacement we will schedule that. We will continue with ongoing observation following both her CBC and iron levels. She is to continue on her B12 supplementation. She is up-to-date on her well care.  Calcium is mildly low, I have requested that she begin a calcium supplement daily.  We are checking a vitamin D level, Vitamin A level given that these vitamins can become deficient after gastric bypass. Folic acid will also be checked. She will be apprised of the results when available.  We will keep her on the same 6 month schedule with the same routine lab.  Orders Placed This Encounter  Procedures  . Vitamin D 25 hydroxy    Standing Status: Future     Number of Occurrences: 1     Standing Expiration Date: 03/10/2016  . Vitamin A    Standing Status: Future     Number of Occurrences:      Standing Expiration Date: 03/10/2016  . Vitamin B1, whole blood    Standing Status: Future     Number of Occurrences:      Standing Expiration Date: 03/10/2016  . Folate    Standing Status: Future     Number of Occurrences:      Standing Expiration Date: 03/10/2016   All questions were answered. The patient knows to call the clinic with any problems, questions or concerns. We can certainly see the patient much sooner if necessary.  This document serves as a record of services personally performed by Loma MessingShannon Penland, MD. It was created on her behalf by Peggye FothergillKatherine Galloway, a trained medical scribe. The creation of this record is based on the scribe's personal observations and the provider's statements to them. This document has been checked and approved by the attending provider.  I have  reviewed the above documentation for accuracy and completeness and I agree with the above.  This note was electronically signed. Arvil ChacoPenland,Shannon Kristen, MD  03/11/2015

## 2015-03-11 NOTE — Progress Notes (Signed)
April [name] presents today for injection per MD orders. B12 1000 mcg administered IM in right Upper Arm. Administration without incident. Patient tolerated well.  

## 2015-03-12 LAB — VITAMIN D 25 HYDROXY (VIT D DEFICIENCY, FRACTURES): Vit D, 25-Hydroxy: 16.6 ng/mL — ABNORMAL LOW (ref 30.0–100.0)

## 2015-03-14 ENCOUNTER — Other Ambulatory Visit (HOSPITAL_COMMUNITY): Payer: Self-pay | Admitting: *Deleted

## 2015-03-14 MED ORDER — ERGOCALCIFEROL 1.25 MG (50000 UT) PO CAPS: 50000.0000 [IU] | ORAL_CAPSULE | ORAL | Status: DC

## 2015-03-18 ENCOUNTER — Encounter (HOSPITAL_COMMUNITY): Payer: BLUE CROSS/BLUE SHIELD

## 2015-03-18 DIAGNOSIS — K909 Intestinal malabsorption, unspecified: Secondary | ICD-10-CM

## 2015-03-18 DIAGNOSIS — D509 Iron deficiency anemia, unspecified: Secondary | ICD-10-CM | POA: Diagnosis not present

## 2015-03-18 DIAGNOSIS — Z9884 Bariatric surgery status: Secondary | ICD-10-CM

## 2015-03-18 LAB — FOLATE: FOLATE: 9.4 ng/mL (ref >5.9)

## 2015-03-23 ENCOUNTER — Other Ambulatory Visit (HOSPITAL_COMMUNITY): Payer: Self-pay | Admitting: *Deleted

## 2015-03-23 DIAGNOSIS — K909 Intestinal malabsorption, unspecified: Secondary | ICD-10-CM

## 2015-03-23 DIAGNOSIS — D509 Iron deficiency anemia, unspecified: Secondary | ICD-10-CM

## 2015-04-08 ENCOUNTER — Encounter (HOSPITAL_BASED_OUTPATIENT_CLINIC_OR_DEPARTMENT_OTHER): Payer: BLUE CROSS/BLUE SHIELD

## 2015-04-08 ENCOUNTER — Encounter (HOSPITAL_COMMUNITY): Payer: BLUE CROSS/BLUE SHIELD

## 2015-04-08 VITALS — BP 131/96 | HR 60 | Temp 97.8°F | Resp 18

## 2015-04-08 DIAGNOSIS — D509 Iron deficiency anemia, unspecified: Secondary | ICD-10-CM | POA: Diagnosis not present

## 2015-04-08 DIAGNOSIS — E538 Deficiency of other specified B group vitamins: Secondary | ICD-10-CM | POA: Diagnosis not present

## 2015-04-08 DIAGNOSIS — Z9884 Bariatric surgery status: Secondary | ICD-10-CM

## 2015-04-08 DIAGNOSIS — K909 Intestinal malabsorption, unspecified: Secondary | ICD-10-CM

## 2015-04-08 DIAGNOSIS — K912 Postsurgical malabsorption, not elsewhere classified: Secondary | ICD-10-CM

## 2015-04-08 MED ORDER — CYANOCOBALAMIN 1000 MCG/ML IJ SOLN
1000.0000 ug | Freq: Once | INTRAMUSCULAR | Status: AC
  Administered 2015-04-08: 1000 ug via INTRAMUSCULAR
  Filled 2015-04-08: qty 1

## 2015-04-08 NOTE — Patient Instructions (Signed)
[facility] at [facility] Discharge Instructions  RECOMMENDATIONS MADE BY THE CONSULTANT AND ANY TEST RESULTS WILL BE SENT TO YOUR REFERRING PHYSICIAN.  Vitamin B12 1000 mcg injection given today as ordered. Continue monthly B12 injections. Return as scheduled.  Thank you for choosing [facility] at [facility] to provide your oncology and hematology care.  To afford each patient quality time with our provider, please arrive at least 15 minutes before your scheduled appointment time.   Beginning January 23rd 2017 lab work for the [facility] will be done in the  Main lab at [facility] on 1st floor. If you have a lab appointment with the Cancer Center please come in thru the  Main Entrance and check in at the main information desk  You need to re-schedule your appointment should you arrive 10 or more minutes late.  We strive to give you quality time with our providers, and arriving late affects you and other patients whose appointments are after yours.  Also, if you no show three or more times for appointments you may be dismissed from the clinic at the providers discretion.     Again, thank you for choosing [facility].  Our hope is that these requests will decrease the amount of time that you wait before being seen by our physicians.       _____________________________________________________________  Should you have questions after your visit to [facility], please contact our office at [phone] between the hours of 8:30 a.m. and 4:30 p.m.  Voicemails left after 4:30 p.m. will not be returned until the following business day.  For prescription refill requests, have your pharmacy contact our office.         Resources For Cancer Patients and their Caregivers ? American Cancer Society: Can assist with transportation, wigs, general needs, runs Look Good Feel Better.        [phone] ? Cancer  Care: Provides financial assistance, online support groups, medication/co-pay assistance.  1-800-813-HOPE 502-699-3290(4673) ? [facility] Assists [place] cancer patients and their families through emotional , educational and financial support.  [phone] ? [place] DSS Where to apply for food stamps, Medicaid and utility assistance. [phone]([phone])[phone] ? RCATS: Transportation to medical appointments. [phone] ? Social Security Administration: May apply for disability if have a Stage IV cancer. [phone] ? [place] [facility], Disability and Transit Services: Assists with nutrition, care and transit needs. [phone]

## 2015-04-08 NOTE — Progress Notes (Signed)
April [name] presents today for injection per MD orders. B12 1000 mcg administered IM in right Upper Arm. Administration without incident. Patient tolerated well.

## 2015-04-10 LAB — VITAMIN A: VITAMIN A (RETINOIC ACID): 38 ug/dL (ref 20–65)

## 2015-05-06 ENCOUNTER — Encounter (HOSPITAL_COMMUNITY): Payer: BLUE CROSS/BLUE SHIELD | Attending: Hematology & Oncology

## 2015-05-06 VITALS — BP 127/89 | HR 65 | Temp 98.9°F | Resp 20

## 2015-05-06 DIAGNOSIS — Z9884 Bariatric surgery status: Secondary | ICD-10-CM

## 2015-05-06 DIAGNOSIS — K912 Postsurgical malabsorption, not elsewhere classified: Secondary | ICD-10-CM

## 2015-05-06 DIAGNOSIS — E538 Deficiency of other specified B group vitamins: Secondary | ICD-10-CM

## 2015-05-06 DIAGNOSIS — K909 Intestinal malabsorption, unspecified: Secondary | ICD-10-CM | POA: Insufficient documentation

## 2015-05-06 DIAGNOSIS — D509 Iron deficiency anemia, unspecified: Secondary | ICD-10-CM | POA: Insufficient documentation

## 2015-05-06 MED ORDER — CYANOCOBALAMIN 1000 MCG/ML IJ SOLN
1000.0000 ug | Freq: Once | INTRAMUSCULAR | Status: AC
  Administered 2015-05-06: 1000 ug via INTRAMUSCULAR

## 2015-05-06 NOTE — Patient Instructions (Signed)
[facility] at [facility] Discharge Instructions  RECOMMENDATIONS MADE BY THE CONSULTANT AND ANY TEST RESULTS WILL BE SENT TO YOUR REFERRING PHYSICIAN.  Vitamin B12 1000 mcg injection given as ordered.  Thank you for choosing [facility] at [facility] to provide your oncology and hematology care.  To afford each patient quality time with our provider, please arrive at least 15 minutes before your scheduled appointment time.   Beginning January 23rd 2017 lab work for the Cancer Center will be done in the  Main lab at [facility] on 1st floor. If you have a lab appointment with the Cancer Center please come in thru the  Main Entrance and check in at the main information desk  You need to re-schedule your appointment should you arrive 10 or more minutes late.  We strive to give you quality time with our providers, and arriving late affects you and other patients whose appointments are after yours.  Also, if you no show three or more times for appointments you may be dismissed from the clinic at the providers discretion.     Again, thank you for choosing [facility].  Our hope is that these requests will decrease the amount of time that you wait before being seen by our physicians.       _____________________________________________________________  Should you have questions after your visit to [facility], please contact our office at [phone] between the hours of 8:30 a.m. and 4:30 p.m.  Voicemails left after 4:30 p.m. will not be returned until the following business day.  For prescription refill requests, have your pharmacy contact our office.         Resources For Cancer Patients and their Caregivers ? American Cancer Society: Can assist with transportation, wigs, general needs, runs Look Good Feel Better.        1-888-227-6333 ? Cancer Care: Provides financial assistance, online support groups,  medication/co-pay assistance.  1-800-813-HOPE (4673) ? [facility] Assists [place] cancer patients and their families through emotional , educational and financial support.  336-427-4357 ? [place] DSS Where to apply for food stamps, Medicaid and utility assistance. 336-342-1394 ? RCATS: Transportation to medical appointments. 336-347-2287 ? Social Security Administration: May apply for disability if have a Stage IV cancer. 336-342-7796 1-800-772-1213 ? [place] Aging, Disability and Transit Services: Assists with nutrition, care and transit needs. [phone] 

## 2015-05-06 NOTE — Progress Notes (Signed)
April Lewandowskyynthia A Jalbert presents today for injection per MD orders. B12 1000 mcg administered IM in right Upper Arm. Administration without incident. Patient tolerated well.

## 2015-06-03 ENCOUNTER — Encounter (HOSPITAL_COMMUNITY): Payer: BLUE CROSS/BLUE SHIELD | Attending: Hematology & Oncology

## 2015-06-03 VITALS — BP 131/82 | HR 64 | Temp 98.9°F | Resp 20

## 2015-06-03 DIAGNOSIS — K909 Intestinal malabsorption, unspecified: Secondary | ICD-10-CM | POA: Insufficient documentation

## 2015-06-03 DIAGNOSIS — E538 Deficiency of other specified B group vitamins: Secondary | ICD-10-CM

## 2015-06-03 DIAGNOSIS — Z9884 Bariatric surgery status: Secondary | ICD-10-CM

## 2015-06-03 DIAGNOSIS — K912 Postsurgical malabsorption, not elsewhere classified: Secondary | ICD-10-CM

## 2015-06-03 DIAGNOSIS — D509 Iron deficiency anemia, unspecified: Secondary | ICD-10-CM | POA: Insufficient documentation

## 2015-06-03 MED ORDER — CYANOCOBALAMIN 1000 MCG/ML IJ SOLN
INTRAMUSCULAR | Status: AC
Start: 2015-06-03 — End: 2015-06-03
  Filled 2015-06-03: qty 1

## 2015-06-03 MED ORDER — CYANOCOBALAMIN 1000 MCG/ML IJ SOLN
1000.0000 ug | Freq: Once | INTRAMUSCULAR | Status: AC
  Administered 2015-06-03: 1000 ug via INTRAMUSCULAR

## 2015-06-03 MED ORDER — CYANOCOBALAMIN 1000 MCG/ML IJ SOLN: 1000.0000 ug | INTRAMUSCULAR | Status: DC

## 2015-06-21 ENCOUNTER — Other Ambulatory Visit: Payer: Self-pay | Admitting: Nurse Practitioner

## 2015-07-01 ENCOUNTER — Ambulatory Visit (HOSPITAL_COMMUNITY): Payer: BLUE CROSS/BLUE SHIELD

## 2015-07-29 ENCOUNTER — Ambulatory Visit (HOSPITAL_COMMUNITY): Payer: BLUE CROSS/BLUE SHIELD

## 2015-09-09 ENCOUNTER — Encounter (HOSPITAL_COMMUNITY): Payer: BLUE CROSS/BLUE SHIELD

## 2015-09-09 ENCOUNTER — Encounter (HOSPITAL_COMMUNITY): Payer: BLUE CROSS/BLUE SHIELD | Attending: Hematology & Oncology | Admitting: Hematology & Oncology

## 2015-09-09 VITALS — BP 132/74 | HR 64 | Temp 98.7°F | Resp 16 | Ht 68.5 in | Wt 229.0 lb

## 2015-09-09 DIAGNOSIS — Z9884 Bariatric surgery status: Secondary | ICD-10-CM

## 2015-09-09 DIAGNOSIS — K909 Intestinal malabsorption, unspecified: Secondary | ICD-10-CM

## 2015-09-09 DIAGNOSIS — K912 Postsurgical malabsorption, not elsewhere classified: Secondary | ICD-10-CM

## 2015-09-09 DIAGNOSIS — E559 Vitamin D deficiency, unspecified: Secondary | ICD-10-CM

## 2015-09-09 DIAGNOSIS — D509 Iron deficiency anemia, unspecified: Secondary | ICD-10-CM | POA: Diagnosis not present

## 2015-09-09 DIAGNOSIS — E538 Deficiency of other specified B group vitamins: Secondary | ICD-10-CM | POA: Diagnosis not present

## 2015-09-09 LAB — COMPREHENSIVE METABOLIC PANEL
ALK PHOS: 81 U/L (ref 38–126)
ALT: 17 U/L (ref 14–54)
ANION GAP: 5 (ref 5–15)
AST: 18 U/L (ref 15–41)
Albumin: 3.4 g/dL — ABNORMAL LOW (ref 3.5–5.0)
BILIRUBIN TOTAL: 0.4 mg/dL (ref 0.3–1.2)
BUN: 13 mg/dL (ref 6–20)
CALCIUM: 8.5 mg/dL — AB (ref 8.9–10.3)
CO2: 28 mmol/L (ref 22–32)
CREATININE: 0.95 mg/dL (ref 0.44–1.00)
Chloride: 109 mmol/L (ref 101–111)
Glucose, Bld: 148 mg/dL — ABNORMAL HIGH (ref 65–99)
Potassium: 3.7 mmol/L (ref 3.5–5.1)
Sodium: 142 mmol/L (ref 135–145)
TOTAL PROTEIN: 6 g/dL — AB (ref 6.5–8.1)

## 2015-09-09 LAB — VITAMIN B12: VITAMIN B 12: 295 pg/mL (ref 180–914)

## 2015-09-09 LAB — CBC WITH DIFFERENTIAL/PLATELET
Basophils Absolute: 0 10*3/uL (ref 0.0–0.1)
Basophils Relative: 0 %
Eosinophils Absolute: 0.1 10*3/uL (ref 0.0–0.7)
Eosinophils Relative: 2 %
HEMATOCRIT: 36.4 % (ref 36.0–46.0)
HEMOGLOBIN: 12 g/dL (ref 12.0–15.0)
LYMPHS ABS: 1.4 10*3/uL (ref 0.7–4.0)
LYMPHS PCT: 23 %
MCH: 32.5 pg (ref 26.0–34.0)
MCHC: 33 g/dL (ref 30.0–36.0)
MCV: 98.6 fL (ref 78.0–100.0)
MONOS PCT: 6 %
Monocytes Absolute: 0.4 10*3/uL (ref 0.1–1.0)
NEUTROS PCT: 69 %
Neutro Abs: 4.1 10*3/uL (ref 1.7–7.7)
Platelets: 316 10*3/uL (ref 150–400)
RBC: 3.69 MIL/uL — AB (ref 3.87–5.11)
RDW: 12.5 % (ref 11.5–15.5)
WBC: 6.1 10*3/uL (ref 4.0–10.5)

## 2015-09-09 LAB — FERRITIN: FERRITIN: 88 ng/mL (ref 11–307)

## 2015-09-09 MED ORDER — VITAMIN B-12 100 MCG PO TABS: 100.0000 ug | ORAL_TABLET | Freq: Every day | ORAL | 5 refills | Status: DC

## 2015-09-09 MED ORDER — ERGOCALCIFEROL 1.25 MG (50000 UT) PO CAPS: 50000.0000 [IU] | ORAL_CAPSULE | ORAL | 5 refills | Status: DC

## 2015-09-09 NOTE — Patient Instructions (Signed)
[facility] at [facility] Discharge Instructions  RECOMMENDATIONS MADE BY THE CONSULTANT AND ANY TEST RESULTS WILL BE SENT TO YOUR REFERRING PHYSICIAN.  You saw Dr. Galen ManilaPenland today. Follow up in 6 months with labs. You are being referred to Dr. Neale BurlyFreeman [place] [facility] [place]. Your vitamin D and B12 refills were sent to your pharmacy.  Thank you for choosing [facility] at [facility] to provide your oncology and hematology care.  To afford each patient quality time with our provider, please arrive at least 15 minutes before your scheduled appointment time.   Beginning January 23rd 2017 lab work for the [facility] will be done in the  Main lab at [facility] on 1st floor. If you have a lab appointment with the Cancer Center please come in thru the  Main Entrance and check in at the main information desk  You need to re-schedule your appointment should you arrive 10 or more minutes late.  We strive to give you quality time with our providers, and arriving late affects you and other patients whose appointments are after yours.  Also, if you no show three or more times for appointments you may be dismissed from the clinic at the providers discretion.     Again, thank you for choosing [facility].  Our hope is that these requests will decrease the amount of time that you wait before being seen by our physicians.       _____________________________________________________________  Should you have questions after your visit to [facility], please contact our office at [phone] between the hours of 8:30 a.m. and 4:30 p.m.  Voicemails left after 4:30 p.m. will not be returned until the following business day.  For prescription refill requests, have your pharmacy contact our office.         Resources For Cancer Patients and their Caregivers ? American Cancer Society: Can assist with transportation,  wigs, general needs, runs Look Good Feel Better.        [phone] ? Cancer Care: Provides financial assistance, online support groups, medication/co-pay assistance.  1-800-813-HOPE (510)542-9737(4673) ? [facility] Assists [place] cancer patients and their families through emotional , educational and financial support.  [phone] ? [place] DSS Where to apply for food stamps, Medicaid and utility assistance. ([phone] [phone] ? RCATS: Transportation to medical appointments. 206-684-2872([phone])[phone] ? Social Security Administration: May apply for disability if have a Stage IV cancer. [phone](910)800-0520 [phone] ? [place] [facility], Disability and Transit Services: Assists with nutrition, care and transit needs. [phone]  Cancer Center Support Programs: @10RELATIVEDAYS @ > Cancer Support Group  2nd Tuesday of the month 1pm-2pm, Journey Room  > Creative Journey  3rd Tuesday of the month 1130am-1pm, Journey Room  > Look Good Feel Better  1st Wednesday of the month 10am-12 noon, Journey Room (Call American Cancer Society to register [phone])

## 2015-09-09 NOTE — Progress Notes (Signed)
[name], [name], DO [address] [place] [id number]    DIAGNOSIS:  Iron deficiency anemia secondary to malabsorption History of gastric bypass B12 deficiency, on B12 IM Hypocalcemia Colonoscopy on 08/07/2013 with a large redundant colon and internal/external hemorrhoids EGD on 08/07/2013 with a Schatzki ring at the GE junction and small clean-based ulcer at anastomosis  CURRENT THERAPY: IV iron   INTERVAL HISTORY: April LewandowskyCynthia A [name] 60 y.o. female returns for iron deficiency anemia. She has a history of gastric bypass. She is on IM B12. She is up-to-date on well care including her mammograms.  April [name] is unaccompanied. I personally reviewed and went over laboratory studies with the patient.  She went to yoga this morning. She has felt better with iron infusions.  She reports headaches. She originally believed they were sinus headaches so she saw Dr. Suszanne Connerseoh who found nothing wrong with her sinuses. She wakes up in the mornings with a headache. Reports she has a headache now. She was not sure if she should start with her PCP or a headache specialist so she waited until our visit today to discuss this.   She typically has her mammograms done in December.   [place]  She denies any new lumps or bumps. She denies any blood in stool. No pagophagia or pica.    MEDICAL HISTORY: Past Medical History:  Diagnosis Date  . Anemia   . Chronic back pain   . Hypothyroidism     has Postoperative malabsorption; Encounter for screening colonoscopy; Anemia; Malabsorption of iron; and H/O gastric bypass on her problem list.     has No Known Allergies.  [name] does not currently have medications on file.  SURGICAL HISTORY: Past Surgical History:  Procedure Laterality Date  . CESAREAN SECTION  [id number],1991  . COLONOSCOPY N/A 08/07/2013   Procedure: COLONOSCOPY;  Surgeon: West BaliSandi L Fields, MD;  [facility];  Service: Endoscopy;  Laterality:  N/A;  10:30-moved to [address] notified pt  . ESOPHAGOGASTRODUODENOSCOPY    . ESOPHAGOGASTRODUODENOSCOPY N/A 08/07/2013   Procedure: ESOPHAGOGASTRODUODENOSCOPY (EGD);  Surgeon: West BaliSandi L Fields, MD;  [facility];  Service: Endoscopy;  Laterality: N/A;  . GASTRIC BYPASS  [address]   [place]  . TONSILLECTOMY     Childhood  . UPPER GI ENDOSCOPY  2004   Dr.Borhanian     SOCIAL HISTORY: Social History   Social History  . Marital status: Married    Spouse name: N/A  . Number of children: N/A  . Years of education: N/A   Occupational History  . Dentist [employer]   Social History Main Topics  . Smoking status: Current Every Day Smoker    Packs/day: 0.50    Years: 10.00    Types: Cigarettes  . Smokeless tobacco: Never Used  . Alcohol use No  . Drug use: No  . Sexual activity: Not on file   Other Topics Concern  . Not on file   Social History Narrative   Married, 1990, has 2 children. Patient lives in a multi-level home. Previously smoked 1/2 PPD, smoked for 10-20 years. Drinks a minimal amount of caffeinated beverages.     FAMILY HISTORY: Family History  Problem Relation Age of Onset  . Heart disease Mother   . Diabetes Mother   . Stroke Mother   . Hypertension Mother   . Diabetes Father   . Heart disease Father   . Hypertension Father   .  Colon cancer Neg Hx   . Migraines Daughter     Review of Systems  Constitutional: Negative for fever, chills, weight loss and malaise/fatigue.  HENT: Negative for congestion, hearing loss, nosebleeds, sore throat and tinnitus.   Eyes: Negative for blurred vision, double vision, pain and discharge.  Respiratory: Negative for cough, hemoptysis, sputum production, shortness of breath and wheezing.   Cardiovascular: Negative for chest pain, palpitations, claudication, leg swelling and PND.  Gastrointestinal: Negative for heartburn, nausea, vomiting, abdominal pain, diarrhea, constipation, blood in stool and  melena.  Genitourinary: Negative for dysuria, urgency, frequency and hematuria.  Musculoskeletal: Negative for myalgias, joint pain and falls.  Skin: Negative for itching and rash.  Neurological: Positive for headaches. Negative for dizziness, tingling, tremors, sensory change, speech change, focal weakness, seizures, loss of consciousness, weakness.  Endo/Heme/Allergies: Does not bruise/bleed easily.  Psychiatric/Behavioral: Negative for depression, suicidal ideas, memory loss and substance abuse. The patient is not nervous/anxious and does not have insomnia.    14 point review of systems was performed and is negative except as detailed under history of present illness and above   PHYSICAL EXAMINATION  ECOG PERFORMANCE STATUS: 0 - Asymptomatic  Vitals:   09/09/15 1340  BP: 132/74  Pulse: 64  Resp: 16  Temp: 98.7 F (37.1 C)    Physical Exam  Constitutional: She is oriented to person, place, and time and well-developed, well-nourished, and in no distress.  HENT:  Head: Normocephalic and atraumatic.  Nose: Nose normal.  Mouth/Throat: Oropharynx is clear and moist. No oropharyngeal exudate.  Eyes: Conjunctivae and EOM are normal. Pupils are equal, round, and reactive to light. Right eye exhibits no discharge. Left eye exhibits no discharge. No scleral icterus.  Neck: Normal range of motion. Neck supple. No tracheal deviation present. No thyromegaly present.  Cardiovascular: Normal rate, regular rhythm and normal heart sounds.  Exam reveals no gallop and no friction rub.   No murmur heard. Pulmonary/Chest: Effort normal and breath sounds normal. She has no wheezes. She has no rales.  Abdominal: Soft. Bowel sounds are normal. She exhibits no distension and no mass. There is no tenderness. There is no rebound and no guarding.  Musculoskeletal: Normal range of motion. She exhibits no edema.  Lymphadenopathy:    She has no cervical adenopathy.  Neurological: She is alert and oriented  to person, place, and time. She has normal reflexes. No cranial nerve deficit. Gait normal. Coordination normal.  Skin: Skin is warm and dry. No rash noted.  Psychiatric: Mood, memory, affect and judgment normal.  Nursing note and vitals reviewed.   LABORATORY DATA: I have reviewed the data as listed.  CBC    Component Value Date/Time   WBC 6.1 09/09/2015 1247   RBC 3.69 (L) 09/09/2015 1247   HGB 12.0 09/09/2015 1247   HCT 36.4 09/09/2015 1247   PLT 316 09/09/2015 1247   MCV 98.6 09/09/2015 1247   MCH 32.5 09/09/2015 1247   MCHC 33.0 09/09/2015 1247   RDW 12.5 09/09/2015 1247   LYMPHSABS 1.4 09/09/2015 1247   MONOABS 0.4 09/09/2015 1247   EOSABS 0.1 09/09/2015 1247   BASOSABS 0.0 09/09/2015 1247   CMP     Component Value Date/Time   NA 142 09/09/2015 1247   K 3.7 09/09/2015 1247   CL 109 09/09/2015 1247   CO2 28 09/09/2015 1247   GLUCOSE 148 (H) 09/09/2015 1247   BUN 13 09/09/2015 1247   CREATININE 0.95 09/09/2015 1247   CALCIUM 8.5 (L) 09/09/2015 1247  PROT 6.0 (L) 09/09/2015 1247   ALBUMIN 3.4 (L) 09/09/2015 1247   AST 18 09/09/2015 1247   ALT 17 09/09/2015 1247   ALKPHOS 81 09/09/2015 1247   BILITOT 0.4 09/09/2015 1247   GFRNONAA >60 09/09/2015 1247   GFRAA >60 09/09/2015 1247     ASSESSMENT and THERAPY PLAN:  Gastric Bypass Iron deficiency B12 deficency Hypocalcemia Colonoscopy on 08/07/2013 with a large redundant colon and internal/external hemorrhoids EGD on 08/07/2013 with a Schatzki ring at the GE junction and small clean-based ulcer at anastomosis Vitamin D deficiency  60 year old female with a history of gastric bypass and resultant iron deficiency secondary to malabsorption. She is also B12 deficiency and has been started on B12 injections after SL B12 was not sufficient, note that macrocytosis has resolved. She is overall doing well.  I advised her we will call her with her iron levels and if she needs additional IV iron replacement we will  schedule that. We will continue with ongoing observation following both her CBC and iron levels. She is to continue on her B12 supplementation. She is up-to-date on her well care.  I have refilled her B12 and Vitamin D today.  I spoke with the patient about a referral to Dr. Santiago Glad for her headaches. She has had them chronically. She would like a referral.   RTC 6 months with repeat labs.  Orders Placed This Encounter  Procedures  . CBC with Differential    Standing Status:   Future    Standing Expiration Date:   09/08/2016  . Vitamin B12    Standing Status:   Future    Standing Expiration Date:   09/08/2016  . Ferritin    Standing Status:   Future    Standing Expiration Date:   09/08/2016  . Iron and TIBC    Standing Status:   Future    Standing Expiration Date:   09/08/2016  . Vitamin D 25 hydroxy    Standing Status:   Future    Standing Expiration Date:   09/08/2016   All questions were answered. The patient knows to call the clinic with any problems, questions or concerns. We can certainly see the patient much sooner if necessary.  This document serves as a record of services personally performed by Loma Messing, MD. It was created on her behalf by Delana Meyer, a trained medical scribe. The creation of this record is based on the scribe's personal observations and the provider's statements to them. This document has been checked and approved by the attending provider.  I have reviewed the above documentation for accuracy and completeness and I agree with the above.  This note was electronically signed. Arvil Chaco, MD  09/09/2015

## 2015-09-13 ENCOUNTER — Encounter (HOSPITAL_COMMUNITY): Payer: Self-pay | Admitting: Lab

## 2015-09-13 NOTE — Progress Notes (Unsigned)
Referral sent to Headache clinic fax [phone].  Records faxed on 9/5.  They will contact patient with appt.

## 2015-09-18 ENCOUNTER — Encounter (HOSPITAL_COMMUNITY): Payer: Self-pay | Admitting: Hematology & Oncology

## 2015-09-19 ENCOUNTER — Telehealth: Payer: Self-pay

## 2015-09-19 NOTE — Telephone Encounter (Signed)
I called patient to try to schedule an annual appointment in November at the request of Dr. Renato Gailseed.   "Please call April RueCynthia [name] and ask her to come in for an annual exam in November. She is listed under me but last saw [name] for her physical. It's fine with me if she sees either one of us, but she must come once a year to remain a patient."   Left a message on voicemail for patient.

## 2015-09-20 ENCOUNTER — Other Ambulatory Visit: Payer: Self-pay

## 2015-09-20 DIAGNOSIS — E119 Type 2 diabetes mellitus without complications: Secondary | ICD-10-CM

## 2015-09-20 DIAGNOSIS — Z0000 Encounter for general adult medical examination without abnormal findings: Secondary | ICD-10-CM

## 2015-11-18 ENCOUNTER — Other Ambulatory Visit: Payer: BLUE CROSS/BLUE SHIELD

## 2015-11-18 DIAGNOSIS — E119 Type 2 diabetes mellitus without complications: Secondary | ICD-10-CM

## 2015-11-18 DIAGNOSIS — Z0000 Encounter for general adult medical examination without abnormal findings: Secondary | ICD-10-CM

## 2015-11-18 LAB — LIPID PANEL
Cholesterol: 181 mg/dL (ref <200)
HDL: 53 mg/dL (ref >50)
LDL Cholesterol: 108 mg/dL — ABNORMAL HIGH (ref <100)
Total CHOL/HDL Ratio: 3.4 Ratio (ref <5.0)
Triglycerides: 101 mg/dL (ref <150)
VLDL: 20 mg/dL (ref <30)

## 2015-11-19 LAB — HEMOGLOBIN A1C
Hgb A1c MFr Bld: 5.2 % (ref <5.7)
Mean Plasma Glucose: 103 mg/dL

## 2015-11-21 ENCOUNTER — Encounter: Payer: Self-pay | Admitting: Nurse Practitioner

## 2015-11-21 ENCOUNTER — Encounter: Payer: Self-pay | Admitting: Internal Medicine

## 2015-11-21 ENCOUNTER — Ambulatory Visit (INDEPENDENT_AMBULATORY_CARE_PROVIDER_SITE_OTHER): Payer: BLUE CROSS/BLUE SHIELD | Admitting: Nurse Practitioner

## 2015-11-21 ENCOUNTER — Encounter: Payer: Self-pay | Admitting: *Deleted

## 2015-11-21 VITALS — BP 122/84 | HR 62 | Temp 98.1°F | Resp 17 | Ht 69.0 in | Wt 226.8 lb

## 2015-11-21 DIAGNOSIS — F418 Other specified anxiety disorders: Secondary | ICD-10-CM | POA: Diagnosis not present

## 2015-11-21 DIAGNOSIS — Z23 Encounter for immunization: Secondary | ICD-10-CM

## 2015-11-21 DIAGNOSIS — Z7189 Other specified counseling: Secondary | ICD-10-CM

## 2015-11-21 DIAGNOSIS — F329 Major depressive disorder, single episode, unspecified: Secondary | ICD-10-CM

## 2015-11-21 DIAGNOSIS — F419 Anxiety disorder, unspecified: Secondary | ICD-10-CM

## 2015-11-21 DIAGNOSIS — E785 Hyperlipidemia, unspecified: Secondary | ICD-10-CM

## 2015-11-21 DIAGNOSIS — Z7184 Encounter for health counseling related to travel: Secondary | ICD-10-CM

## 2015-11-21 DIAGNOSIS — F17200 Nicotine dependence, unspecified, uncomplicated: Secondary | ICD-10-CM | POA: Diagnosis not present

## 2015-11-21 DIAGNOSIS — E119 Type 2 diabetes mellitus without complications: Secondary | ICD-10-CM | POA: Diagnosis not present

## 2015-11-21 DIAGNOSIS — F32A Depression, unspecified: Secondary | ICD-10-CM

## 2015-11-21 MED ORDER — BUPROPION HCL ER (XL) 150 MG PO TB24: 150.0000 mg | ORAL_TABLET | Freq: Every day | ORAL | 1 refills | Status: DC

## 2015-11-21 NOTE — Progress Notes (Signed)
Careteam: Patient Care Team: Kermit Baloiffany L [name], DO as PCP - General (Geriatric Medicine)  Advanced Directive information Does patient have an advance directive?: Yes, Type of Advance Directive: Healthcare Power of [place];Living will  No Known Allergies  Chief Complaint  Patient presents with  . Medical Management of Chronic Issues  . Other    Wants flu and pneu vaccine. review vaccine record for international travel     HPI: Patient is a 60 y.o. female seen in the office today for routine follow up. Pt with hx of gastric bypass, depression, diabetes  Chronic anemia with gastric bypass- seeing hematologist, getting ferritin infusion.  Diabetes- prior to gastric bypass, diet controlled at this time. Previously on medication, insulin dependant during pregnancy and oral hypoglycemics   Going to [place] in June [place], question about altitude sickness. Goal is to quit smoking before she goes.   Seeing Dr Neale BurlyFreeman for migraine management. Zonisamide 100 mg qhs for this and having trigger point injections.   Hypothyroidism- synthroid 50 mcg daily.   Review of Systems:  Review of Systems  Constitutional: Negative for activity change, appetite change, fatigue and unexpected weight change.  HENT: Negative for congestion and hearing loss.   Eyes: Negative.   Respiratory: Negative for cough and shortness of breath.   Cardiovascular: Negative for chest pain, palpitations and leg swelling.  Gastrointestinal: Negative for abdominal pain, constipation and diarrhea.  Genitourinary: Negative for difficulty urinating and dysuria.  Musculoskeletal: Negative for arthralgias and myalgias.  Skin: Negative for color change and wound.  Neurological: Negative for dizziness and weakness.  Psychiatric/Behavioral: Negative for agitation, behavioral problems and confusion.    Past Medical History:  Diagnosis Date  . Anemia   . Chronic back pain   . Hypothyroidism    Past Surgical History:    Procedure Laterality Date  . CESAREAN SECTION  [id number],1991  . COLONOSCOPY N/A 08/07/2013   Procedure: COLONOSCOPY;  Surgeon: West BaliSandi L Fields, MD;  [facility];  Service: Endoscopy;  Laterality: N/A;  10:30-moved to [address] notified pt  . ESOPHAGOGASTRODUODENOSCOPY    . ESOPHAGOGASTRODUODENOSCOPY N/A 08/07/2013   Procedure: ESOPHAGOGASTRODUODENOSCOPY (EGD);  Surgeon: West BaliSandi L Fields, MD;  [facility];  Service: Endoscopy;  Laterality: N/A;  . GASTRIC BYPASS  [address]2004   [place], [place]  . TONSILLECTOMY     Childhood  . UPPER GI ENDOSCOPY  2004   Dr.Borhanian    Social History:   reports that she has been smoking Cigarettes.  She has a 5.00 pack-year smoking history. She has never used smokeless tobacco. She reports that she does not drink alcohol or use drugs.  Family History  Problem Relation Age of Onset  . Heart disease Mother   . Diabetes Mother   . Stroke Mother   . Hypertension Mother   . Diabetes Father   . Heart disease Father   . Hypertension Father   . Migraines Daughter   . Colon cancer Neg Hx     Medications: Patient's Medications  New Prescriptions   No medications on file  Previous Medications   ACETAMINOPHEN (TYLENOL) 500 MG TABLET    Take 500 mg by mouth every 6 (six) hours as needed for mild pain or headache.   CYANOCOBALAMIN (,VITAMIN B-12,) 1000 MCG/ML INJECTION    Inject 1 mL (1,000 mcg total) into the muscle every 30 (thirty) days.   ERGOCALCIFEROL (VITAMIN D2) 50000 UNITS CAPSULE    Take 1 capsule (50,000 Units total) by mouth once  a week.   ESCITALOPRAM (LEXAPRO) 10 MG TABLET    TAKE ONE TABLET BY MOUTH ONCE DAILY.   LEVOTHYROXINE (SYNTHROID, LEVOTHROID) 50 MCG TABLET    TAKE ONE TABLET BY MOUTH ONCE DAILY.   VITAMIN B-12 (CYANOCOBALAMIN) 100 MCG TABLET    Take 1 tablet (100 mcg total) by mouth daily. Uses sublinquil   ZONISAMIDE (ZONEGRAN) 25 MG CAPSULE    Take 100 mg by mouth at bedtime.  Modified Medications   No medications on  file  Discontinued Medications   No medications on file     Physical Exam:  Vitals:   11/21/15 0914  BP: 122/84  Pulse: 62  Resp: 17  Temp: 98.1 F (36.7 C)  TempSrc: Oral  SpO2: 98%  Weight: 226 lb 12.8 oz (102.9 kg)  Height: 5\' 9"  (1.753 m)   Body mass index is 33.49 kg/m.  Physical Exam  Constitutional: She is oriented to person, place, and time. She appears well-developed and well-nourished. No distress.  HENT:  Head: Normocephalic and atraumatic.  Mouth/Throat: Oropharynx is clear and moist. No oropharyngeal exudate.  Eyes: Conjunctivae are normal. Pupils are equal, round, and reactive to light.  Neck: Normal range of motion. Neck supple.  Cardiovascular: Normal rate, regular rhythm and normal heart sounds.   Pulmonary/Chest: Effort normal and breath sounds normal.  Abdominal: Soft. Bowel sounds are normal.  Genitourinary: Vagina normal and uterus normal.  Musculoskeletal: Normal range of motion. She exhibits no edema or tenderness.  Neurological: She is alert and oriented to person, place, and time.  Normal monofilament   Skin: Skin is warm and dry. She is not diaphoretic.  Psychiatric: She has a normal mood and affect.    Labs reviewed: Basic Metabolic Panel:  Recent Labs  40/98/11 1000 03/11/15 1341 09/09/15 1247  NA  --  141 142  K  --  4.1 3.7  CL  --  107 109  CO2  --  27 28  GLUCOSE  --  111* 148*  BUN  --  15 13  CREATININE  --  0.73 0.95  CALCIUM  --  8.7* 8.5*  TSH 2.370  --   --    Liver Function Tests:  Recent Labs  03/11/15 1341 09/09/15 1247  AST 18 18  ALT 16 17  ALKPHOS 98 81  BILITOT 0.4 0.4  PROT 6.6 6.0*  ALBUMIN 3.7 3.4*   No results for input(s): LIPASE, AMYLASE in the last 8760 hours. No results for input(s): AMMONIA in the last 8760 hours. CBC:  Recent Labs  03/11/15 1341 09/09/15 1247  WBC 5.6 6.1  NEUTROABS 3.3 4.1  HGB 12.8 12.0  HCT 38.3 36.4  MCV 98.5 98.6  PLT 345 316   Lipid Panel:  Recent  Labs  11/26/14 1000 11/18/15 1001  CHOL 178 181  HDL 60 53  LDLCALC 98 108*  TRIG 99 101  CHOLHDL 3.0 3.4   TSH:  Recent Labs  11/26/14 1000  TSH 2.370   A1C: Lab Results  Component Value Date   HGBA1C 5.2 11/18/2015     Assessment/Plan 1. Need for pneumococcal vaccine - Pneumococcal conjugate vaccine 13-valent  2. Encounter for immunization - Pneumococcal conjugate vaccine 13-valent - Flu Vaccine QUAD 36+ mos IM  3. Anxiety and depression -stable, conts on lexapro   4. Smoker -motivated to quit, will start Wellbutrin XL. To set stop date 1 week after starting Wellbutrin, may cut back on cigarettes before stop date if feeling less of an urge.  -  buPROPion (WELLBUTRIN XL) 150 MG 24 hr tablet; Take 1 tablet (150 mg total) by mouth daily.  Dispense: 30 tablet; Refill: 1 Can increase to wellbutrin XL 150 mg 2 tablets q am if needed for smoking cessation.   5. Type 2 diabetes mellitus without complication, without long-term current use of insulin (HCC) -diet controlled after bariatric bypass surgery - Microalbumin, urine  6. Hyperlipidemia, unspecified hyperlipidemia type LDL slightly worse than previous, discussed diet modifications.   7. Travel advice Travel clinic number given for vaccines -discussed altitude sickness and information given from up to date encouraged smoking cessation prior to trip  Follow up 4 weeks for smoking cessation   Zamantha Strebel K. Biagio BorgEubanks, AGNP  [facility] [phone](Monday-Friday 8 am - 5 pm) [phone] (after hours)

## 2015-11-21 NOTE — Patient Instructions (Addendum)
[facility]- [phone]   Smoking cessation Wellbutrin: take 150 mg once daily for 3 days; may increase to 2 tablets 150 mg daily (maximum dose: 300 mg/day).   Wellbutrin should begin at least 1 week before target quit date. Target quit dates are generally in the second week of treatment. If you have not stopped smoking by the 7th week you are unlikely to have success with this treatment     Heart-Healthy Eating Plan Many factors influence your heart health, including eating and exercise habits. Heart (coronary) risk increases with abnormal blood fat (lipid) levels. Heart-healthy meal planning includes limiting unhealthy fats, increasing healthy fats, and making other small dietary changes. This includes maintaining a healthy body weight to help keep lipid levels within a normal range.  WHAT TYPES OF FAT SHOULD I CHOOSE?  Choose healthy fats more often. Choose monounsaturated and polyunsaturated fats, such as olive oil and canola oil, flaxseeds, walnuts, almonds, and seeds.  Eat more omega-3 fats. Good choices include salmon, mackerel, sardines, tuna, flaxseed oil, and ground flaxseeds. Aim to eat fish at least two times each week.  Limit saturated fats. Saturated fats are primarily found in animal products, such as meats, butter, and cream. Plant sources of saturated fats include palm oil, palm kernel oil, and coconut oil.  Avoid foods with partially hydrogenated oils in them. These contain trans fats. Examples of foods that contain trans fats are stick margarine, some tub margarines, cookies, crackers, and other baked goods. WHAT GENERAL GUIDELINES DO I NEED TO FOLLOW?  Check food labels carefully to identify foods with trans fats or high amounts of saturated fat.  Fill one half of your plate with vegetables and green salads. Eat 4-5 servings of vegetables per day. A serving of vegetables equals 1 cup of raw leafy vegetables,  cup of raw or cooked cut-up vegetables, or  cup of  vegetable juice.  Fill one fourth of your plate with whole grains. Look for the word "whole" as the first word in the ingredient list.  Fill one fourth of your plate with lean protein foods.  Eat 4-5 servings of fruit per day. A serving of fruit equals one medium whole fruit,  cup of dried fruit,  cup of fresh, frozen, or canned fruit, or  cup of 100% fruit juice.  Eat more foods that contain soluble fiber. Examples of foods that contain this type of fiber are apples, broccoli, carrots, beans, peas, and barley. Aim to get 20-30 g of fiber per day.  Eat more home-cooked food and less restaurant, buffet, and fast food.  Limit or avoid alcohol.  Limit foods that are high in starch and sugar.  Avoid fried foods.  Cook foods by using methods other than frying. Baking, boiling, grilling, and broiling are all great options. Other fat-reducing suggestions include:  Removing the skin from poultry.  Removing all visible fats from meats.  Skimming the fat off of stews, soups, and gravies before serving them.  Steaming vegetables in water or broth.  Lose weight if you are overweight. Losing just 5-10% of your initial body weight can help your overall health and prevent diseases such as diabetes and heart disease.  Increase your consumption of nuts, legumes, and seeds to 4-5 servings per week. One serving of dried beans or legumes equals  cup after being cooked, one serving of nuts equals 1 ounces, and one serving of seeds equals  ounce or 1 tablespoon.  You may need to monitor your salt (sodium) intake, especially if  you have high blood pressure. Talk with your health care provider or dietitian to get more information about reducing sodium. WHAT FOODS CAN I EAT? Grains Breads, including [place], white, pita, wheat, raisin, rye, oatmeal, and [place]. Tortillas that are neither fried nor made with lard or trans fat. Low-fat rolls, including hotdog and hamburger buns and English muffins.  Biscuits. Muffins. Waffles. Pancakes. Light popcorn. Whole-grain cereals. Flatbread. Melba toast. Pretzels. Breadsticks. Rusks. Low-fat snacks and crackers, including oyster, saltine, matzo, graham, animal, and rye. Rice and pasta, including brown rice and those that are made with whole wheat. Vegetables All vegetables. Fruits All fruits, but limit coconut. Meats and Other Protein Sources Lean, well-trimmed beef, veal, pork, and lamb. Chicken and Mal[place]key without skin. All fish and shellfish. Wild duck, rabbit, pheasant, and venison. Egg whites or low-cholesterol egg substitutes. Dried beans, peas, lentils, and tofu.Seeds and most nuts. Dairy Low-fat or nonfat cheeses, including ricotta, string, and mozzarella. Skim or 1% milk that is liquid, powdered, or evaporated. Buttermilk that is made with low-fat milk. Nonfat or low-fat yogurt. Beverages Mineral water. Diet carbonated beverages. Sweets and Desserts Sherbets and fruit ices. Honey, jam, marmalade, jelly, and syrups. Meringues and gelatins. Pure sugar candy, such as hard candy, jelly beans, gumdrops, mints, marshmallows, and small amounts of dark chocolate. MGM MIRAGEngel food cake. Eat all sweets and desserts in moderation. Fats and Oils Nonhydrogenated (trans-free) margarines. Vegetable oils, including soybean, sesame, sunflower, olive, peanut, safflower, corn, canola, and cottonseed. Salad dressings or mayonnaise that are made with a vegetable oil. Limit added fats and oils that you use for cooking, baking, salads, and as spreads. Other Cocoa powder. Coffee and tea. All seasonings and condiments. The items listed above may not be a complete list of recommended foods or beverages. Contact your dietitian for more options. WHAT FOODS ARE NOT RECOMMENDED? Grains Breads that are made with saturated or trans fats, oils, or whole milk. Croissants. Butter rolls. Cheese breads. Sweet rolls. Donuts. Buttered popcorn. Chow mein noodles. High-fat crackers,  such as cheese or butter crackers. Meats and Other Protein Sources Fatty meats, such as hotdogs, short ribs, sausage, spareribs, bacon, ribeye roast or steak, and mutton. High-fat deli meats, such as salami and bologna. Caviar. Domestic duck and goose. Organ meats, such as kidney, liver, sweetbreads, brains, gizzard, chitterlings, and heart. Dairy Cream, sour cream, cream cheese, and creamed cottage cheese. Whole milk cheeses, including blue (bleu), [address]rey Jack, [place]rie, [place], [address], [place]arti, [address], Al[place]acheddar, Gry[place]embert, and [place]. Whole or 2% milk that is liquid, evaporated, or condensed. Whole buttermilk. Cream sauce or high-fat cheese sauce. Yogurt that is made from whole milk. Beverages Regular sodas and drinks with added sugar. Sweets and Desserts Frosting. Pudding. Cookies. Cakes other than angel food cake. Candy that has milk chocolate or white chocolate, hydrogenated fat, butter, coconut, or unknown ingredients. Buttered syrups. Full-fat ice cream or ice cream drinks. Fats and Oils Gravy that has suet, meat fat, or shortening. Cocoa butter, hydrogenated oils, palm oil, coconut oil, palm kernel oil. These can often be found in baked products, candy, fried foods, nondairy creamers, and whipped toppings. Solid fats and shortenings, including bacon fat, salt pork, lard, and butter. Nondairy cream substitutes, such as coffee creamers and sour cream substitutes. Salad dressings that are made of unknown oils, cheese, or sour cream. The items listed above may not be a complete list of foods and beverages to avoid. Contact your dietitian for more information.   This information is not intended to replace advice given to you by  your health care provider. Make sure you discuss any questions you have with your health care provider.   Document Released: 10/04/2007 Document Revised: 01/15/2014 Document Reviewed: 06/18/2013 Elsevier Interactive Patient Education [employer]

## 2015-11-22 ENCOUNTER — Encounter: Payer: BLUE CROSS/BLUE SHIELD | Admitting: Nurse Practitioner

## 2015-11-22 LAB — MICROALBUMIN, URINE: Microalb, Ur: <0.2 mg/dL

## 2015-12-19 ENCOUNTER — Encounter: Payer: Self-pay | Admitting: Nurse Practitioner

## 2015-12-19 ENCOUNTER — Ambulatory Visit (INDEPENDENT_AMBULATORY_CARE_PROVIDER_SITE_OTHER): Payer: BLUE CROSS/BLUE SHIELD | Admitting: Nurse Practitioner

## 2015-12-19 VITALS — BP 114/70 | HR 67 | Temp 97.9°F | Resp 12 | Ht 69.0 in | Wt 223.0 lb

## 2015-12-19 DIAGNOSIS — F32A Depression, unspecified: Secondary | ICD-10-CM

## 2015-12-19 DIAGNOSIS — F329 Major depressive disorder, single episode, unspecified: Secondary | ICD-10-CM

## 2015-12-19 DIAGNOSIS — F418 Other specified anxiety disorders: Secondary | ICD-10-CM

## 2015-12-19 DIAGNOSIS — F17200 Nicotine dependence, unspecified, uncomplicated: Secondary | ICD-10-CM

## 2015-12-19 DIAGNOSIS — F419 Anxiety disorder, unspecified: Secondary | ICD-10-CM

## 2015-12-19 MED ORDER — BUPROPION HCL ER (XL) 150 MG PO TB24: 150.0000 mg | ORAL_TABLET | Freq: Every day | ORAL | 3 refills | Status: DC

## 2015-12-19 NOTE — Progress Notes (Signed)
Careteam: Patient Care Team: Kermit Baloiffany L [name], DO as PCP - General (Geriatric Medicine)  No Known Allergies  Chief Complaint  Patient presents with  . Medical Management of Chronic Issues    4 week follow-up, stopped smoking x 5 days, relasped last Friday now smoking about 4 cig daily. Patient states cig do not taste good and she does not know why she is smoking.      HPI: Patient is a 60 y.o. female seen in the office today to follow up on Wellbutrin start for smoking cessation. Has been on Wellbutrin 150 mg for 4 weeks, did not start 2 tablets. Had stopped smoking for 5 days but husband asked her to buy him cigarettes and then she had one and now shes been smoking 4 a day. Still very motivate dto quit. cigarettes taste bad. Even had her car detailed to prevent it.  Feels like when she has gum or a cough drop the urge goes away.  irritable around 5 pm, feels like she needs a treat once she done with work.  No worsening anxiety or depression.    Review of Systems:  Review of Systems  Constitutional: Negative for activity change, appetite change, fatigue and unexpected weight change.  Eyes: Negative.   Respiratory: Negative for shortness of breath.   Cardiovascular: Negative for chest pain and palpitations.  Skin: Negative for color change and wound.  Neurological: Negative for dizziness, weakness, light-headedness and headaches.  Psychiatric/Behavioral: Positive for agitation (after work). Negative for behavioral problems, confusion, decreased concentration, dysphoric mood, self-injury, sleep disturbance and suicidal ideas. The patient is not nervous/anxious and is not hyperactive.     Past Medical History:  Diagnosis Date  . Anemia   . Chronic back pain   . Hypothyroidism    Past Surgical History:  Procedure Laterality Date  . CESAREAN SECTION  [id number],1991  . COLONOSCOPY N/A 08/07/2013   Procedure: COLONOSCOPY;  Surgeon: West BaliSandi L Fields, MD;  [facility];   Service: Endoscopy;  Laterality: N/A;  10:30-moved to [address] notified pt  . ESOPHAGOGASTRODUODENOSCOPY    . ESOPHAGOGASTRODUODENOSCOPY N/A 08/07/2013   Procedure: ESOPHAGOGASTRODUODENOSCOPY (EGD);  Surgeon: West BaliSandi L Fields, MD;  [facility];  Service: Endoscopy;  Laterality: N/A;  . GASTRIC BYPASS  [address]2004   [place], [place]  . TONSILLECTOMY     Childhood  . UPPER GI ENDOSCOPY  2004   Dr.Borhanian    Social History:   reports that she has been smoking Cigarettes.  She has a 5.00 pack-year smoking history. She has never used smokeless tobacco. She reports that she does not drink alcohol or use drugs.  Family History  Problem Relation Age of Onset  . Heart disease Mother   . Diabetes Mother   . Stroke Mother   . Hypertension Mother   . Diabetes Father   . Heart disease Father   . Hypertension Father   . Migraines Daughter   . Colon cancer Neg Hx     Medications: Patient's Medications  New Prescriptions   No medications on file  Previous Medications   ACETAMINOPHEN (TYLENOL) 500 MG TABLET    Take 500 mg by mouth every 6 (six) hours as needed for mild pain or headache.   BUPROPION (WELLBUTRIN XL) 150 MG 24 HR TABLET    Take 1 tablet (150 mg total) by mouth daily.   CYANOCOBALAMIN (,VITAMIN B-12,) 1000 MCG/ML INJECTION    Inject 1 mL (1,000 mcg total) into the muscle every  30 (thirty) days.   ERGOCALCIFEROL (VITAMIN D2) 50000 UNITS CAPSULE    Take 1 capsule (50,000 Units total) by mouth once a week.   ESCITALOPRAM (LEXAPRO) 10 MG TABLET    TAKE ONE TABLET BY MOUTH ONCE DAILY.   LEVOTHYROXINE (SYNTHROID, LEVOTHROID) 50 MCG TABLET    TAKE ONE TABLET BY MOUTH ONCE DAILY.   VITAMIN B-12 (CYANOCOBALAMIN) 100 MCG TABLET    Take 1 tablet (100 mcg total) by mouth daily. Uses sublinquil   ZONISAMIDE (ZONEGRAN) 25 MG CAPSULE    Take 100 mg by mouth at bedtime.  Modified Medications   No medications on file  Discontinued Medications   No medications on file     Physical  Exam:  Vitals:   12/19/15 0842  BP: 114/70  Pulse: 67  Resp: 12  Temp: 97.9 F (36.6 C)  TempSrc: Oral  SpO2: 97%  Weight: 223 lb (101.2 kg)  Height: 5\' 9"  (1.753 m)   Body mass index is 32.93 kg/m.  Physical Exam  Constitutional: She is oriented to person, place, and time. She appears well-developed and well-nourished.  Cardiovascular: Normal rate, regular rhythm and normal heart sounds.   Pulmonary/Chest: Effort normal and breath sounds normal.  Neurological: She is alert and oriented to person, place, and time.  Skin: Skin is warm and dry.  Psychiatric: She has a normal mood and affect.    Labs reviewed: Basic Metabolic Panel:  Recent Labs  54/09/8101/03/17 1341 09/09/15 1247  NA 141 142  K 4.1 3.7  CL 107 109  CO2 27 28  GLUCOSE 111* 148*  BUN 15 13  CREATININE 0.73 0.95  CALCIUM 8.7* 8.5*   Liver Function Tests:  Recent Labs  03/11/15 1341 09/09/15 1247  AST 18 18  ALT 16 17  ALKPHOS 98 81  BILITOT 0.4 0.4  PROT 6.6 6.0*  ALBUMIN 3.7 3.4*   No results for input(s): LIPASE, AMYLASE in the last 8760 hours. No results for input(s): AMMONIA in the last 8760 hours. CBC:  Recent Labs  03/11/15 1341 09/09/15 1247  WBC 5.6 6.1  NEUTROABS 3.3 4.1  HGB 12.8 12.0  HCT 38.3 36.4  MCV 98.5 98.6  PLT 345 316   Lipid Panel:  Recent Labs  11/18/15 1001  CHOL 181  HDL 53  LDLCALC 108*  TRIG 101  CHOLHDL 3.4   TSH: No results for input(s): TSH in the last 8760 hours. A1C: Lab Results  Component Value Date   HGBA1C 5.2 11/18/2015     Assessment/Plan 1. Smoker -quit for 5 days but relapsed, feel like she will be able to quit again -will try to stay on 150 mg Wellbutrin but may increase to 300 mg if unable to tolerate afternoon agitation, also encouraged to change up routine, gum and increase activity.  To notify office increased - buPROPion (WELLBUTRIN XL) 150 MG 24 hr tablet; Take 1 tablet (150 mg total) by mouth daily.  Dispense: 30 tablet;  Refill: 3  2. Anxiety and depression -stable at this time. conts on celexa and Wellbutrin to help mood but also for smoking cessation.  - buPROPion (WELLBUTRIN XL) 150 MG 24 hr tablet; Take 1 tablet (150 mg total) by mouth daily.  Dispense: 30 tablet; Refill: 3  Follow up in 3 months, sooner if needed  Haward Pope K. Biagio BorgEubanks, AGNP  [facility] [phone](Monday-Friday 8 am - 5 pm) [phone] (after hours)

## 2015-12-19 NOTE — Patient Instructions (Signed)
Increase physical activity Get gum in your car so you can chew that instead of smoke.  Changing routine helps.

## 2016-02-21 ENCOUNTER — Other Ambulatory Visit: Payer: Self-pay | Admitting: Internal Medicine

## 2016-02-21 DIAGNOSIS — Z1231 Encounter for screening mammogram for malignant neoplasm of breast: Secondary | ICD-10-CM

## 2016-02-27 ENCOUNTER — Ambulatory Visit (HOSPITAL_COMMUNITY): Payer: BLUE CROSS/BLUE SHIELD

## 2016-03-09 ENCOUNTER — Encounter (HOSPITAL_COMMUNITY): Payer: Self-pay

## 2016-03-09 ENCOUNTER — Encounter (HOSPITAL_COMMUNITY): Payer: BLUE CROSS/BLUE SHIELD | Attending: Oncology | Admitting: Oncology

## 2016-03-09 ENCOUNTER — Encounter (HOSPITAL_COMMUNITY): Payer: BLUE CROSS/BLUE SHIELD

## 2016-03-09 DIAGNOSIS — K912 Postsurgical malabsorption, not elsewhere classified: Secondary | ICD-10-CM | POA: Diagnosis not present

## 2016-03-09 DIAGNOSIS — D509 Iron deficiency anemia, unspecified: Secondary | ICD-10-CM

## 2016-03-09 DIAGNOSIS — E559 Vitamin D deficiency, unspecified: Secondary | ICD-10-CM

## 2016-03-09 DIAGNOSIS — D508 Other iron deficiency anemias: Secondary | ICD-10-CM | POA: Diagnosis not present

## 2016-03-09 DIAGNOSIS — K909 Intestinal malabsorption, unspecified: Secondary | ICD-10-CM

## 2016-03-09 DIAGNOSIS — Z9884 Bariatric surgery status: Secondary | ICD-10-CM

## 2016-03-09 DIAGNOSIS — E538 Deficiency of other specified B group vitamins: Secondary | ICD-10-CM | POA: Diagnosis not present

## 2016-03-09 DIAGNOSIS — K9089 Other intestinal malabsorption: Secondary | ICD-10-CM

## 2016-03-09 LAB — CBC WITH DIFFERENTIAL/PLATELET
Basophils Absolute: 0 10*3/uL (ref 0.0–0.1)
Basophils Relative: 0 %
EOS ABS: 0.1 10*3/uL (ref 0.0–0.7)
EOS PCT: 1 %
HCT: 36.1 % (ref 36.0–46.0)
HEMOGLOBIN: 12 g/dL (ref 12.0–15.0)
LYMPHS PCT: 37 %
Lymphs Abs: 1.5 10*3/uL (ref 0.7–4.0)
MCH: 32.9 pg (ref 26.0–34.0)
MCHC: 33.2 g/dL (ref 30.0–36.0)
MCV: 98.9 fL (ref 78.0–100.0)
Monocytes Absolute: 0.3 10*3/uL (ref 0.1–1.0)
Monocytes Relative: 7 %
NEUTROS PCT: 55 %
Neutro Abs: 2.3 10*3/uL (ref 1.7–7.7)
PLATELETS: 250 10*3/uL (ref 150–400)
RBC: 3.65 MIL/uL — AB (ref 3.87–5.11)
RDW: 12.5 % (ref 11.5–15.5)
WBC: 4.2 10*3/uL (ref 4.0–10.5)

## 2016-03-09 LAB — IRON AND TIBC
IRON: 87 ug/dL (ref 28–170)
Saturation Ratios: 33 % — ABNORMAL HIGH (ref 10.4–31.8)
TIBC: 266 ug/dL (ref 250–450)
UIBC: 179 ug/dL

## 2016-03-09 LAB — VITAMIN B12: VITAMIN B 12: 419 pg/mL (ref 180–914)

## 2016-03-09 LAB — FERRITIN: Ferritin: 80 ng/mL (ref 11–307)

## 2016-03-09 MED ORDER — CYANOCOBALAMIN 1000 MCG/ML IJ SOLN: 1000.0000 ug | INTRAMUSCULAR | 11 refills | Status: DC

## 2016-03-09 MED ORDER — ERGOCALCIFEROL 1.25 MG (50000 UT) PO CAPS: 50000.0000 [IU] | ORAL_CAPSULE | ORAL | 5 refills | Status: DC

## 2016-03-09 NOTE — Progress Notes (Signed)
April, [name], DO [address] [place] [address]    DIAGNOSIS:  Iron deficiency anemia secondary to malabsorption History of gastric bypass B12 deficiency, on B12 IM Hypocalcemia Colonoscopy on 08/07/2013 with a large redundant colon and internal/external hemorrhoids EGD on 08/07/2013 with a Schatzki ring at the GE junction and small clean-based ulcer at anastomosis  CURRENT THERAPY: IV iron   INTERVAL HISTORY: April [name] 61 y.o. female returns for iron deficiency anemia. She has a history of gastric bypass.   The patient is here for follow up. She states she's been doing well. She does have some fatigue. No pica symptoms. Compliant with taking her vitamin D and self injects vitamin B12.   MEDICAL HISTORY: Past Medical History:  Diagnosis Date  . Anemia   . Chronic back pain   . Hypothyroidism     has Postoperative malabsorption; Encounter for screening colonoscopy; Anemia; Malabsorption of iron; and H/O gastric bypass on her problem list.     has No Known Allergies.  [name] does not currently have medications on file.  SURGICAL HISTORY: Past Surgical History:  Procedure Laterality Date  . CESAREAN SECTION  [id number]  . COLONOSCOPY N/A 08/07/2013   Procedure: COLONOSCOPY;  Surgeon: West Bali, MD;  [facility];  Service: Endoscopy;  Laterality: N/A;  10:30-moved to [address] notified pt  . ESOPHAGOGASTRODUODENOSCOPY    . ESOPHAGOGASTRODUODENOSCOPY N/A 08/07/2013   Procedure: ESOPHAGOGASTRODUODENOSCOPY (EGD);  Surgeon: West Bali, MD;  [facility];  Service: Endoscopy;  Laterality: N/A;  . GASTRIC BYPASS  [address] [place]  . TONSILLECTOMY     Childhood  . UPPER GI ENDOSCOPY  2004   Dr.Borhanian     SOCIAL HISTORY: Social History   Social History  . Marital status: Married    Spouse name: N/A  . Number of children: N/A  . Years of education: N/A   Occupational History  . Dentist [employer]     Social History Main Topics  . Smoking status: Current Every Day Smoker    Packs/day: 0.50    Years: 10.00    Types: Cigarettes  . Smokeless tobacco: Never Used     Comment: Relasped, 4 cig daily   . Alcohol use No  . Drug use: No  . Sexual activity: Yes   Other Topics Concern  . Not on file   Social History Narrative   Married, 1990, has 2 children. Patient lives in a multi-level home. Previously smoked 1/2 PPD, smoked for 10-20 years. Drinks a minimal amount of caffeinated beverages.     FAMILY HISTORY: Family History  Problem Relation Age of Onset  . Heart disease Mother   . Diabetes Mother   . Stroke Mother   . Hypertension Mother   . Diabetes Father   . Heart disease Father   . Hypertension Father   . Migraines Daughter   . Colon cancer Neg Hx     Review of Systems  Constitutional: Negative.   HENT: Negative.   Eyes: Negative.   Respiratory: Negative.   Cardiovascular: Negative.   Gastrointestinal: Negative.   Genitourinary: Negative.   Musculoskeletal: Negative.   Skin: Negative.   Neurological: Negative.   Endo/Heme/Allergies: Negative.   Psychiatric/Behavioral: Negative.   All other systems reviewed and are negative.  14 point review of systems was performed and is negative except as detailed under history of present illness and above   PHYSICAL EXAMINATION  ECOG PERFORMANCE  STATUS: 0 - Asymptomatic  Vitals:   03/09/16 1410  BP: 124/67  Pulse: (!) 59  Resp: 18  Temp: 97.8 F (36.6 C)    Physical Exam  Constitutional: She is oriented to person, place, and time and well-developed, well-nourished, and in no distress.  HENT:  Head: Normocephalic and atraumatic.  Mouth/Throat: Oropharynx is clear and moist.  Eyes: Conjunctivae and EOM are normal. Pupils are equal, round, and reactive to light.  Neck: Normal range of motion. Neck supple.  Cardiovascular: Normal rate, regular rhythm and normal heart sounds.   Pulmonary/Chest: Effort normal  and breath sounds normal.  Abdominal: Soft. Bowel sounds are normal.  Musculoskeletal: Normal range of motion.  Neurological: She is alert and oriented to person, place, and time. Gait normal.  Skin: Skin is warm and dry.  Nursing note and vitals reviewed.   LABORATORY DATA: I have reviewed the data as listed.  CBC    Component Value Date/Time   WBC 4.2 03/09/2016 1335   RBC 3.65 (L) 03/09/2016 1335   HGB 12.0 03/09/2016 1335   HCT 36.1 03/09/2016 1335   PLT 250 03/09/2016 1335   MCV 98.9 03/09/2016 1335   MCH 32.9 03/09/2016 1335   MCHC 33.2 03/09/2016 1335   RDW 12.5 03/09/2016 1335   LYMPHSABS 1.5 03/09/2016 1335   MONOABS 0.3 03/09/2016 1335   EOSABS 0.1 03/09/2016 1335   BASOSABS 0.0 03/09/2016 1335   CMP     Component Value Date/Time   NA 142 09/09/2015 1247   K 3.7 09/09/2015 1247   CL 109 09/09/2015 1247   CO2 28 09/09/2015 1247   GLUCOSE 148 (H) 09/09/2015 1247   BUN 13 09/09/2015 1247   CREATININE 0.95 09/09/2015 1247   CALCIUM 8.5 (L) 09/09/2015 1247   PROT 6.0 (L) 09/09/2015 1247   ALBUMIN 3.4 (L) 09/09/2015 1247   AST 18 09/09/2015 1247   ALT 17 09/09/2015 1247   ALKPHOS 81 09/09/2015 1247   BILITOT 0.4 09/09/2015 1247   GFRNONAA >60 09/09/2015 1247   GFRAA >60 09/09/2015 1247     ASSESSMENT and THERAPY PLAN:  Gastric Bypass Iron deficiency B12 deficency Hypocalcemia Colonoscopy on 08/07/2013 with a large redundant colon and internal/external hemorrhoids EGD on 08/07/2013 with a Schatzki ring at the GE junction and small clean-based ulcer at anastomosis Vitamin D deficiency  Patient doing well. She has not been anemic. Labs pending from today. I will call patient if there's any concerning labs.  Refilled vitamin D and vitamin B12 injections.  RTC in 6 months with CBC, CMP, iron studies, vitamin B12, vitamin D level.  No orders of the defined types were placed in this encounter.  All questions were answered. The patient knows to call the  clinic with any problems, questions or concerns. We can certainly see the patient much sooner if necessary.  This document serves as a record of services personally performed by Ralene CorkLouise Javona Bergevin, MD. It was created on her behalf by Delana MeyerElizabeth Ashley, a trained medical scribe. The creation of this record is based on the scribe's personal observations and the provider's statements to them. This document has been checked and approved by the attending provider.  I have reviewed the above documentation for accuracy and completeness and I agree with the above.  This note was electronically signed. Ralene CorkLouise Tyeisha Dinan, MD  03/09/2016

## 2016-03-09 NOTE — Patient Instructions (Signed)
[facility] Discharge Instructions  RECOMMENDATIONS MADE BY THE CONSULTANT AND ANY TEST RESULTS WILL BE SENT TO YOUR REFERRING PHYSICIAN.  You were seen today by Dr. Louise Zhou Follow up in 6 months with lab work See Amy up front for appointments   Thank you for choosing [facility] to provide your oncology and hematology care.  To afford each patient quality time with our provider, please arrive at least 15 minutes before your scheduled appointment time.    If you have a lab appointment with the Cancer Center please come in thru the  Main Entrance and check in at the main information desk  You need to re-schedule your appointment should you arrive 10 or more minutes late.  We strive to give you quality time with our providers, and arriving late affects you and other patients whose appointments are after yours.  Also, if you no show three or more times for appointments you may be dismissed from the clinic at the providers discretion.     Again, thank you for choosing [facility].  Our hope is that these requests will decrease the amount of time that you wait before being seen by our physicians.       _____________________________________________________________  Should you have questions after your visit to  Cancer Center, please contact our office at [phone] between the hours of 8:30 a.m. and 4:30 p.m.  Voicemails left after 4:30 p.m. will not be returned until the following business day.  For prescription refill requests, have your pharmacy contact our office.       Resources For Cancer Patients and their Caregivers ? American Cancer Society: Can assist with transportation, wigs, general needs, runs Look Good Feel Better.        1-888-227-6333 ? Cancer Care: Provides financial assistance, online support groups, medication/co-pay assistance.  1-800-813-HOPE (4673) ? [facility] Assists [place] cancer patients and their families through emotional , educational and financial support.  336-427-4357 ? [place] DSS Where to apply for food stamps, Medicaid and utility assistance. 336-342-1394 ? [facility]: Transportation to medical appointments. 336-347-2287 ? Social Security Administration: May apply for disability if have a Stage IV cancer. 336-342-7796 1-800-772-1213 ? [place] Aging, Disability and Transit Services: Assists with nutrition, care and transit needs. 336-349-2343  Cancer Center Support Programs: @10RELATIVEDAYS@ > Cancer Support Group  2nd Tuesday of the month 1pm-2pm, Journey Room  > Creative Journey  3rd Tuesday of the month 1130am-1pm, Journey Room  > Look Good Feel Better  1st Wednesday of the month 10am-12 noon, Journey Room (Call American Cancer Society to register 1-800-395-5775)    

## 2016-03-10 LAB — VITAMIN D 25 HYDROXY (VIT D DEFICIENCY, FRACTURES): VIT D 25 HYDROXY: 31.5 ng/mL (ref 30.0–100.0)

## 2016-03-29 ENCOUNTER — Ambulatory Visit: Payer: BLUE CROSS/BLUE SHIELD | Admitting: Nurse Practitioner

## 2016-03-30 ENCOUNTER — Ambulatory Visit (HOSPITAL_COMMUNITY)
Admission: RE | Admit: 2016-03-30 | Discharge: 2016-03-30 | Disposition: A | Discharge status: Home / Self Care | Payer: BLUE CROSS/BLUE SHIELD | Source: Ambulatory Visit | Attending: Internal Medicine | Admitting: Internal Medicine

## 2016-03-30 DIAGNOSIS — Z1231 Encounter for screening mammogram for malignant neoplasm of breast: Secondary | ICD-10-CM

## 2016-04-04 DIAGNOSIS — G629 Polyneuropathy, unspecified: Secondary | ICD-10-CM | POA: Diagnosis not present

## 2016-04-26 DIAGNOSIS — M9902 Segmental and somatic dysfunction of thoracic region: Secondary | ICD-10-CM | POA: Diagnosis not present

## 2016-04-26 DIAGNOSIS — M9905 Segmental and somatic dysfunction of pelvic region: Secondary | ICD-10-CM | POA: Diagnosis not present

## 2016-04-26 DIAGNOSIS — M545 Low back pain: Secondary | ICD-10-CM | POA: Diagnosis not present

## 2016-04-26 DIAGNOSIS — M9903 Segmental and somatic dysfunction of lumbar region: Secondary | ICD-10-CM | POA: Diagnosis not present

## 2016-04-27 DIAGNOSIS — G43719 Chronic migraine without aura, intractable, without status migrainosus: Secondary | ICD-10-CM | POA: Diagnosis not present

## 2016-04-27 DIAGNOSIS — G43111 Migraine with aura, intractable, with status migrainosus: Secondary | ICD-10-CM | POA: Diagnosis not present

## 2016-05-05 ENCOUNTER — Other Ambulatory Visit (HOSPITAL_COMMUNITY): Payer: Self-pay | Admitting: Hematology & Oncology

## 2016-05-05 DIAGNOSIS — K909 Intestinal malabsorption, unspecified: Secondary | ICD-10-CM

## 2016-05-05 DIAGNOSIS — D508 Other iron deficiency anemias: Secondary | ICD-10-CM

## 2016-05-05 DIAGNOSIS — K912 Postsurgical malabsorption, not elsewhere classified: Secondary | ICD-10-CM

## 2016-05-05 DIAGNOSIS — Z9884 Bariatric surgery status: Secondary | ICD-10-CM

## 2016-05-07 ENCOUNTER — Other Ambulatory Visit: Payer: Self-pay | Admitting: *Deleted

## 2016-05-07 MED ORDER — ESCITALOPRAM OXALATE 10 MG PO TABS: 10.0000 mg | ORAL_TABLET | Freq: Every day | ORAL | 0 refills | Status: DC

## 2016-05-07 MED ORDER — LEVOTHYROXINE SODIUM 50 MCG PO TABS: 50.0000 ug | ORAL_TABLET | Freq: Every day | ORAL | 0 refills | Status: DC

## 2016-05-07 NOTE — Telephone Encounter (Signed)
[facility] 

## 2016-06-12 ENCOUNTER — Other Ambulatory Visit: Payer: Self-pay | Admitting: Nurse Practitioner

## 2016-06-12 DIAGNOSIS — F419 Anxiety disorder, unspecified: Secondary | ICD-10-CM

## 2016-06-12 DIAGNOSIS — F32A Depression, unspecified: Secondary | ICD-10-CM

## 2016-06-12 DIAGNOSIS — F17200 Nicotine dependence, unspecified, uncomplicated: Secondary | ICD-10-CM

## 2016-06-12 DIAGNOSIS — F329 Major depressive disorder, single episode, unspecified: Secondary | ICD-10-CM

## 2016-08-17 DIAGNOSIS — M542 Cervicalgia: Secondary | ICD-10-CM | POA: Diagnosis not present

## 2016-08-17 DIAGNOSIS — M791 Myalgia: Secondary | ICD-10-CM | POA: Diagnosis not present

## 2016-08-17 DIAGNOSIS — G43719 Chronic migraine without aura, intractable, without status migrainosus: Secondary | ICD-10-CM | POA: Diagnosis not present

## 2016-08-17 DIAGNOSIS — G43111 Migraine with aura, intractable, with status migrainosus: Secondary | ICD-10-CM | POA: Diagnosis not present

## 2016-08-17 DIAGNOSIS — G518 Other disorders of facial nerve: Secondary | ICD-10-CM | POA: Diagnosis not present

## 2016-09-14 ENCOUNTER — Encounter (HOSPITAL_COMMUNITY): Payer: Self-pay | Admitting: Oncology

## 2016-09-14 ENCOUNTER — Other Ambulatory Visit (HOSPITAL_COMMUNITY): Payer: Self-pay | Admitting: Oncology

## 2016-09-14 ENCOUNTER — Encounter (HOSPITAL_COMMUNITY): Payer: BLUE CROSS/BLUE SHIELD | Attending: Oncology | Admitting: Oncology

## 2016-09-14 ENCOUNTER — Encounter (HOSPITAL_COMMUNITY): Payer: BLUE CROSS/BLUE SHIELD

## 2016-09-14 VITALS — BP 127/74 | HR 65 | Temp 98.0°F | Resp 18 | Wt 219.0 lb

## 2016-09-14 DIAGNOSIS — F17210 Nicotine dependence, cigarettes, uncomplicated: Secondary | ICD-10-CM | POA: Insufficient documentation

## 2016-09-14 DIAGNOSIS — E538 Deficiency of other specified B group vitamins: Secondary | ICD-10-CM | POA: Diagnosis not present

## 2016-09-14 DIAGNOSIS — D509 Iron deficiency anemia, unspecified: Secondary | ICD-10-CM | POA: Diagnosis not present

## 2016-09-14 DIAGNOSIS — G8929 Other chronic pain: Secondary | ICD-10-CM | POA: Diagnosis not present

## 2016-09-14 DIAGNOSIS — Z9884 Bariatric surgery status: Secondary | ICD-10-CM | POA: Diagnosis not present

## 2016-09-14 DIAGNOSIS — M549 Dorsalgia, unspecified: Secondary | ICD-10-CM | POA: Insufficient documentation

## 2016-09-14 DIAGNOSIS — K909 Intestinal malabsorption, unspecified: Secondary | ICD-10-CM

## 2016-09-14 DIAGNOSIS — E039 Hypothyroidism, unspecified: Secondary | ICD-10-CM | POA: Insufficient documentation

## 2016-09-14 DIAGNOSIS — D508 Other iron deficiency anemias: Secondary | ICD-10-CM

## 2016-09-14 DIAGNOSIS — K912 Postsurgical malabsorption, not elsewhere classified: Secondary | ICD-10-CM

## 2016-09-14 LAB — CBC WITH DIFFERENTIAL/PLATELET
BASOS PCT: 0 %
Basophils Absolute: 0 10*3/uL (ref 0.0–0.1)
Eosinophils Absolute: 0.1 10*3/uL (ref 0.0–0.7)
Eosinophils Relative: 2 %
HCT: 35 % — ABNORMAL LOW (ref 36.0–46.0)
HEMOGLOBIN: 11.6 g/dL — AB (ref 12.0–15.0)
Lymphocytes Relative: 35 %
Lymphs Abs: 1.4 10*3/uL (ref 0.7–4.0)
MCH: 32.7 pg (ref 26.0–34.0)
MCHC: 33.1 g/dL (ref 30.0–36.0)
MCV: 98.6 fL (ref 78.0–100.0)
Monocytes Absolute: 0.2 10*3/uL (ref 0.1–1.0)
Monocytes Relative: 6 %
NEUTROS PCT: 57 %
Neutro Abs: 2.2 10*3/uL (ref 1.7–7.7)
Platelets: 286 10*3/uL (ref 150–400)
RBC: 3.55 MIL/uL — AB (ref 3.87–5.11)
RDW: 12.9 % (ref 11.5–15.5)
WBC: 3.9 10*3/uL — AB (ref 4.0–10.5)

## 2016-09-14 LAB — COMPREHENSIVE METABOLIC PANEL
ALT: 16 U/L (ref 14–54)
AST: 18 U/L (ref 15–41)
Albumin: 3.4 g/dL — ABNORMAL LOW (ref 3.5–5.0)
Alkaline Phosphatase: 88 U/L (ref 38–126)
Anion gap: 4 — ABNORMAL LOW (ref 5–15)
BUN: 12 mg/dL (ref 6–20)
CHLORIDE: 112 mmol/L — AB (ref 101–111)
CO2: 23 mmol/L (ref 22–32)
Calcium: 8.5 mg/dL — ABNORMAL LOW (ref 8.9–10.3)
Creatinine, Ser: 0.94 mg/dL (ref 0.44–1.00)
Glucose, Bld: 234 mg/dL — ABNORMAL HIGH (ref 65–99)
POTASSIUM: 3.9 mmol/L (ref 3.5–5.1)
SODIUM: 139 mmol/L (ref 135–145)
Total Bilirubin: 0.4 mg/dL (ref 0.3–1.2)
Total Protein: 6 g/dL — ABNORMAL LOW (ref 6.5–8.1)

## 2016-09-14 LAB — IRON AND TIBC
IRON: 71 ug/dL (ref 28–170)
SATURATION RATIOS: 27 % (ref 10.4–31.8)
TIBC: 267 ug/dL (ref 250–450)
UIBC: 196 ug/dL

## 2016-09-14 LAB — FERRITIN: FERRITIN: 57 ng/mL (ref 11–307)

## 2016-09-14 LAB — VITAMIN B12: VITAMIN B 12: 511 pg/mL (ref 180–914)

## 2016-09-14 NOTE — Patient Instructions (Signed)
[facility] at [facility] Discharge Instructions  RECOMMENDATIONS MADE BY THE CONSULTANT AND ANY TEST RESULTS WILL BE SENT TO YOUR REFERRING PHYSICIAN.  You saw Dr. Zhou today.  Thank you for choosing [facility] at [facility] to provide your oncology and hematology care.  To afford each patient quality time with our provider, please arrive at least 15 minutes before your scheduled appointment time.    If you have a lab appointment with the Cancer Center please come in thru the  Main Entrance and check in at the main information desk  You need to re-schedule your appointment should you arrive 10 or more minutes late.  We strive to give you quality time with our providers, and arriving late affects you and other patients whose appointments are after yours.  Also, if you no show three or more times for appointments you may be dismissed from the clinic at the providers discretion.     Again, thank you for choosing [facility].  Our hope is that these requests will decrease the amount of time that you wait before being seen by our physicians.       _____________________________________________________________  Should you have questions after your visit to [facility], please contact our office at [phone] between the hours of 8:30 a.m. and 4:30 p.m.  Voicemails left after 4:30 p.m. will not be returned until the following business day.  For prescription refill requests, have your pharmacy contact our office.       Resources For Cancer Patients and their Caregivers ? American Cancer Society: Can assist with transportation, wigs, general needs, runs Look Good Feel Better.        1-888-227-6333 ? Cancer Care: Provides financial assistance, online support groups, medication/co-pay assistance.  1-800-813-HOPE (4673) ? [facility] Assists [place] cancer patients and their families through  emotional , educational and financial support.  336-427-4357 ? [place] DSS Where to apply for food stamps, Medicaid and utility assistance. 336-342-1394 ? RCATS: Transportation to medical appointments. 336-347-2287 ? Social Security Administration: May apply for disability if have a Stage IV cancer. 336-342-7796 1-800-772-1213 ? [place] Aging, Disability and Transit Services: Assists with nutrition, care and transit needs. 336-349-2343  Cancer Center Support Programs: [email or web address]@ > Cancer Support Group  2nd Tuesday of the month 1pm-2pm, Journey Room  > Creative Journey  3rd Tuesday of the month 1130am-1pm, Journey Room  > Look Good Feel Better  1st Wednesday of the month 10am-12 noon, Journey Room (Call American Cancer Society to register 1-800-395-5775)    

## 2016-09-14 NOTE — Progress Notes (Signed)
[name], [name], DO [address] [place] [address]    DIAGNOSIS:  Iron deficiency anemia secondary to malabsorption History of gastric bypass B12 deficiency, on B12 IM Hypocalcemia Colonoscopy on 08/07/2013 with a large redundant colon and internal/external hemorrhoids EGD on 08/07/2013 with a Schatzki ring at the GE junction and small clean-based ulcer at anastomosis  CURRENT THERAPY: IV iron   INTERVAL HISTORY: April [name] 61 y.o. female returns for iron deficiency anemia. She has a history of gastric bypass.   The patient is here for follow up. She states she's been doing well. Since her last visit she has been to [place] on vacation. She denies any fatigue, chest pain, shortness of breath, abdominal pain, focal weakness.  Compliant with taking her vitamin D and self injects vitamin B12.   MEDICAL HISTORY: Past Medical History:  Diagnosis Date  . Anemia   . Chronic back pain   . Hypothyroidism     has Postoperative malabsorption; Encounter for screening colonoscopy; Anemia; Malabsorption of iron; and H/O gastric bypass on her problem list.     has No Known Allergies.  [name] had no medications administered during this visit.  SURGICAL HISTORY: Past Surgical History:  Procedure Laterality Date  . CESAREAN SECTION  [id number]  . COLONOSCOPY N/A 08/07/2013   Procedure: COLONOSCOPY;  Surgeon: West Bali, MD;  [facility];  Service: Endoscopy;  Laterality: N/A;  10:30-moved to [address] notified pt  . ESOPHAGOGASTRODUODENOSCOPY    . ESOPHAGOGASTRODUODENOSCOPY N/A 08/07/2013   Procedure: ESOPHAGOGASTRODUODENOSCOPY (EGD);  Surgeon: West Bali, MD;  [facility];  Service: Endoscopy;  Laterality: N/A;  . GASTRIC BYPASS  [address] [place]  . TONSILLECTOMY     Childhood  . UPPER GI ENDOSCOPY  2004   Dr.Borhanian     SOCIAL HISTORY: Social History   Social History  . Marital status: Married    Spouse name: N/A  .  Number of children: N/A  . Years of education: N/A   Occupational History  . Dentist [employer]   Social History Main Topics  . Smoking status: Current Every Day Smoker    Packs/day: 0.50    Years: 10.00    Types: Cigarettes  . Smokeless tobacco: Never Used     Comment: Relasped, 4 cig daily   . Alcohol use No  . Drug use: No  . Sexual activity: Yes   Other Topics Concern  . Not on file   Social History Narrative   Married, 1990, has 2 children. Patient lives in a multi-level home. Previously smoked 1/2 PPD, smoked for 10-20 years. Drinks a minimal amount of caffeinated beverages.     FAMILY HISTORY: Family History  Problem Relation Age of Onset  . Heart disease Mother   . Diabetes Mother   . Stroke Mother   . Hypertension Mother   . Diabetes Father   . Heart disease Father   . Hypertension Father   . Migraines Daughter   . Colon cancer Neg Hx     Review of Systems  Constitutional: Negative.   HENT: Negative.   Eyes: Negative.   Respiratory: Negative.   Cardiovascular: Negative.   Gastrointestinal: Negative.   Genitourinary: Negative.   Musculoskeletal: Negative.   Skin: Negative.   Neurological: Negative.   Endo/Heme/Allergies: Negative.   Psychiatric/Behavioral: Negative.   All other systems reviewed and are negative.  14 point review of systems was performed and is negative  except as detailed under history of present illness and above   PHYSICAL EXAMINATION  ECOG PERFORMANCE STATUS: 0 - Asymptomatic  Vitals:   09/14/16 1352  BP: 127/74  Pulse: 65  Resp: 18  Temp: 98 F (36.7 C)  SpO2: 99%    Physical Exam  Constitutional: She is oriented to person, place, and time and well-developed, well-nourished, and in no distress.  HENT:  Head: Normocephalic and atraumatic.  Mouth/Throat: Oropharynx is clear and moist.  Eyes: Pupils are equal, round, and reactive to light. Conjunctivae and EOM are normal.  Neck: Normal range of motion. Neck  supple.  Cardiovascular: Normal rate, regular rhythm and normal heart sounds.   Pulmonary/Chest: Effort normal and breath sounds normal.  Abdominal: Soft. Bowel sounds are normal.  Musculoskeletal: Normal range of motion.  Neurological: She is alert and oriented to person, place, and time. Gait normal.  Skin: Skin is warm and dry.  Nursing note and vitals reviewed.   LABORATORY DATA: I have reviewed the data as listed.  CBC    Component Value Date/Time   WBC 3.9 ([name]) 09/14/2016 1312   RBC 3.55 ([name]) 09/14/2016 1312   HGB 11.6 ([name]) 09/14/2016 1312   HCT 35.0 ([name]) 09/14/2016 1312   PLT 286 09/14/2016 1312   MCV 98.6 09/14/2016 1312   MCH 32.7 09/14/2016 1312   MCHC 33.1 09/14/2016 1312   RDW 12.9 09/14/2016 1312   LYMPHSABS 1.4 09/14/2016 1312   MONOABS 0.2 09/14/2016 1312   EOSABS 0.1 09/14/2016 1312   BASOSABS 0.0 09/14/2016 1312   CMP     Component Value Date/Time   NA 139 09/14/2016 1312   K 3.9 09/14/2016 1312   CL 112 (H) 09/14/2016 1312   CO2 23 09/14/2016 1312   GLUCOSE 234 (H) 09/14/2016 1312   BUN 12 09/14/2016 1312   CREATININE 0.94 09/14/2016 1312   CALCIUM 8.5 ([name]) 09/14/2016 1312   PROT 6.0 ([name]) 09/14/2016 1312   ALBUMIN 3.4 ([name]) 09/14/2016 1312   AST 18 09/14/2016 1312   ALT 16 09/14/2016 1312   ALKPHOS 88 09/14/2016 1312   BILITOT 0.4 09/14/2016 1312   GFRNONAA >60 09/14/2016 1312   GFRAA >60 09/14/2016 1312     ASSESSMENT and THERAPY PLAN:  Gastric Bypass Iron deficiency B12 deficency Hypocalcemia Colonoscopy on 08/07/2013 with a large redundant colon and internal/external hemorrhoids EGD on 08/07/2013 with a Schatzki ring at the GE junction and small clean-based ulcer at anastomosis Vitamin D deficiency  Patient doing well. She is a little anemic today with a hemoglobin of 11.6. I have discussed with her that I will follow up her iron studies, if her ferritin is below 100, then I will calculate her iron deficit and plan to give her some IV iron.    Continue montly vitamin B12 injections at home.  RTC in 3 months with CBC, CMP, iron studies, vitamin B12.  Orders Placed This Encounter  Procedures  . CBC with Differential    Standing Status:   Future    Standing Expiration Date:   09/14/2017  . Comprehensive metabolic panel    Standing Status:   Future    Standing Expiration Date:   09/14/2017  . Iron and TIBC    Standing Status:   Future    Standing Expiration Date:   09/14/2017  . Ferritin    Standing Status:   Future    Standing Expiration Date:   09/14/2017  . Vitamin B12    Standing Status:   Future  Standing Expiration Date:   09/14/2017   All questions were answered. The patient knows to call the clinic with any problems, questions or concerns. We can certainly see the patient much sooner if necessary.  This document serves as a record of services personally performed by Ralene CorkLouise Juniper Snyders, MD. It was created on her behalf by Delana MeyerElizabeth Ashley, a trained medical scribe. The creation of this record is based on the scribe's personal observations and the provider's statements to them. This document has been checked and approved by the attending provider.  I have reviewed the above documentation for accuracy and completeness and I agree with the above.  This note was electronically signed. Ralene CorkLouise Khali Perella, MD  09/14/2016

## 2016-09-15 LAB — VITAMIN D 25 HYDROXY (VIT D DEFICIENCY, FRACTURES): Vit D, 25-Hydroxy: 31 ng/mL (ref 30.0–100.0)

## 2016-09-21 ENCOUNTER — Ambulatory Visit (HOSPITAL_COMMUNITY): Payer: BLUE CROSS/BLUE SHIELD

## 2016-09-28 ENCOUNTER — Encounter (HOSPITAL_BASED_OUTPATIENT_CLINIC_OR_DEPARTMENT_OTHER): Payer: BLUE CROSS/BLUE SHIELD

## 2016-09-28 ENCOUNTER — Encounter (HOSPITAL_COMMUNITY): Payer: Self-pay

## 2016-09-28 VITALS — BP 109/56 | HR 61 | Temp 98.1°F | Resp 16

## 2016-09-28 DIAGNOSIS — K909 Intestinal malabsorption, unspecified: Secondary | ICD-10-CM

## 2016-09-28 DIAGNOSIS — Z9884 Bariatric surgery status: Secondary | ICD-10-CM | POA: Diagnosis not present

## 2016-09-28 DIAGNOSIS — D508 Other iron deficiency anemias: Secondary | ICD-10-CM | POA: Diagnosis not present

## 2016-09-28 DIAGNOSIS — K912 Postsurgical malabsorption, not elsewhere classified: Secondary | ICD-10-CM

## 2016-09-28 MED ORDER — SODIUM CHLORIDE 0.9 % IV SOLN
510.0000 mg | Freq: Once | INTRAVENOUS | Status: AC
  Administered 2016-09-28: 510 mg via INTRAVENOUS
  Filled 2016-09-28: qty 17

## 2016-09-28 MED ORDER — SODIUM CHLORIDE 0.9 % IV SOLN
INTRAVENOUS | Status: DC
  Administered 2016-09-28: 14:00:00 via INTRAVENOUS

## 2016-09-28 NOTE — Patient Instructions (Signed)
[facility] at [facility] Discharge Instructions  RECOMMENDATIONS MADE BY THE CONSULTANT AND ANY TEST RESULTS WILL BE SENT TO YOUR REFERRING PHYSICIAN.  Feraheme given today Follow up as scheduled.  Thank you for choosing [facility] at [facility] to provide your oncology and hematology care.  To afford each patient quality time with our provider, please arrive at least 15 minutes before your scheduled appointment time.    If you have a lab appointment with the Cancer Center please come in thru the  Main Entrance and check in at the main information desk  You need to re-schedule your appointment should you arrive 10 or more minutes late.  We strive to give you quality time with our providers, and arriving late affects you and other patients whose appointments are after yours.  Also, if you no show three or more times for appointments you may be dismissed from the clinic at the providers discretion.     Again, thank you for choosing [facility].  Our hope is that these requests will decrease the amount of time that you wait before being seen by our physicians.       _____________________________________________________________  Should you have questions after your visit to [facility], please contact our office at [phone] between the hours of 8:30 a.m. and 4:30 p.m.  Voicemails left after 4:30 p.m. will not be returned until the following business day.  For prescription refill requests, have your pharmacy contact our office.       Resources For Cancer Patients and their Caregivers ? American Cancer Society: Can assist with transportation, wigs, general needs, runs Look Good Feel Better.        1-888-227-6333 ? Cancer Care: Provides financial assistance, online support groups, medication/co-pay assistance.  1-800-813-HOPE (4673) ? [facility] Assists [place] cancer patients and  their families through emotional , educational and financial support.  336-427-4357 ? [place] DSS Where to apply for food stamps, Medicaid and utility assistance. 336-342-1394 ? RCATS: Transportation to medical appointments. 336-347-2287 ? Social Security Administration: May apply for disability if have a Stage IV cancer. 336-342-7796 1-800-772-1213 ? [place] Aging, Disability and Transit Services: Assists with nutrition, care and transit needs. 336-349-2343  Cancer Center Support Programs: @[email or web address]@ > Cancer Support Group  2nd Tuesday of the month 1pm-2pm, Journey Room  > Creative Journey  3rd Tuesday of the month 1130am-1pm, Journey Room  > Look Good Feel Better  1st Wednesday of the month 10am-12 noon, Journey Room (Call American Cancer Society to register 1-800-395-5775)   

## 2016-09-28 NOTE — Progress Notes (Signed)
Treatment given per orders. Patient tolerated it well without problems. Vitals stable and discharged home from clinic ambulatory. Follow up as scheduled.  

## 2016-10-05 ENCOUNTER — Ambulatory Visit (HOSPITAL_COMMUNITY): Payer: BLUE CROSS/BLUE SHIELD

## 2016-10-12 ENCOUNTER — Encounter (HOSPITAL_COMMUNITY): Payer: BLUE CROSS/BLUE SHIELD | Attending: Oncology

## 2016-10-12 ENCOUNTER — Encounter (HOSPITAL_COMMUNITY): Payer: Self-pay

## 2016-10-12 VITALS — BP 117/65 | HR 63 | Temp 97.7°F | Resp 18

## 2016-10-12 DIAGNOSIS — D508 Other iron deficiency anemias: Secondary | ICD-10-CM | POA: Diagnosis not present

## 2016-10-12 DIAGNOSIS — K909 Intestinal malabsorption, unspecified: Secondary | ICD-10-CM | POA: Diagnosis not present

## 2016-10-12 DIAGNOSIS — Z9884 Bariatric surgery status: Secondary | ICD-10-CM

## 2016-10-12 DIAGNOSIS — K912 Postsurgical malabsorption, not elsewhere classified: Secondary | ICD-10-CM

## 2016-10-12 MED ORDER — SODIUM CHLORIDE 0.9 % IV SOLN
INTRAVENOUS | Status: DC
  Administered 2016-10-12: 14:00:00 via INTRAVENOUS

## 2016-10-12 MED ORDER — SODIUM CHLORIDE 0.9 % IV SOLN
510.0000 mg | Freq: Once | INTRAVENOUS | Status: AC
  Administered 2016-10-12: 510 mg via INTRAVENOUS
  Filled 2016-10-12: qty 17

## 2016-10-12 NOTE — Progress Notes (Signed)
Tolerated infusion w/o adverse reaction.  Alert, in no distress.  VSS.  Discharged ambulatory.  

## 2016-10-25 ENCOUNTER — Telehealth: Payer: Self-pay

## 2016-10-25 NOTE — Telephone Encounter (Signed)
Incoming fax received from [facility] requesting refills 1.)  Levothyroxine 50 mcg 1 by mouth daily 2.) Escitalopram 10 mg 1 by mouth daily (NOT ON MEDICATION LIST)  Left message on voicemail for patient to return call when available, reason for call: Patient needs to schedule a follow-up appt and clarify request for Escitalopram   Last TSH 11/26/14, 2.37 Last OV 12/19/15, patient was to follow-up in 3 month (appt was due 03/2016)

## 2016-10-29 MED ORDER — LEVOTHYROXINE SODIUM 50 MCG PO TABS: 50.0000 ug | ORAL_TABLET | Freq: Every day | ORAL | 0 refills | Status: DC

## 2016-10-29 MED ORDER — ESCITALOPRAM OXALATE 10 MG PO TABS: 10.0000 mg | ORAL_TABLET | Freq: Every day | ORAL | 0 refills | Status: DC

## 2016-10-29 NOTE — Telephone Encounter (Signed)
Patient called on 10/29/16 and scheduled appointment for 11/02/16 with April [name] for medication management and refills.   RX for levothyroxine and Escitalopram sent to pharmacy. Escitalopram is on medication list.

## 2016-11-02 ENCOUNTER — Ambulatory Visit (INDEPENDENT_AMBULATORY_CARE_PROVIDER_SITE_OTHER): Payer: BLUE CROSS/BLUE SHIELD | Admitting: Nurse Practitioner

## 2016-11-02 ENCOUNTER — Encounter: Payer: Self-pay | Admitting: Nurse Practitioner

## 2016-11-02 VITALS — BP 114/72 | HR 66 | Temp 98.0°F | Resp 18 | Ht 69.0 in | Wt 215.6 lb

## 2016-11-02 DIAGNOSIS — F325 Major depressive disorder, single episode, in full remission: Secondary | ICD-10-CM

## 2016-11-02 DIAGNOSIS — G8929 Other chronic pain: Secondary | ICD-10-CM

## 2016-11-02 DIAGNOSIS — K909 Intestinal malabsorption, unspecified: Secondary | ICD-10-CM | POA: Diagnosis not present

## 2016-11-02 DIAGNOSIS — Z23 Encounter for immunization: Secondary | ICD-10-CM | POA: Diagnosis not present

## 2016-11-02 DIAGNOSIS — E2839 Other primary ovarian failure: Secondary | ICD-10-CM

## 2016-11-02 DIAGNOSIS — M545 Low back pain, unspecified: Secondary | ICD-10-CM

## 2016-11-02 DIAGNOSIS — G43911 Migraine, unspecified, intractable, with status migrainosus: Secondary | ICD-10-CM

## 2016-11-02 DIAGNOSIS — Z114 Encounter for screening for human immunodeficiency virus [HIV]: Secondary | ICD-10-CM

## 2016-11-02 DIAGNOSIS — F17200 Nicotine dependence, unspecified, uncomplicated: Secondary | ICD-10-CM

## 2016-11-02 DIAGNOSIS — E119 Type 2 diabetes mellitus without complications: Secondary | ICD-10-CM | POA: Diagnosis not present

## 2016-11-02 DIAGNOSIS — E034 Atrophy of thyroid (acquired): Secondary | ICD-10-CM | POA: Insufficient documentation

## 2016-11-02 DIAGNOSIS — E785 Hyperlipidemia, unspecified: Secondary | ICD-10-CM | POA: Insufficient documentation

## 2016-11-02 NOTE — Patient Instructions (Addendum)
Lidocaine gel or patches which are over the counter for back pain.  aspercream with lidocaine or salonpas with lidocaine   To wean lexapro slowly- 1/2 tablet for 1 month, then every other day for a couple of weeks then stop

## 2016-11-02 NOTE — Progress Notes (Signed)
Careteam: Patient Care Team: Gayland Curry, DO as PCP - General (Geriatric Medicine)  Advanced Directive information Does Patient Have a Medical Advance Directive?: No  No Known Allergies  Chief Complaint  Patient presents with  . Medical Management of Chronic Issues    Pt is being seen for a routine visit. Pt would like to discuss a Rx for voltaren gel due to back pain and bone density scan referral     HPI: Patient is a 61 y.o. female seen in the office today for routine follow up.  Pt with hx of smoking, diabetes, anemia, hypothyroid, anxiety and depression.  Has not followed up sine 12/2015 Following with Dr Domingo Cocking due to migraines, increased Zonisamide to 150 mg daily and uses muscle relaxer as needed for severe pain.  Rarely needs this. Plans to call so we can get muscle relaxer on file.   Recently with 2 iron infusions due to ferritin being low and feels much better- going to hematology due to chronic anemia from gastric bypass. Hematologist also checks Vit D and Vit b12.   Taking synthroid 50 mcg for hypothyroid.   Still smoking.  Went to [place] in June, did a 14 mile hike (unable to go the whole 4 day hike but did a lot) .   Portion size is small from gastric bypass, sugar is a trigger for migraines so limits this. Does like bread.   Review of Systems:  Review of Systems  Constitutional: Negative for chills, fever and weight loss.  HENT: Negative for tinnitus.   Respiratory: Negative for cough, sputum production and shortness of breath.   Cardiovascular: Negative for chest pain, palpitations and leg swelling.  Gastrointestinal: Negative for abdominal pain, constipation, diarrhea and heartburn.  Genitourinary: Negative for dysuria, frequency and urgency.  Musculoskeletal: Positive for back pain (lumbar). Negative for falls, joint pain and myalgias.  Skin: Negative.   Neurological: Positive for headaches (controlled). Negative for dizziness.    Psychiatric/Behavioral: Negative for depression and memory loss. The patient does not have insomnia.        Depression/anxiety in remission    Past Medical History:  Diagnosis Date  . Anemia   . Chronic back pain   . Hypothyroidism    Past Surgical History:  Procedure Laterality Date  . CESAREAN SECTION  [id number]  . COLONOSCOPY N/A 08/07/2013   Procedure: COLONOSCOPY;  Surgeon: Danie Binder, MD;  [facility];  Service: Endoscopy;  Laterality: N/A;  10:30-moved to [place] notified pt  . ESOPHAGOGASTRODUODENOSCOPY    . ESOPHAGOGASTRODUODENOSCOPY N/A 08/07/2013   Procedure: ESOPHAGOGASTRODUODENOSCOPY (EGD);  Surgeon: Danie Binder, MD;  [facility];  Service: Endoscopy;  Laterality: N/A;  . GASTRIC BYPASS  [address] [place]  . TONSILLECTOMY     Childhood  . UPPER GI ENDOSCOPY  2004   Dr.Borhanian    Social History:   reports that she has been smoking Cigarettes.  She has a 5.00 pack-year smoking history. She has never used smokeless tobacco. She reports that she does not drink alcohol or use drugs.  Family History  Problem Relation Age of Onset  . Heart disease Mother   . Diabetes Mother   . Stroke Mother   . Hypertension Mother   . Diabetes Father   . Heart disease Father   . Hypertension Father   . Migraines Daughter   . Colon cancer Neg Hx     Medications: Patient's Medications  New Prescriptions  No medications on file  Previous Medications   ACETAMINOPHEN (TYLENOL) 500 MG TABLET    Take 500 mg by mouth every 6 (six) hours as needed for mild pain or headache.   CYANOCOBALAMIN (,VITAMIN B-12,) 1000 MCG/ML INJECTION    Inject 1 mL (1,000 mcg total) into the muscle every 30 (thirty) days.   ESCITALOPRAM (LEXAPRO) 10 MG TABLET    Take 1 tablet (10 mg total) by mouth daily. Need appointment before anymore future refills.   LEVOTHYROXINE (SYNTHROID, LEVOTHROID) 50 MCG TABLET    Take 1 tablet (50 mcg total) by mouth daily. Need  appointment before future refills   VITAMIN D, ERGOCALCIFEROL, (DRISDOL) 50000 UNITS CAPS CAPSULE    TAKE 1 CAPSULE BY MOUTH ONCE A WEEK.   ZONISAMIDE (ZONEGRAN) 25 MG CAPSULE    Take 150 mg by mouth at bedtime.   Modified Medications   No medications on file  Discontinued Medications   BUPROPION (WELLBUTRIN XL) 150 MG 24 HR TABLET    TAKE ONE TABLET BY MOUTH ONCE DAILY.     Physical Exam:  Vitals:   11/02/16 0852  BP: 114/72  Pulse: 66  Resp: 18  Temp: 98 F (36.7 C)  TempSrc: Oral  SpO2: 98%  Weight: 215 lb 9.6 oz (97.8 kg)  Height: '5\' 9"'$  (1.753 m)   Body mass index is 31.84 kg/m.  Physical Exam  Constitutional: She is oriented to person, place, and time. She appears well-developed and well-nourished. No distress.  HENT:  Head: Normocephalic and atraumatic.  Mouth/Throat: Oropharynx is clear and moist. No oropharyngeal exudate.  Eyes: Pupils are equal, round, and reactive to light. Conjunctivae are normal.  Neck: Normal range of motion. Neck supple.  Cardiovascular: Normal rate, regular rhythm and normal heart sounds.   Pulmonary/Chest: Effort normal and breath sounds normal.  Abdominal: Soft. Bowel sounds are normal.  Genitourinary: Vagina normal and uterus normal.  Musculoskeletal: Normal range of motion. She exhibits no edema or tenderness.  Neurological: She is alert and oriented to person, place, and time.  Normal monofilament   Skin: Skin is warm and dry. She is not diaphoretic.  Psychiatric: She has a normal mood and affect.    Labs reviewed: Basic Metabolic Panel:  Recent Labs  09/14/16 1312  NA 139  K 3.9  CL 112*  CO2 23  GLUCOSE 234*  BUN 12  CREATININE 0.94  CALCIUM 8.5*   Liver Function Tests:  Recent Labs  09/14/16 1312  AST 18  ALT 16  ALKPHOS 88  BILITOT 0.4  PROT 6.0*  ALBUMIN 3.4*   No results for input(s): LIPASE, AMYLASE in the last 8760 hours. No results for input(s): AMMONIA in the last 8760 hours. CBC:  Recent Labs   03/09/16 1335 09/14/16 1312  WBC 4.2 3.9*  NEUTROABS 2.3 2.2  HGB 12.0 11.6*  HCT 36.1 35.0*  MCV 98.9 98.6  PLT 250 286   Lipid Panel:  Recent Labs  11/18/15 1001  CHOL 181  HDL 53  LDLCALC 108*  TRIG 101  CHOLHDL 3.4   TSH: No results for input(s): TSH in the last 8760 hours. A1C: Lab Results  Component Value Date   HGBA1C 5.2 11/18/2015     Assessment/Plan .1. Type 2 diabetes mellitus without complication, without long-term current use of insulin (HCC) -a1c currently not in diabetic range. Has been stable and improved since gastric bypass.  - Hemoglobin A1c  2. Hyperlipidemia, unspecified hyperlipidemia type -cont heart healthy diet, will follow up labs today. Encouraged   -  CMP with eGFR - Lipid Panel  3. Hypothyroidism due to acquired atrophy of thyroid -currently on synthroid 50 mcg daily - TSH  4. Malabsorption of iron After gastric bypass surgery, following with hematology, recently with transfusion. Plan to follow up CBC with them in December.   5. Screening for HIV (human immunodeficiency virus) - HIV antibody  6. Estrogen deficiency - DG Bone Density; Future  7. Chronic low back pain without sciatica, unspecified back pain laterality Due to position of standing at work, she sees acupuncturist and massage therapist routinely. Still will have some tenderness at the end of the day and by the end of the week -can use lidocaine OTC gel or patch - Ambulatory referral to Physical Therapy for TENs unit evaluation   8. Major depression, single episode, in complete remission [facility]) -feels like she may wish to stop lexapro. Slow titration encouarged.  9. Smoker -conts to smoke, cessation encouraged.   10. Intractable migraine with status migrainosus, unspecified migraine type -stable, followed by headache specialist who prescribes her zonisamide.   11. Need for pneumococcal vaccination - Pneumococcal polysaccharide vaccine 23-valent greater than or  equal to 2yo subcutaneous/IM  Next appt: year Reiana Poteet K. Harle Battiest  [facility] [phone] 8 am - 5 pm) [phone] (after hours)

## 2016-11-03 LAB — COMPLETE METABOLIC PANEL WITH GFR
AG RATIO: 1.9 (calc) (ref 1.0–2.5)
ALT: 13 U/L (ref 6–29)
AST: 13 U/L (ref 10–35)
Albumin: 3.7 g/dL (ref 3.6–5.1)
Alkaline phosphatase (APISO): 87 U/L (ref 33–130)
BUN: 13 mg/dL (ref 7–25)
CALCIUM: 8.6 mg/dL (ref 8.6–10.4)
CO2: 27 mmol/L (ref 20–32)
Chloride: 109 mmol/L (ref 98–110)
Creat: 0.88 mg/dL (ref 0.50–0.99)
GFR, EST AFRICAN AMERICAN: 83 mL/min/{1.73_m2} (ref >=60)
GFR, EST NON AFRICAN AMERICAN: 71 mL/min/{1.73_m2} (ref >=60)
GLUCOSE: 85 mg/dL (ref 65–99)
Globulin: 2 g/dL (calc) (ref 1.9–3.7)
POTASSIUM: 4.8 mmol/L (ref 3.5–5.3)
Sodium: 141 mmol/L (ref 135–146)
TOTAL PROTEIN: 5.7 g/dL — AB (ref 6.1–8.1)
Total Bilirubin: 0.4 mg/dL (ref 0.2–1.2)

## 2016-11-03 LAB — LIPID PANEL
Cholesterol: 176 mg/dL (ref <200)
HDL: 57 mg/dL (ref >50)
LDL CHOLESTEROL (CALC): 100 mg/dL — AB
NON-HDL CHOLESTEROL (CALC): 119 mg/dL (ref <130)
TRIGLYCERIDES: 99 mg/dL (ref <150)
Total CHOL/HDL Ratio: 3.1 (calc) (ref <5.0)

## 2016-11-03 LAB — HEMOGLOBIN A1C
HEMOGLOBIN A1C: 5.2 %{Hb} (ref <5.7)
MEAN PLASMA GLUCOSE: 103 (calc)
eAG (mmol/L): 5.7 (calc)

## 2016-11-03 LAB — TSH: TSH: 3.1 m[IU]/L (ref 0.40–4.50)

## 2016-11-03 LAB — HIV ANTIBODY (ROUTINE TESTING W REFLEX): HIV: NONREACTIVE

## 2016-12-04 ENCOUNTER — Other Ambulatory Visit: Payer: Self-pay | Admitting: *Deleted

## 2016-12-04 MED ORDER — LEVOTHYROXINE SODIUM 50 MCG PO TABS: 50.0000 ug | ORAL_TABLET | Freq: Every day | ORAL | 3 refills | Status: DC

## 2016-12-04 MED ORDER — ESCITALOPRAM OXALATE 10 MG PO TABS: 10.0000 mg | ORAL_TABLET | Freq: Every day | ORAL | 3 refills | Status: DC

## 2016-12-04 NOTE — Telephone Encounter (Signed)
[facility] 

## 2016-12-07 ENCOUNTER — Encounter: Payer: Self-pay | Admitting: Rehabilitation

## 2016-12-07 ENCOUNTER — Ambulatory Visit: Payer: BLUE CROSS/BLUE SHIELD | Attending: Nurse Practitioner | Admitting: Rehabilitation

## 2016-12-07 DIAGNOSIS — M545 Low back pain, unspecified: Secondary | ICD-10-CM

## 2016-12-07 DIAGNOSIS — G8929 Other chronic pain: Secondary | ICD-10-CM | POA: Diagnosis not present

## 2016-12-07 NOTE — Patient Instructions (Signed)
   [facility]

## 2016-12-07 NOTE — Therapy (Signed)
[facility] [address], [address] [place], [place], [address] Phone: [phone]   Fax:  [phone]  Physical Therapy Evaluation  Patient Details  Name: April LewandowskyCynthia A Wisehart MRN: [id number] Date of Birth: 05/10/1955 Referring Provider: Abbey ChattersJessica Eubanks NP   Encounter Date: 12/07/2016  PT End of Session - 12/07/16 0930    Visit Number  1    Number of Visits  8    Date for PT Re-Evaluation  02/01/17    Authorization Type  BCBS    PT Start Time  0815    PT Stop Time  0845    PT Time Calculation (min)  30 min    Activity Tolerance  Patient tolerated treatment well       Past Medical History:  Diagnosis Date  . Anemia   . Chronic back pain   . Hypothyroidism     Past Surgical History:  Procedure Laterality Date  . CESAREAN SECTION  [id number],1991  . COLONOSCOPY N/A 08/07/2013   Procedure: COLONOSCOPY;  Surgeon: West BaliSandi L Fields, MD;  [facility];  Service: Endoscopy;  Laterality: N/A;  10:30-moved to [address] notified pt  . ESOPHAGOGASTRODUODENOSCOPY    . ESOPHAGOGASTRODUODENOSCOPY N/A 08/07/2013   Procedure: ESOPHAGOGASTRODUODENOSCOPY (EGD);  Surgeon: West BaliSandi L Fields, MD;  [facility];  Service: Endoscopy;  Laterality: N/A;  . GASTRIC BYPASS  [address]   [place], [place]  . TONSILLECTOMY     Childhood  . UPPER GI ENDOSCOPY  2004   Dr.Borhanian     There were no vitals filed for this visit.   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  Starting worsening about 4 years ago.  Likes to try more holistic treatments like acupunture x 1x per week, chiropractor as needed, heat, lidocaine patches or cream.  The pain presents more towards the end of the day having worked.  Doesn't notice it much during the day when working.  Has to lift her dog up onto the bed which also hurts.      Pertinent History  migraines unable to use tylenol or motrin    How  long can you sit comfortably?  no limits    How long can you stand comfortably?  no limits    How long can you walk comfortably?  no limits    Diagnostic tests  none    Patient Stated Goals  learn exercises to save the back during work.      Currently in Pain?  Yes    Pain Score  2     Pain Orientation  Mid;Lower    Pain Descriptors / Indicators  Tightness;Dull    Pain Radiating Towards  denies N/T or numbness    Pain Onset  More than a month ago    Pain Frequency  Intermittent    Aggravating Factors   working as a Education officer, communitydentist    Pain Relieving Factors  rest, heat         [facility] PT Assessment - 12/07/16 0001      Assessment   Medical Diagnosis  LBP    Referring Provider  Abbey ChattersJessica Eubanks NP    Onset Date/Surgical Date  01/09/12    Next MD Visit  as needed    Prior Therapy  no      Precautions   Precautions  None      Balance Screen   Has the  patient fallen in the past 6 months  No      Prior Function   Level of Independence  Independent    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      Observation/Other Assessments   Focus on Therapeutic Outcomes (FOTO)   30% limited      Posture/Postural Control   Posture/Postural Control  Postural limitations    Postural Limitations  Rounded Shoulders;Decreased lumbar lordosis;Increased thoracic kyphosis      ROM / Strength   AROM / PROM / Strength  AROM;Strength      AROM   AROM Assessment Site  Lumbar    Lumbar Flexion  WNL  tightness    Lumbar Extension  WNL pain; RMs x 6: pain worse    Lumbar - Right Side Bend  WNL pain    Lumbar - Left Side Bend  WNL pain    Lumbar - Right Rotation  WNL    Lumbar - Left Rotation  WNL      Flexibility   Soft Tissue Assessment /Muscle Length  yes    Hamstrings  to 90    Piriformis  slight tightness      Palpation   Spinal mobility  no pain with CPAs lumbar spine but muscular pain with attempted UPAs    Palpation comment  2 ttp bil lumbar paraspinals, Multifidi, QL,  tenderness to palpation bil PSIS      Ambulation/Gait   Gait Comments  normal             Objective measurements completed on examination: See above findings.              PT Education - 12/07/16 0929    Education provided  Yes    Education Details  TA activation exercise and home TENS from [place], POC    Person(s) Educated  Patient    Methods  Explanation;Handout    Comprehension  Verbalized understanding;Need further instruction          PT Long Term Goals - 12/07/16 0941      PT LONG TERM GOAL #1   Title  Pt will be independent with final HEP to prevent back pain from progressing at work    Time  8    Period  Weeks    Status  New    Target Date  02/01/17      PT LONG TERM GOAL #2   Title  Pt will demonstrate painfree lumbar AROM    Time  8    Period  Weeks    Status  New    Target Date  02/01/17      PT LONG TERM GOAL #3   Title  Pt will report decrease in end of the day stiffness of 50% or more    Time  8    Period  Weeks    Status  New    Target Date  02/01/17             Plan - 12/07/16 0930    Clinical Impression Statement  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  She reports the pain is not bad during work but is very stiff and sore by the end of the day.  She reports pain relief from chiropractor, massage, heat, and resting.  Is unable to take many medications due  to migraines.  Deficits include pain with lumbar sidebending bilateral and extension, postural abnormalities in seated with increased PPT, tenderness to palpation globally in the lower lumbar spine, and decreased core endurance per subjective complaints.  Pt arriving late to eval so LE and other strength testing not performed.  ALso unable to perform HEP.      Clinical Presentation  Evolving    Clinical Presentation due to:  pain worsening    Clinical Decision  Making  Low    Rehab Potential  Excellent    PT Frequency  1x / week    PT Duration  8 weeks    PT Treatment/Interventions  ADLs/Self Care Home Management;Electrical Stimulation;Moist Heat;Traction;Therapeutic exercise;Manual techniques;Patient/family education;Neuromuscular re-education;Dry needling;Taping    PT Next Visit Plan  review TA activation from HEP, begin lumbar flexibility, core re-reducation and strengthening, consider traction, manual and modalities PRN    PT Home Exercise Plan  hooklying TA, TENS unit    Consulted and Agree with Plan of Care  Patient       Patient will benefit from skilled therapeutic intervention in order to improve the following deficits and impairments:  Decreased activity tolerance, Decreased strength, Pain  Visit Diagnosis: Chronic midline low back pain without sciatica - Plan: PT plan of care cert/re-cert     Problem List Patient Active Problem List   Diagnosis Date Noted  . Type 2 diabetes mellitus without complication, without long-term current use of insulin (HCC) 11/02/2016  . Hyperlipidemia 11/02/2016  . Hypothyroidism due to acquired atrophy of thyroid 11/02/2016  . Iron deficiency anemia 09/14/2016  . Malabsorption of iron 09/10/2013  . H/O gastric bypass 09/10/2013  . Postoperative malabsorption 07/29/2013  . Encounter for screening colonoscopy 07/29/2013  . Anemia 07/29/2013    Idamae Lusherevis, Onica Davidovich R, DPT, CMP 12/07/2016, 11:18 AM  [facility] [address], [address] [place], [place], [address] Phone: [phone]   Fax:  [phone]  Name: April LewandowskyCynthia A Chismar MRN: [id number] Date of Birth: 01/01/1956

## 2016-12-14 ENCOUNTER — Encounter (HOSPITAL_COMMUNITY): Payer: BLUE CROSS/BLUE SHIELD | Attending: Oncology

## 2016-12-14 ENCOUNTER — Encounter (HOSPITAL_BASED_OUTPATIENT_CLINIC_OR_DEPARTMENT_OTHER): Payer: BLUE CROSS/BLUE SHIELD | Admitting: Oncology

## 2016-12-14 ENCOUNTER — Ambulatory Visit: Payer: BLUE CROSS/BLUE SHIELD | Attending: Nurse Practitioner | Admitting: Physical Therapy

## 2016-12-14 ENCOUNTER — Encounter (HOSPITAL_COMMUNITY): Payer: Self-pay

## 2016-12-14 VITALS — BP 131/77 | HR 71 | Temp 97.8°F | Resp 18 | Wt 214.0 lb

## 2016-12-14 DIAGNOSIS — D508 Other iron deficiency anemias: Secondary | ICD-10-CM

## 2016-12-14 DIAGNOSIS — G8929 Other chronic pain: Secondary | ICD-10-CM | POA: Insufficient documentation

## 2016-12-14 DIAGNOSIS — D509 Iron deficiency anemia, unspecified: Secondary | ICD-10-CM

## 2016-12-14 DIAGNOSIS — M545 Low back pain, unspecified: Secondary | ICD-10-CM

## 2016-12-14 DIAGNOSIS — E538 Deficiency of other specified B group vitamins: Secondary | ICD-10-CM | POA: Diagnosis not present

## 2016-12-14 DIAGNOSIS — K909 Intestinal malabsorption, unspecified: Secondary | ICD-10-CM

## 2016-12-14 DIAGNOSIS — Z9884 Bariatric surgery status: Secondary | ICD-10-CM

## 2016-12-14 DIAGNOSIS — K912 Postsurgical malabsorption, not elsewhere classified: Secondary | ICD-10-CM

## 2016-12-14 DIAGNOSIS — E559 Vitamin D deficiency, unspecified: Secondary | ICD-10-CM | POA: Diagnosis not present

## 2016-12-14 DIAGNOSIS — M549 Dorsalgia, unspecified: Secondary | ICD-10-CM | POA: Insufficient documentation

## 2016-12-14 DIAGNOSIS — E039 Hypothyroidism, unspecified: Secondary | ICD-10-CM | POA: Insufficient documentation

## 2016-12-14 DIAGNOSIS — F17210 Nicotine dependence, cigarettes, uncomplicated: Secondary | ICD-10-CM | POA: Insufficient documentation

## 2016-12-14 LAB — COMPREHENSIVE METABOLIC PANEL
ALT: 15 U/L (ref 14–54)
AST: 16 U/L (ref 15–41)
Albumin: 3.7 g/dL (ref 3.5–5.0)
Alkaline Phosphatase: 96 U/L (ref 38–126)
Anion gap: 4 — ABNORMAL LOW (ref 5–15)
BILIRUBIN TOTAL: 0.6 mg/dL (ref 0.3–1.2)
BUN: 10 mg/dL (ref 6–20)
CO2: 25 mmol/L (ref 22–32)
Calcium: 8.9 mg/dL (ref 8.9–10.3)
Chloride: 109 mmol/L (ref 101–111)
Creatinine, Ser: 0.86 mg/dL (ref 0.44–1.00)
Glucose, Bld: 132 mg/dL — ABNORMAL HIGH (ref 65–99)
POTASSIUM: 3.7 mmol/L (ref 3.5–5.1)
Sodium: 138 mmol/L (ref 135–145)
TOTAL PROTEIN: 6.5 g/dL (ref 6.5–8.1)

## 2016-12-14 LAB — CBC WITH DIFFERENTIAL/PLATELET
BASOS ABS: 0 10*3/uL (ref 0.0–0.1)
Basophils Relative: 1 %
EOS PCT: 4 %
Eosinophils Absolute: 0.2 10*3/uL (ref 0.0–0.7)
HEMATOCRIT: 38.1 % (ref 36.0–46.0)
Hemoglobin: 12.2 g/dL (ref 12.0–15.0)
LYMPHS ABS: 1.2 10*3/uL (ref 0.7–4.0)
LYMPHS PCT: 21 %
MCH: 32.4 pg (ref 26.0–34.0)
MCHC: 32 g/dL (ref 30.0–36.0)
MCV: 101.1 fL — AB (ref 78.0–100.0)
MONO ABS: 0.4 10*3/uL (ref 0.1–1.0)
MONOS PCT: 7 %
NEUTROS ABS: 3.9 10*3/uL (ref 1.7–7.7)
Neutrophils Relative %: 69 %
PLATELETS: 248 10*3/uL (ref 150–400)
RBC: 3.77 MIL/uL — ABNORMAL LOW (ref 3.87–5.11)
RDW: 12.5 % (ref 11.5–15.5)
WBC: 5.6 10*3/uL (ref 4.0–10.5)

## 2016-12-14 LAB — IRON AND TIBC
IRON: 57 ug/dL (ref 28–170)
Saturation Ratios: 24 % (ref 10.4–31.8)
TIBC: 242 ug/dL — AB (ref 250–450)
UIBC: 185 ug/dL

## 2016-12-14 LAB — VITAMIN B12: Vitamin B-12: 281 pg/mL (ref 180–914)

## 2016-12-14 LAB — FERRITIN: Ferritin: 193 ng/mL (ref 11–307)

## 2016-12-14 MED ORDER — VITAMIN D (ERGOCALCIFEROL) 1.25 MG (50000 UNIT) PO CAPS: 50000.0000 [IU] | ORAL_CAPSULE | ORAL | 6 refills | Status: DC

## 2016-12-14 MED ORDER — CYANOCOBALAMIN 1000 MCG/ML IJ SOLN: 1000.0000 ug | INTRAMUSCULAR | 11 refills | Status: DC

## 2016-12-14 NOTE — Patient Instructions (Signed)
[facility]  Discharge Instructions:  You were seen by dr. zhou today _______________________________________________________________  Thank you for choosing [facility] to provide your oncology and hematology care.  To afford each patient quality time with our providers, please arrive at least 15 minutes before your scheduled appointment.  You need to re-schedule your appointment if you arrive 10 or more minutes late.  We strive to give you quality time with our providers, and arriving late affects you and other patients whose appointments are after yours.  Also, if you no show three or more times for appointments you may be dismissed from the clinic.  Again, thank you for choosing [facility]. Our hope is that these requests will allow you access to exceptional care and in a timely manner. _______________________________________________________________  If you have questions after your visit, please contact our office at [phone] between the hours of 8:30 a.m. and 5:00 p.m. Voicemails left after 4:30 p.m. will not be returned until the following business day. _______________________________________________________________  For prescription refill requests, have your pharmacy contact our office. _______________________________________________________________  Recommendations made by the consultant and any test results will be sent to your referring physician. _______________________________________________________________ 

## 2016-12-14 NOTE — Therapy (Signed)
[facility] [address], [address] [place], [place], [address] Phone: [phone]   Fax:  [phone]  Physical Therapy Treatment  Patient Details  Name: April [name] MRN: [id number] Date of Birth: 05/26/1955 Referring Provider: Abbey ChattersJessica Eubanks NP   Encounter Date: 12/14/2016  PT End of Session - 12/14/16 0901    Visit Number  2    Number of Visits  8    Date for PT Re-Evaluation  02/01/17    Authorization Type  BCBS    PT Start Time  [phone]    PT Stop Time  0932    PT Time Calculation (min)  40 min    Activity Tolerance  Patient tolerated treatment well       Past Medical History:  Diagnosis Date  . Anemia   . Chronic back pain   . Hypothyroidism     Past Surgical History:  Procedure Laterality Date  . CESAREAN SECTION  [id number],1991  . COLONOSCOPY N/A 08/07/2013   Procedure: COLONOSCOPY;  Surgeon: West BaliSandi L Fields, MD;  [facility];  Service: Endoscopy;  Laterality: N/A;  10:30-moved to [address] notified pt  . ESOPHAGOGASTRODUODENOSCOPY    . ESOPHAGOGASTRODUODENOSCOPY N/A 08/07/2013   Procedure: ESOPHAGOGASTRODUODENOSCOPY (EGD);  Surgeon: West BaliSandi L Fields, MD;  [facility];  Service: Endoscopy;  Laterality: N/A;  . GASTRIC BYPASS  [address]   [place], [place]  . TONSILLECTOMY     Childhood  . UPPER GI ENDOSCOPY  2004   Dr.Borhanian     There were no vitals filed for this visit.  Subjective Assessment - 12/14/16 0856    Subjective  Pt reports a little tightness in mid-lower back and sore in glutes.  Pt fell on her bottom when sliding on her rolling chair on Wednesday.      Pertinent History  migraines unable to use tylenol or motrin    Patient Stated Goals  learn exercises to save the back during work.      Currently in Pain?  Yes    Pain Score  6     Pain Location  Buttocks    Pain Orientation  Right;Left    Pain Descriptors / Indicators  Sore    Pain Type  Acute pain    Pain Onset  More  than a month ago    Pain Frequency  Intermittent    Aggravating Factors   going from sitting to standing    Pain Relieving Factors  rest and stretching hurts but then feels good    Multiple Pain Sites  No                      [facility] Adult PT Treatment/Exercise - 12/14/16 0001      Neuro Re-ed    Neuro Re-ed Details   Transverse abdominal bracing with movements      Exercises   Exercises  Lumbar      Lumbar Exercises: Stretches   Lower Trunk Rotation  5 reps both sides    Press Ups  2 reps;30 seconds scap squeeze and thoracic extension      Lumbar Exercises: Supine   Clam  15 reps single leg red band    Dead Bug  10 reps legs only, keeping knees bent    Large Ball Abdominal Isometric  10 reps 10x UE, 10x LE      Lumbar Exercises: Sidelying   Other Sidelying Lumbar Exercises  open book stretch  Lumbar Exercises: Quadruped   Madcat/Old Horse  10 reps    Single Arm Raise  10 reps             PT Education - 12/14/16 0932    Education provided  Yes    Education Details  HEP as seen in chart    Person(s) Educated  Patient    Methods  Explanation;Demonstration;Handout;Verbal cues;Tactile cues    Comprehension  Verbalized understanding;Returned demonstration          PT Long Term Goals - 12/14/16 1007      PT LONG TERM GOAL #1   Title  Pt will be independent with final HEP to prevent back pain from progressing at work    Time  8    Period  Weeks    Status  On-going      PT LONG TERM GOAL #2   Title  Pt will demonstrate painfree lumbar AROM    Time  8    Period  Weeks    Status  On-going      PT LONG TERM GOAL #3   Title  Pt will report decrease in end of the day stiffness of 50% or more    Time  8    Period  Weeks    Status  On-going            Plan - 12/14/16 0933    Clinical Impression Statement  Pt is doing well today other than muscle soreness from a fall.  Pt did well with exercises and was educated in stretches and core  strengthening routine . Pt will benefit from skilled PT to progress core strength and lumbar ROM in order to manage pain and perform functional job related duties.    Rehab Potential  Excellent    PT Treatment/Interventions  ADLs/Self Care Home Management;Electrical Stimulation;Moist Heat;Traction;Therapeutic exercise;Manual techniques;Patient/family education;Neuromuscular re-education;Dry needling;Taping    PT Next Visit Plan  review HEP, progress strength of core and continue to work on lumbar rotation, thoracic extension, manual and modalities as needed    Consulted and Agree with Plan of Care  Patient       Patient will benefit from skilled therapeutic intervention in order to improve the following deficits and impairments:  Decreased activity tolerance, Decreased strength, Pain  Visit Diagnosis: Chronic midline low back pain without sciatica     Problem List Patient Active Problem List   Diagnosis Date Noted  . Type 2 diabetes mellitus without complication, without long-term current use of insulin (HCC) 11/02/2016  . Hyperlipidemia 11/02/2016  . Hypothyroidism due to acquired atrophy of thyroid 11/02/2016  . Iron deficiency anemia 09/14/2016  . Malabsorption of iron 09/10/2013  . H/O gastric bypass 09/10/2013  . Postoperative malabsorption 07/29/2013  . Encounter for screening colonoscopy 07/29/2013  . Anemia 07/29/2013    [name], PT 12/14/2016, 10:11 AM  [facility] [address], [address] [place], [place], [address] Phone: [phone]   Fax:  [phone]  Name: April [name] MRN: [id number] Date of Birth: 08/23/1955

## 2016-12-14 NOTE — Progress Notes (Signed)
April [name], April K, NP [address] [place] [address]    DIAGNOSIS:  Iron deficiency anemia secondary to malabsorption History of gastric bypass B12 deficiency, on B12 IM Hypocalcemia Colonoscopy on 08/07/2013 with a large redundant colon and internal/external hemorrhoids EGD on 08/07/2013 with a Schatzki ring at the GE junction and small clean-based ulcer at anastomosis  CURRENT THERAPY: IV iron   INTERVAL HISTORY: April [name] 61 y.o. female returns for iron deficiency anemia. She has a history of gastric bypass.     MEDICAL HISTORY: Past Medical History:  Diagnosis Date  . Anemia   . Chronic back pain   . Hypothyroidism     has Postoperative malabsorption; Encounter for screening colonoscopy; Anemia; Malabsorption of iron; H/O gastric bypass; Iron deficiency anemia; Type 2 diabetes mellitus without complication, without long-term current use of insulin (HCC); Hyperlipidemia; and Hypothyroidism due to acquired atrophy of thyroid on their problem list.     has No Known Allergies.  April [name] had no medications administered during this visit.  SURGICAL HISTORY: Past Surgical History:  Procedure Laterality Date  . CESAREAN SECTION  [id number],1991  . COLONOSCOPY N/A 08/07/2013   Procedure: COLONOSCOPY;  Surgeon: West BaliSandi L Fields, MD;  [facility];  Service: Endoscopy;  Laterality: N/A;  10:30-moved to [address] notified pt  . ESOPHAGOGASTRODUODENOSCOPY    . ESOPHAGOGASTRODUODENOSCOPY N/A 08/07/2013   Procedure: ESOPHAGOGASTRODUODENOSCOPY (EGD);  Surgeon: West BaliSandi L Fields, MD;  [facility];  Service: Endoscopy;  Laterality: N/A;  . GASTRIC BYPASS  [address]   [place], [place]  . TONSILLECTOMY     Childhood  . UPPER GI ENDOSCOPY  2004   Dr.Borhanian     SOCIAL HISTORY: Social History   Socioeconomic History  . Marital status: Married    Spouse name: Not on file  . Number of children: Not on file  . Years of education: Not on  file  . Highest education level: Not on file  Social Needs  . Financial resource strain: Not on file  . Food insecurity - worry: Not on file  . Food insecurity - inability: Not on file  . Transportation needs - medical: Not on file  . Transportation needs - non-medical: Not on file  Occupational History  . Occupation: Environmental health practitionerDentist    Employer: [employer]  Tobacco Use  . Smoking status: Current Every Day Smoker    Packs/day: 0.50    Years: 10.00    Pack years: 5.00    Types: Cigarettes  . Smokeless tobacco: Never Used  . Tobacco comment: Relasped, 4 cig daily   Substance and Sexual Activity  . Alcohol use: No    Alcohol/week: 0.0 oz  . Drug use: No  . Sexual activity: Yes  Other Topics Concern  . Not on file  Social History Narrative   Married, 1990, has 2 children. Patient lives in a multi-level home. Previously smoked 1/2 PPD, smoked for 10-20 years. Drinks a minimal amount of caffeinated beverages.     FAMILY HISTORY: Family History  Problem Relation Age of Onset  . Heart disease Mother   . Diabetes Mother   . Stroke Mother   . Hypertension Mother   . Diabetes Father   . Heart disease Father   . Hypertension Father   . Migraines Daughter   . Colon cancer Neg Hx     Review of Systems  Constitutional: Negative.   HENT: Negative.   Eyes: Negative.   Respiratory: Negative.  Cardiovascular: Negative.   Gastrointestinal: Negative.   Genitourinary: Negative.   Musculoskeletal: Negative.   Skin: Negative.   Neurological: Negative.   Endo/Heme/Allergies: Negative.   Psychiatric/Behavioral: Negative.   All other systems reviewed and are negative.  14 point review of systems was performed and is negative except as detailed under history of present illness and above   PHYSICAL EXAMINATION  ECOG PERFORMANCE STATUS: 0 - Asymptomatic  Vitals:   12/14/16 1436  BP: 131/77  Pulse: 71  Resp: 18  Temp: 97.8 F (36.6 C)  SpO2: 99%    Physical Exam    Constitutional: She is oriented to person, place, and time and well-developed, well-nourished, and in no distress.  HENT:  Head: Normocephalic and atraumatic.  Mouth/Throat: Oropharynx is clear and moist.  Eyes: Conjunctivae and EOM are normal. Pupils are equal, round, and reactive to light.  Neck: Normal range of motion. Neck supple.  Cardiovascular: Normal rate, regular rhythm and normal heart sounds.  Pulmonary/Chest: Effort normal and breath sounds normal.  Abdominal: Soft. Bowel sounds are normal.  Musculoskeletal: Normal range of motion.  Neurological: She is alert and oriented to person, place, and time. Gait normal.  Skin: Skin is warm and dry.  Nursing note and vitals reviewed.   LABORATORY DATA: I have reviewed the data as listed.  CBC    Component Value Date/Time   WBC 5.6 12/14/2016 1410   RBC 3.77 (L) 12/14/2016 1410   HGB 12.2 12/14/2016 1410   HCT 38.1 12/14/2016 1410   PLT 248 12/14/2016 1410   MCV 101.1 (H) 12/14/2016 1410   MCH 32.4 12/14/2016 1410   MCHC 32.0 12/14/2016 1410   RDW 12.5 12/14/2016 1410   LYMPHSABS 1.2 12/14/2016 1410   MONOABS 0.4 12/14/2016 1410   EOSABS 0.2 12/14/2016 1410   BASOSABS 0.0 12/14/2016 1410   CMP     Component Value Date/Time   NA 138 12/14/2016 1410   [name] 3.7 12/14/2016 1410   CL 109 12/14/2016 1410   CO2 25 12/14/2016 1410   GLUCOSE 132 (H) 12/14/2016 1410   BUN 10 12/14/2016 1410   CREATININE 0.86 12/14/2016 1410   CREATININE 0.88 11/02/2016 0949   CALCIUM 8.9 12/14/2016 1410   PROT 6.5 12/14/2016 1410   ALBUMIN 3.7 12/14/2016 1410   AST 16 12/14/2016 1410   ALT 15 12/14/2016 1410   ALKPHOS 96 12/14/2016 1410   BILITOT 0.6 12/14/2016 1410   GFRNONAA >60 12/14/2016 1410   GFRNONAA 71 11/02/2016 0949   GFRAA >60 12/14/2016 1410   GFRAA 83 11/02/2016 0949     ASSESSMENT and THERAPY PLAN:  Gastric Bypass Iron deficiency B12 deficency Hypocalcemia Colonoscopy on 08/07/2013 with a large redundant colon and  internal/external hemorrhoids EGD on 08/07/2013 with a Schatzki ring at the GE junction and small clean-based ulcer at anastomosis Vitamin D deficiency  Patient doing well. She is a little anemic today with a hemoglobin of 11.6. I have discussed with her that I will follow up her iron studies, if her ferritin is below 100, then I will calculate her iron deficit and plan to give her some IV iron.  Continue montly vitamin B12 injections at home.  RTC in 3 months with CBC, CMP, iron studies, vitamin B12.  Orders Placed This Encounter  Procedures  . CBC with Differential    Standing Status:   Standing    Number of Occurrences:   2    Standing Expiration Date:   12/14/2017  . Comprehensive metabolic panel  Standing Status:   Standing    Number of Occurrences:   2    Standing Expiration Date:   12/14/2017  . Iron and TIBC    Standing Status:   Standing    Number of Occurrences:   2    Standing Expiration Date:   12/14/2017  . Ferritin    Standing Status:   Standing    Number of Occurrences:   2    Standing Expiration Date:   12/14/2017  . Vitamin B12    Standing Status:   Standing    Number of Occurrences:   2    Standing Expiration Date:   12/14/2017   All questions were answered. The patient knows to call the clinic with any problems, questions or concerns. We can certainly see the patient much sooner if necessary.  This document serves as a record of services personally performed by Ralene CorkLouise Otila Starn, MD. It was created on her behalf by Delana MeyerElizabeth Ashley, a trained medical scribe. The creation of this record is based on the scribe's personal observations and the provider's statements to them. This document has been checked and approved by the attending provider.  I have reviewed the above documentation for accuracy and completeness and I agree with the above.  This note was electronically signed. Ralene CorkLouise Juanluis Guastella, MD  12/14/2016

## 2016-12-14 NOTE — Patient Instructions (Signed)
   Open and hold for 5 seconds; repeat 5 x each side     Slowly rocking side to side 10x      Press up and relax low back.  Engage shoulder blades down and back getting extension in upper back - hold30 sec do 3 sets    Do legs only, from starting position bring one foot down to the floor keeping core engaged      Brace abdominals and as you exhale reach forward - repeat 10x    Keep pelvis neutral  And bring right leg to the side and slowly back - 10 x and repeat on left 10x   [facility] [address], [address] [place], [place] [address] Phone # [phone] Fax [phone]

## 2016-12-21 ENCOUNTER — Encounter: Payer: Self-pay | Admitting: Physical Therapy

## 2016-12-21 ENCOUNTER — Ambulatory Visit: Payer: BLUE CROSS/BLUE SHIELD | Admitting: Physical Therapy

## 2016-12-21 DIAGNOSIS — G8929 Other chronic pain: Secondary | ICD-10-CM

## 2016-12-21 DIAGNOSIS — M545 Low back pain: Principal | ICD-10-CM

## 2016-12-21 NOTE — Patient Instructions (Signed)
   SIT TO STAND - NO SUPPORT  Start by scooting close to the front of the chair.  Next, lean forward at your trunk and reach forward with your arms and rise to standing without using your hands to push off from the chair or other object.   Use your arms as a counter-balance by reaching forward when in sitting and lower them as you approach standing.   Stop halfway before sitting and do 5 sec hold - keep bottom back towards the chair and naval drawn towards spine - repeat 5x do 2 sets throughout the day  [facility] [address], [address] [place], [place] [address] Phone # [phone] Fax [phone]

## 2016-12-21 NOTE — Therapy (Signed)
[facility] [address] [place], [place], [address] Phone: [phone]   Fax:  [phone]  Physical Therapy Treatment  Patient Details  Name: April LewandowskyCynthia A [name] MRN: [id number] Date of Birth: 04/18/1955 Referring Provider: Abbey ChattersJessica Eubanks NP   Encounter Date: 12/21/2016  PT End of Session - 12/21/16 0855    Visit Number  3    Number of Visits  8    Date for PT Re-Evaluation  02/01/17    Authorization Type  BCBS    PT Start Time  [phone]    PT Stop Time  0930    PT Time Calculation (min)  38 min    Activity Tolerance  Patient tolerated treatment well       Past Medical History:  Diagnosis Date  . Anemia   . Chronic back pain   . Hypothyroidism     Past Surgical History:  Procedure Laterality Date  . CESAREAN SECTION  [id number],1991  . COLONOSCOPY N/A 08/07/2013   Procedure: COLONOSCOPY;  Surgeon: West BaliSandi L Fields, MD;  [facility];  Service: Endoscopy;  Laterality: N/A;  10:30-moved to [address] notified pt  . ESOPHAGOGASTRODUODENOSCOPY    . ESOPHAGOGASTRODUODENOSCOPY N/A 08/07/2013   Procedure: ESOPHAGOGASTRODUODENOSCOPY (EGD);  Surgeon: West BaliSandi L Fields, MD;  [facility];  Service: Endoscopy;  Laterality: N/A;  . GASTRIC BYPASS  [address]   [place], [place]  . TONSILLECTOMY     Childhood  . UPPER GI ENDOSCOPY  2004   Dr.Borhanian     There were no vitals filed for this visit.  Subjective Assessment - 12/21/16 0935    Subjective  Denies pain at rest, no back pain.  Pt reports still having glute pain from when she fell.  Alble to do stretches at home and throughout the day which is helping.    Pertinent History  migraines unable to use tylenol or motrin    Patient Stated Goals  learn exercises to save the back during work.      Currently in Pain?  No/denies                      [facility] - 12/21/16 0001      Therapeutic Activites    Therapeutic  Activities  ADL's    ADL's  sit to stand keeping pelvic alignment and core enganged      Lumbar Exercises: Supine   Other Supine Lumbar Exercises  lying on foam roll verticle under pelvis - single and double knee to chest - 20sec holds; rocking side to side 10x each way      Lumbar Exercises: Quadruped   [place] Horse  10 reps    Opposite Arm/Leg Raise  Right arm/Left leg;Left arm/Right leg;5 reps      Knee/Hip Exercises: Seated   Sit to Sand  5 reps;2 sets;without UE support black foam on mat table, then 5 reps with hold, red band             PT Education - 12/21/16 0932    Education provided  Yes    Education Details  foam roll and added sit to stand    Person(s) Educated  Patient    Methods  Explanation;Demonstration;Verbal cues;Handout    Comprehension  Verbalized understanding;Returned demonstration          PT Long Term Goals - 12/21/16 0855      PT LONG TERM GOAL #1   Title  Pt  will be independent with final HEP to prevent back pain from progressing at work    Time  8    Period  Weeks    Status  On-going      PT LONG TERM GOAL #2   Title  Pt will demonstrate painfree lumbar AROM    Time  8    Period  Weeks    Status  On-going      PT LONG TERM GOAL #3   Title  Pt will report decrease in end of the day stiffness of 50% or more    Time  8    Period  Weeks    Status  On-going            Plan - 12/21/16 0936    Clinical Impression Statement  Pt isdoing well with progress of core exercises.  She has some soreness when using glutes due to muscle contusion from fall.  Pt will benefit from skilled PT to work on greater core and pelvic stability during functional movements    PT Treatment/Interventions  ADLs/Self Care Home Management;Electrical Stimulation;Moist Heat;Traction;Therapeutic exercise;Manual techniques;Patient/family education;Neuromuscular re-education;Dry needling;Taping    PT Next Visit Plan  review HEP, progress strength of core and  continue to work on lumbar rotation, thoracic extension, manual and modalities as needed    Consulted and Agree with Plan of Care  Patient       Patient will benefit from skilled therapeutic intervention in order to improve the following deficits and impairments:  Decreased activity tolerance, Decreased strength, Pain  Visit Diagnosis: Chronic midline low back pain without sciatica     Problem List Patient Active Problem List   Diagnosis Date Noted  . Type 2 diabetes mellitus without complication, without long-term current use of insulin (HCC) 11/02/2016  . Hyperlipidemia 11/02/2016  . Hypothyroidism due to acquired atrophy of thyroid 11/02/2016  . Iron deficiency anemia 09/14/2016  . Malabsorption of iron 09/10/2013  . H/O gastric bypass 09/10/2013  . Postoperative malabsorption 07/29/2013  . Encounter for screening colonoscopy 07/29/2013  . Anemia 07/29/2013    [name], PT 12/21/2016, 9:38 AM  [facility] [address] [place], [place], [address] Phone: [phone]   Fax:  [phone]  Name: April LewandowskyCynthia A Offner MRN: [id number] Date of Birth: 08/27/1955

## 2016-12-28 ENCOUNTER — Encounter: Payer: Self-pay | Admitting: Physical Therapy

## 2016-12-28 ENCOUNTER — Ambulatory Visit: Payer: BLUE CROSS/BLUE SHIELD | Admitting: Physical Therapy

## 2016-12-28 DIAGNOSIS — G8929 Other chronic pain: Secondary | ICD-10-CM

## 2016-12-28 DIAGNOSIS — M545 Low back pain: Principal | ICD-10-CM

## 2016-12-28 NOTE — Therapy (Signed)
[facility] [address] [place], [place], [address] Phone: [phone]   Fax:  [phone]  Physical Therapy Treatment  Patient Details  Name: April [name] MRN: [id number] Date of Birth: 07/25/1955 Referring Provider: Abbey ChattersJessica Eubanks NP   Encounter Date: 12/28/2016  PT End of Session - 12/28/16 0853    Visit Number  4    Number of Visits  8    Date for PT Re-Evaluation  02/01/17    Authorization Type  BCBS    PT Start Time  [phone] pt arrived 7 minutes late    PT Stop Time  0930    PT Time Calculation (min)  38 min    Activity Tolerance  Patient tolerated treatment well    Behavior During Therapy  [facility] for tasks assessed/performed       Past Medical History:  Diagnosis Date  . Anemia   . Chronic back pain   . Hypothyroidism     Past Surgical History:  Procedure Laterality Date  . CESAREAN SECTION  [id number],1991  . COLONOSCOPY N/A 08/07/2013   Procedure: COLONOSCOPY;  Surgeon: West BaliSandi L Fields, MD;  [facility];  Service: Endoscopy;  Laterality: N/A;  10:30-moved to [address] notified pt  . ESOPHAGOGASTRODUODENOSCOPY    . ESOPHAGOGASTRODUODENOSCOPY N/A 08/07/2013   Procedure: ESOPHAGOGASTRODUODENOSCOPY (EGD);  Surgeon: West BaliSandi L Fields, MD;  [facility];  Service: Endoscopy;  Laterality: N/A;  . GASTRIC BYPASS  [address]2004   [place], [place]  . TONSILLECTOMY     Childhood  . UPPER GI ENDOSCOPY  2004   Dr.Borhanian     There were no vitals filed for this visit.  Subjective Assessment - 12/28/16 0854    Subjective  Pt states she is feeling good today.  Denies pain currently.  I still feel stiff and hard to get moving after sitting for a long time, as long as I am moving I am okay. I still feel a little twinge in my glutes when I bend or lift.    Pertinent History  migraines unable to use tylenol or motrin    Patient Stated Goals  learn exercises to save the back during work.      Currently in  Pain?  No/denies                      [facility] Adult PT Treatment/Exercise - 12/28/16 0001      Neuro Re-ed    Neuro Re-ed Details   Transverse abdominal bracing with movements      Lumbar Exercises: Standing   Row  Strengthening;Power tower;Both;20 reps 15 lb - regular and low row 20x each way    Other Standing Lumbar Exercises  standing rotation     Other Standing Lumbar Exercises  half kneel on foam mat - red ball lifts 10x each side      Lumbar Exercises: Supine   Other Supine Lumbar Exercises  lying on foam roller verticle along length of spine - serratus punches, horizontal abduction, diagonals with yellow band - 10x each way      Lumbar Exercises: Sidelying   Other Sidelying Lumbar Exercises  open book stretch 5 sec hold - 5x each side      Knee/Hip Exercises: Aerobic   Nustep  L2 x 6 min                  PT Long Term Goals - 12/21/16 [id number]  PT LONG TERM GOAL #1   Title  Pt will be independent with final HEP to prevent back pain from progressing at work    Time  8    Period  Weeks    Status  On-going      PT LONG TERM GOAL #2   Title  Pt will demonstrate painfree lumbar AROM    Time  8    Period  Weeks    Status  On-going      PT LONG TERM GOAL #3   Title  Pt will report decrease in end of the day stiffness of 50% or more    Time  8    Period  Weeks    Status  On-going            Plan - 12/28/16 0919    Clinical Impression Statement  Pt states this is the first week that she didn't notice pain during the work week.  Pt is able to progress core exercises today.  She needs cues for pelvic position in standing but able to stabilize using TrA contraction after bringing attention to these muscles.  Pt will continue to benefit from improved core strengthening for improved functional lifting.      Rehab Potential  Excellent    PT Treatment/Interventions  ADLs/Self Care Home Management;Electrical Stimulation;Moist  Heat;Traction;Therapeutic exercise;Manual techniques;Patient/family education;Neuromuscular re-education;Dry needling;Taping    PT Next Visit Plan  progress strength of core and continue to work on lumbar and thoracic ROM, functional lifting    Consulted and Agree with Plan of Care  Patient       Patient will benefit from skilled therapeutic intervention in order to improve the following deficits and impairments:  Decreased activity tolerance, Decreased strength, Pain  Visit Diagnosis: Chronic midline low back pain without sciatica     Problem List Patient Active Problem List   Diagnosis Date Noted  . Type 2 diabetes mellitus without complication, without long-term current use of insulin (HCC) 11/02/2016  . Hyperlipidemia 11/02/2016  . Hypothyroidism due to acquired atrophy of thyroid 11/02/2016  . Iron deficiency anemia 09/14/2016  . Malabsorption of iron 09/10/2013  . H/O gastric bypass 09/10/2013  . Postoperative malabsorption 07/29/2013  . Encounter for screening colonoscopy 07/29/2013  . Anemia 07/29/2013    [name], PT 12/28/2016, 9:30 AM  [facility] [address] [place], [place], [address] Phone: [phone]   Fax:  [phone]  Name: April [name] MRN: [id number] Date of Birth: 12/02/1955

## 2017-01-04 ENCOUNTER — Ambulatory Visit: Payer: BLUE CROSS/BLUE SHIELD | Admitting: Physical Therapy

## 2017-01-04 ENCOUNTER — Encounter: Payer: Self-pay | Admitting: Physical Therapy

## 2017-01-04 DIAGNOSIS — G8929 Other chronic pain: Secondary | ICD-10-CM

## 2017-01-04 DIAGNOSIS — M545 Low back pain: Principal | ICD-10-CM

## 2017-01-04 NOTE — Therapy (Addendum)
[facility] [address] [place], [place], [address] Phone: [phone]   Fax:  [phone]  Physical Therapy Treatment  Patient Details  Name: April [name] MRN: [id number] Date of Birth: 08/23/1955 Referring Provider: Sherrie Mustache NP   Encounter Date: 01/04/2017  PT End of Session - 01/04/17 0946    Visit Number  5    Date for PT Re-Evaluation  02/01/17    Authorization Type  BCBS    PT Start Time  0903 came late    PT Stop Time  0947    PT Time Calculation (min)  44 min    Activity Tolerance  Patient tolerated treatment well    Behavior During Therapy  [facility] for tasks assessed/performed       Past Medical History:  Diagnosis Date  . Anemia   . Chronic back pain   . Hypothyroidism     Past Surgical History:  Procedure Laterality Date  . CESAREAN SECTION  [id number]  . COLONOSCOPY N/A 08/07/2013   Procedure: COLONOSCOPY;  Surgeon: Danie Binder, MD;  [facility];  Service: Endoscopy;  Laterality: N/A;  10:30-moved to [place] notified pt  . ESOPHAGOGASTRODUODENOSCOPY    . ESOPHAGOGASTRODUODENOSCOPY N/A 08/07/2013   Procedure: ESOPHAGOGASTRODUODENOSCOPY (EGD);  Surgeon: Danie Binder, MD;  [facility];  Service: Endoscopy;  Laterality: N/A;  . GASTRIC BYPASS  [address] [place]  . TONSILLECTOMY     Childhood  . UPPER GI ENDOSCOPY  2004   Dr.Borhanian     There were no vitals filed for this visit.  Subjective Assessment - 01/04/17 0905    Subjective  Patient reports her back pain is 70% better. 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 I had a twinge today.     Pertinent History  migraines unable to use tylenol or motrin    How long can you sit comfortably?  no limits    How long can you stand comfortably?  no limits    How long can you walk comfortably?  no limits    Diagnostic tests  none    Patient Stated Goals  learn exercises to save the  back during work.      Currently in Pain?  No/denies                      [facility] Adult PT Treatment/Exercise - 01/04/17 0001      Therapeutic Activites    Therapeutic Activities  Other Therapeutic Activities    Other Therapeutic Activities  how to stretch during work and stretch opposite the static position she was in with patient      Lumbar Exercises: Stretches   Prone on Elbows Stretch  5 reps;10 seconds VC to keep shoulders down    Quadruped Mid Back Stretch  30 seconds;2 reps pillow between knee and thigh; also side to side      Lumbar Exercises: Supine   Other Supine Lumbar Exercises  lying on foam roller verticle along length of spine - serratus punches, horizontal abduction, diagonals with red band - 10x each way; alternate shoulder flexion 20x; alternate hip flexion 20x; alternate hip and shoulder flexion 20x      Lumbar Exercises: Sidelying   Other Sidelying Lumbar Exercises  open book stretch 5 sec hold - 5x each side      Lumbar Exercises: Quadruped   Opposite Arm/Leg Raise  Right arm/Left leg;Left  arm/Right leg;5 reps VC to engage abdominals      Knee/Hip Exercises: Aerobic   Nustep  L2 x 6 min ; arms #10 while assessing patient             PT Education - 01/04/17 0944    Education provided  Yes    Education Details  went over technique on how to perform HEP correctly and engage her core.     Person(s) Educated  Patient    Methods  Explanation;Demonstration;Verbal cues;Handout    Comprehension  Verbalized understanding;Returned demonstration          PT Long Term Goals - 01/04/17 8657      PT LONG TERM GOAL #1   Title  Pt will be independent with final HEP to prevent back pain from progressing at work    Baseline  still learning    Time  8    Period  Weeks    Status  On-going      PT LONG TERM GOAL #2   Title  Pt will demonstrate painfree lumbar AROM    Time  8    Period  Weeks    Status  On-going      PT LONG TERM GOAL #3    Title  Pt will report decrease in end of the day stiffness of 50% or more    Baseline  still stiff    Time  8    Period  Weeks    Status  On-going            Plan - 01/04/17 0948    Clinical Impression Statement  Patient reports her pain si 70% better.  She feels her ergonomics at work is not good and understands ways to move in opposite direction to stretch. Patient has weakness in core with exercise due to not being able to keep her hips steady. Pt and patient discussed on ways to keep with an exercise program.  Patient will benefit from skilled therapy to improve core strength for improved functional lifting.     Rehab Potential  Excellent    PT Frequency  1x / week    PT Duration  8 weeks    PT Treatment/Interventions  ADLs/Self Care Home Management;Electrical Stimulation;Moist Heat;Traction;Therapeutic exercise;Manual techniques;Patient/family education;Neuromuscular re-education;Dry needling;Taping    PT Next Visit Plan  continue with HEP for core and working on technique    Recommended Other Services  MD signed initial eval    Consulted and Agree with Plan of Care  Patient       Patient will benefit from skilled therapeutic intervention in order to improve the following deficits and impairments:  Decreased activity tolerance, Decreased strength, Pain  Visit Diagnosis: Chronic midline low back pain without sciatica     Problem List Patient Active Problem List   Diagnosis Date Noted  . Type 2 diabetes mellitus without complication, without long-term current use of insulin ([place]) 11/02/2016  . Hyperlipidemia 11/02/2016  . Hypothyroidism due to acquired atrophy of thyroid 11/02/2016  . Iron deficiency anemia 09/14/2016  . Malabsorption of iron 09/10/2013  . H/O gastric bypass 09/10/2013  . Postoperative malabsorption 07/29/2013  . Encounter for screening colonoscopy 07/29/2013  . Anemia 07/29/2013    Earlie Counts, PT 01/04/17 9:52 AM   [facility] [address] [place], [place], [address] Phone: [phone]   Fax:  [phone]  Name: April [name] MRN: [id number] Date of Birth: [birth year]  PHYSICAL THERAPY DISCHARGE SUMMARY  Visits from Start of Care: 5  Current functional level related to goals / functional outcomes: See above goals   Remaining deficits: See above details   Education / Equipment: HEP  Plan: Patient agrees to discharge.  Patient goals were not met. Patient is being discharged due to not returning since the last visit.  ?????     [employer], PT 02/12/17 2:33 PM

## 2017-01-11 ENCOUNTER — Encounter: Payer: BLUE CROSS/BLUE SHIELD | Admitting: Physical Therapy

## 2017-01-18 ENCOUNTER — Encounter: Payer: BLUE CROSS/BLUE SHIELD | Admitting: Physical Therapy

## 2017-01-25 ENCOUNTER — Encounter: Payer: BLUE CROSS/BLUE SHIELD | Admitting: Physical Therapy

## 2017-02-01 ENCOUNTER — Ambulatory Visit: Payer: Self-pay | Admitting: Physical Therapy

## 2017-02-12 ENCOUNTER — Telehealth: Payer: Self-pay | Admitting: Physical Therapy

## 2017-02-12 NOTE — Telephone Encounter (Signed)
Informed patient of discharge from PT at this time due to expired POC  [facility], PT 02/12/17 2:29 PM

## 2017-02-13 DIAGNOSIS — M542 Cervicalgia: Secondary | ICD-10-CM | POA: Diagnosis not present

## 2017-02-13 DIAGNOSIS — G518 Other disorders of facial nerve: Secondary | ICD-10-CM | POA: Diagnosis not present

## 2017-02-13 DIAGNOSIS — G43719 Chronic migraine without aura, intractable, without status migrainosus: Secondary | ICD-10-CM | POA: Diagnosis not present

## 2017-02-13 DIAGNOSIS — M791 Myalgia, unspecified site: Secondary | ICD-10-CM | POA: Diagnosis not present

## 2017-02-19 ENCOUNTER — Other Ambulatory Visit: Payer: Self-pay | Admitting: Internal Medicine

## 2017-02-19 DIAGNOSIS — Z1231 Encounter for screening mammogram for malignant neoplasm of breast: Secondary | ICD-10-CM

## 2017-02-20 ENCOUNTER — Telehealth: Payer: Self-pay

## 2017-02-20 ENCOUNTER — Ambulatory Visit: Payer: Self-pay | Admitting: Nurse Practitioner

## 2017-02-20 MED ORDER — OSELTAMIVIR PHOSPHATE 75 MG PO CAPS: 75.0000 mg | ORAL_CAPSULE | Freq: Two times a day (BID) | ORAL | 0 refills | Status: DC

## 2017-02-20 MED ORDER — BENZONATATE 100 MG PO CAPS: 100.0000 mg | ORAL_CAPSULE | Freq: Three times a day (TID) | ORAL | 0 refills | Status: DC | PRN

## 2017-02-20 NOTE — Telephone Encounter (Signed)
I spoke with patient at Jessica's request to see what symptoms patient was having and to see if she has had direct contact with daughter who was diagnosed with the flu. Patient's described symptoms as head/chest congestion, body aches, headache, chills, and cough. Pt has had direct contact with daughter.   Per [name], medication to treat flu and cough to be sent to pharmacy. Patient notified and agreed.

## 2017-02-20 NOTE — Telephone Encounter (Signed)
Rx sent to pharmacy on file, please let her know if symptoms worsen, she has increase shortness of breath ongoing fever, etc to seek medical attention due to complications from flu that can occur

## 2017-02-20 NOTE — Telephone Encounter (Signed)
Patient notified and agreed.  

## 2017-03-15 ENCOUNTER — Other Ambulatory Visit (HOSPITAL_COMMUNITY): Payer: BLUE CROSS/BLUE SHIELD

## 2017-03-21 ENCOUNTER — Other Ambulatory Visit (HOSPITAL_COMMUNITY): Payer: Self-pay

## 2017-03-21 DIAGNOSIS — D508 Other iron deficiency anemias: Secondary | ICD-10-CM

## 2017-03-21 DIAGNOSIS — Z9884 Bariatric surgery status: Secondary | ICD-10-CM

## 2017-03-22 ENCOUNTER — Inpatient Hospital Stay (HOSPITAL_COMMUNITY): Payer: BLUE CROSS/BLUE SHIELD | Attending: Internal Medicine

## 2017-03-22 DIAGNOSIS — D509 Iron deficiency anemia, unspecified: Secondary | ICD-10-CM | POA: Insufficient documentation

## 2017-03-22 DIAGNOSIS — E538 Deficiency of other specified B group vitamins: Secondary | ICD-10-CM | POA: Diagnosis not present

## 2017-03-22 DIAGNOSIS — Z9884 Bariatric surgery status: Secondary | ICD-10-CM

## 2017-03-22 DIAGNOSIS — D508 Other iron deficiency anemias: Secondary | ICD-10-CM

## 2017-03-22 LAB — COMPREHENSIVE METABOLIC PANEL
ALK PHOS: 87 U/L (ref 38–126)
ALT: 11 U/L — ABNORMAL LOW (ref 14–54)
ANION GAP: 9 (ref 5–15)
AST: 15 U/L (ref 15–41)
Albumin: 3.5 g/dL (ref 3.5–5.0)
BILIRUBIN TOTAL: 0.6 mg/dL (ref 0.3–1.2)
BUN: 13 mg/dL (ref 6–20)
CALCIUM: 8.6 mg/dL — AB (ref 8.9–10.3)
CO2: 23 mmol/L (ref 22–32)
Chloride: 109 mmol/L (ref 101–111)
Creatinine, Ser: 0.83 mg/dL (ref 0.44–1.00)
GFR calc non Af Amer: >60 mL/min (ref >60)
Glucose, Bld: 93 mg/dL (ref 65–99)
POTASSIUM: 3.7 mmol/L (ref 3.5–5.1)
SODIUM: 141 mmol/L (ref 135–145)
TOTAL PROTEIN: 6.1 g/dL — AB (ref 6.5–8.1)

## 2017-03-22 LAB — CBC WITH DIFFERENTIAL/PLATELET
Basophils Absolute: 0 10*3/uL (ref 0.0–0.1)
Basophils Relative: 1 %
EOS PCT: 2 %
Eosinophils Absolute: 0.1 10*3/uL (ref 0.0–0.7)
HCT: 35.6 % — ABNORMAL LOW (ref 36.0–46.0)
HEMOGLOBIN: 11.5 g/dL — AB (ref 12.0–15.0)
LYMPHS ABS: 1.2 10*3/uL (ref 0.7–4.0)
Lymphocytes Relative: 31 %
MCH: 32.2 pg (ref 26.0–34.0)
MCHC: 32.3 g/dL (ref 30.0–36.0)
MCV: 99.7 fL (ref 78.0–100.0)
MONO ABS: 0.3 10*3/uL (ref 0.1–1.0)
MONOS PCT: 8 %
NEUTROS PCT: 58 %
Neutro Abs: 2.3 10*3/uL (ref 1.7–7.7)
Platelets: 268 10*3/uL (ref 150–400)
RBC: 3.57 MIL/uL — ABNORMAL LOW (ref 3.87–5.11)
RDW: 12.6 % (ref 11.5–15.5)
WBC: 3.9 10*3/uL — ABNORMAL LOW (ref 4.0–10.5)

## 2017-03-22 LAB — FERRITIN: Ferritin: 167 ng/mL (ref 11–307)

## 2017-03-22 LAB — VITAMIN B12: Vitamin B-12: 223 pg/mL (ref 180–914)

## 2017-03-22 LAB — IRON AND TIBC
Iron: 82 ug/dL (ref 28–170)
SATURATION RATIOS: 34 % — AB (ref 10.4–31.8)
TIBC: 241 ug/dL — AB (ref 250–450)
UIBC: 159 ug/dL

## 2017-03-28 ENCOUNTER — Encounter (HOSPITAL_COMMUNITY): Payer: Self-pay | Admitting: Oncology

## 2017-04-02 ENCOUNTER — Other Ambulatory Visit: Payer: Self-pay | Admitting: Nurse Practitioner

## 2017-04-05 ENCOUNTER — Ambulatory Visit (HOSPITAL_COMMUNITY): Payer: Self-pay

## 2017-04-12 ENCOUNTER — Ambulatory Visit (HOSPITAL_COMMUNITY)
Admission: RE | Admit: 2017-04-12 | Discharge: 2017-04-12 | Disposition: A | Discharge status: Home / Self Care | Payer: BLUE CROSS/BLUE SHIELD | Source: Ambulatory Visit | Attending: Internal Medicine | Admitting: Internal Medicine

## 2017-04-12 DIAGNOSIS — Z1231 Encounter for screening mammogram for malignant neoplasm of breast: Secondary | ICD-10-CM | POA: Diagnosis not present

## 2017-06-12 ENCOUNTER — Other Ambulatory Visit: Payer: Self-pay | Admitting: Nurse Practitioner

## 2017-06-13 ENCOUNTER — Other Ambulatory Visit (HOSPITAL_COMMUNITY): Payer: Self-pay

## 2017-06-13 DIAGNOSIS — K912 Postsurgical malabsorption, not elsewhere classified: Secondary | ICD-10-CM

## 2017-06-13 DIAGNOSIS — E559 Vitamin D deficiency, unspecified: Secondary | ICD-10-CM

## 2017-06-13 DIAGNOSIS — D500 Iron deficiency anemia secondary to blood loss (chronic): Secondary | ICD-10-CM

## 2017-06-13 DIAGNOSIS — K909 Intestinal malabsorption, unspecified: Secondary | ICD-10-CM

## 2017-06-13 DIAGNOSIS — D508 Other iron deficiency anemias: Secondary | ICD-10-CM

## 2017-06-13 DIAGNOSIS — Z9884 Bariatric surgery status: Secondary | ICD-10-CM

## 2017-06-14 ENCOUNTER — Encounter (HOSPITAL_COMMUNITY): Payer: Self-pay | Admitting: Hematology

## 2017-06-14 ENCOUNTER — Other Ambulatory Visit: Payer: Self-pay

## 2017-06-14 ENCOUNTER — Inpatient Hospital Stay (HOSPITAL_COMMUNITY): Payer: BLUE CROSS/BLUE SHIELD | Attending: Hematology | Admitting: Hematology

## 2017-06-14 ENCOUNTER — Inpatient Hospital Stay (HOSPITAL_COMMUNITY): Payer: BLUE CROSS/BLUE SHIELD

## 2017-06-14 DIAGNOSIS — K912 Postsurgical malabsorption, not elsewhere classified: Secondary | ICD-10-CM

## 2017-06-14 DIAGNOSIS — D500 Iron deficiency anemia secondary to blood loss (chronic): Secondary | ICD-10-CM

## 2017-06-14 DIAGNOSIS — K909 Intestinal malabsorption, unspecified: Secondary | ICD-10-CM

## 2017-06-14 DIAGNOSIS — E559 Vitamin D deficiency, unspecified: Secondary | ICD-10-CM | POA: Diagnosis not present

## 2017-06-14 DIAGNOSIS — E538 Deficiency of other specified B group vitamins: Secondary | ICD-10-CM

## 2017-06-14 DIAGNOSIS — D509 Iron deficiency anemia, unspecified: Secondary | ICD-10-CM | POA: Diagnosis not present

## 2017-06-14 DIAGNOSIS — D508 Other iron deficiency anemias: Secondary | ICD-10-CM

## 2017-06-14 DIAGNOSIS — Z9884 Bariatric surgery status: Secondary | ICD-10-CM

## 2017-06-14 LAB — CBC WITH DIFFERENTIAL/PLATELET
BASOS PCT: 0 %
Basophils Absolute: 0 10*3/uL (ref 0.0–0.1)
Eosinophils Absolute: 0.1 10*3/uL (ref 0.0–0.7)
Eosinophils Relative: 4 %
HCT: 36.7 % (ref 36.0–46.0)
HEMOGLOBIN: 11.9 g/dL — AB (ref 12.0–15.0)
LYMPHS PCT: 40 %
Lymphs Abs: 1.2 10*3/uL (ref 0.7–4.0)
MCH: 33 pg (ref 26.0–34.0)
MCHC: 32.4 g/dL (ref 30.0–36.0)
MCV: 101.7 fL — AB (ref 78.0–100.0)
MONO ABS: 0.3 10*3/uL (ref 0.1–1.0)
MONOS PCT: 10 %
NEUTROS ABS: 1.4 10*3/uL — AB (ref 1.7–7.7)
NEUTROS PCT: 46 %
Platelets: 271 10*3/uL (ref 150–400)
RBC: 3.61 MIL/uL — ABNORMAL LOW (ref 3.87–5.11)
RDW: 12.7 % (ref 11.5–15.5)
WBC: 3 10*3/uL — ABNORMAL LOW (ref 4.0–10.5)

## 2017-06-14 LAB — COMPREHENSIVE METABOLIC PANEL
ALBUMIN: 3.5 g/dL (ref 3.5–5.0)
ALK PHOS: 96 U/L (ref 38–126)
ALT: 18 U/L (ref 14–54)
AST: 20 U/L (ref 15–41)
Anion gap: 5 (ref 5–15)
BILIRUBIN TOTAL: 0.5 mg/dL (ref 0.3–1.2)
BUN: 11 mg/dL (ref 6–20)
CO2: 24 mmol/L (ref 22–32)
Calcium: 8.6 mg/dL — ABNORMAL LOW (ref 8.9–10.3)
Chloride: 110 mmol/L (ref 101–111)
Creatinine, Ser: 0.9 mg/dL (ref 0.44–1.00)
GFR calc Af Amer: >60 mL/min (ref >60)
GLUCOSE: 258 mg/dL — AB (ref 65–99)
Potassium: 3.2 mmol/L — ABNORMAL LOW (ref 3.5–5.1)
Sodium: 139 mmol/L (ref 135–145)
TOTAL PROTEIN: 6.1 g/dL — AB (ref 6.5–8.1)

## 2017-06-14 LAB — VITAMIN B12: VITAMIN B 12: 278 pg/mL (ref 180–914)

## 2017-06-14 MED ORDER — CYANOCOBALAMIN 1000 MCG/ML IJ SOLN: 1000.0000 ug | INTRAMUSCULAR | 12 refills | Status: DC

## 2017-06-14 NOTE — Progress Notes (Signed)
[facility] [address], [place] [address]   CLINIC:  Medical Oncology/Hematology  PCP:  [name], NP [address] [place]. [place] [address] [phone]   REASON FOR VISIT:  Follow-up for iron and B12 deficiency.  CURRENT THERAPY: Intermittent Feraheme and monthly B12 injections.   INTERVAL HISTORY:  April [name] 62 y.o. female returns for follow-up of her iron deficiency and B12 deficiency.  She reports feeling very tired every evening after work.  Denies any bleeding or melena.  She is self injecting B12 injection every 4 weeks.  Last Feraheme infusion was in October 2018.  Denies any fevers, night sweats or weight loss.  Denies any recent hospitalizations or ER visits.  She works as a Education officer, community in town.  Energy levels are reported as 50%.    REVIEW OF SYSTEMS:  Review of Systems  Constitutional: Positive for fatigue.  All other systems reviewed and are negative.    PAST MEDICAL/SURGICAL HISTORY:  Past Medical History:  Diagnosis Date  . Anemia   . Chronic back pain   . Hypothyroidism    Past Surgical History:  Procedure Laterality Date  . CESAREAN SECTION  [id number]  . COLONOSCOPY N/A 08/07/2013   Procedure: COLONOSCOPY;  Surgeon: West Bali, MD;  [facility];  Service: Endoscopy;  Laterality: N/A;  10:30-moved to [address] notified pt  . ESOPHAGOGASTRODUODENOSCOPY    . ESOPHAGOGASTRODUODENOSCOPY N/A 08/07/2013   Procedure: ESOPHAGOGASTRODUODENOSCOPY (EGD);  Surgeon: West Bali, MD;  [facility];  Service: Endoscopy;  Laterality: N/A;  . GASTRIC BYPASS  [address], [place]  . TONSILLECTOMY     Childhood  . UPPER GI ENDOSCOPY  2004   Dr.Borhanian      SOCIAL HISTORY:  Social History   Socioeconomic History  . Marital status: Married    Spouse name: Not on file  . Number of children: Not on file  . Years of education: Not on file  . Highest education level: Not on file  Occupational  History  . Occupation: Environmental health practitioner: IT trainer  Social Needs  . Financial resource strain: Not on file  . Food insecurity:    Worry: Not on file    Inability: Not on file  . Transportation needs:    Medical: Not on file    Non-medical: Not on file  Tobacco Use  . Smoking status: Current Every Day Smoker    Packs/day: 0.50    Years: 10.00    Pack years: 5.00    Types: Cigarettes  . Smokeless tobacco: Never Used  . Tobacco comment: Relasped, 4 cig daily   Substance and Sexual Activity  . Alcohol use: No    Alcohol/week: 0.0 oz  . Drug use: No  . Sexual activity: Yes  Lifestyle  . Physical activity:    Days per week: Not on file    Minutes per session: Not on file  . Stress: Not on file  Relationships  . Social connections:    Talks on phone: Not on file    Gets together: Not on file    Attends religious service: Not on file    Active member of club or organization: Not on file    Attends meetings of clubs or organizations: Not on file    Relationship status: Not on file  . Intimate partner violence:    Fear of current or ex partner: Not on file    Emotionally  abused: Not on file    Physically abused: Not on file    Forced sexual activity: Not on file  Other Topics Concern  . Not on file  Social History Narrative   Married, 1990, has 2 children. Patient lives in a multi-level home. Previously smoked 1/2 PPD, smoked for 10-20 years. Drinks a minimal amount of caffeinated beverages.     FAMILY HISTORY:  Family History  Problem Relation Age of Onset  . Heart disease Mother   . Diabetes Mother   . Stroke Mother   . Hypertension Mother   . Diabetes Father   . Heart disease Father   . Hypertension Father   . Migraines Daughter   . Colon cancer Neg Hx     CURRENT MEDICATIONS:  Outpatient Encounter Medications as of 06/14/2017  Medication Sig  . acetaminophen (TYLENOL) 500 MG tablet Take 500 mg by mouth every 6 (six) hours as needed for mild pain or  headache.  . cyanocobalamin (,VITAMIN B-12,) 1000 MCG/ML injection Inject 1 mL (1,000 mcg total) into the muscle every 21 ( twenty-one) days.  Marland Kitchen escitalopram (LEXAPRO) 10 MG tablet TAKE ONE TABLET BY MOUTH ONCE DAILY.  Marland Kitchen levothyroxine (SYNTHROID, LEVOTHROID) 50 MCG tablet TAKE ONE TABLET BY MOUTH ONCE DAILY.  Marland Kitchen Vitamin D, Ergocalciferol, (DRISDOL) 50000 units CAPS capsule Take 1 capsule (50,000 Units total) by mouth once a week.  . zonisamide (ZONEGRAN) 100 MG capsule Take 200 mg by mouth daily.   . [DISCONTINUED] cyanocobalamin (,VITAMIN B-12,) 1000 MCG/ML injection Inject 1 mL (1,000 mcg total) into the muscle every 30 (thirty) days.  . [DISCONTINUED] benzonatate (TESSALON) 100 MG capsule Take 1 capsule (100 mg total) by mouth 3 (three) times daily as needed for cough.  . [DISCONTINUED] oseltamivir (TAMIFLU) 75 MG capsule Take 1 capsule (75 mg total) by mouth 2 (two) times daily.  . [DISCONTINUED] zonisamide (ZONEGRAN) 25 MG capsule Take 150 mg by mouth at bedtime.    No facility-administered encounter medications on file as of 06/14/2017.     ALLERGIES:  No Known Allergies   PHYSICAL EXAM:  ECOG Performance status: 0  Vitals:   06/14/17 1525  BP: 120/68  Pulse: 67  Resp: 16  Temp: 98.6 F (37 C)  SpO2: 100%   Filed Weights   06/14/17 1525  Weight: 205 lb 9.6 oz (93.3 kg)    Physical Exam Deferred.  LABORATORY DATA:  I have reviewed the labs as listed.  CBC    Component Value Date/Time   WBC 3.0 (L) 06/14/2017 1422   RBC 3.61 (L) 06/14/2017 1422   HGB 11.9 (L) 06/14/2017 1422   HCT 36.7 06/14/2017 1422   PLT 271 06/14/2017 1422   MCV 101.7 (H) 06/14/2017 1422   MCH 33.0 06/14/2017 1422   MCHC 32.4 06/14/2017 1422   RDW 12.7 06/14/2017 1422   LYMPHSABS 1.2 06/14/2017 1422   MONOABS 0.3 06/14/2017 1422   EOSABS 0.1 06/14/2017 1422   BASOSABS 0.0 06/14/2017 1422   CMP Latest Ref Rng & Units 06/14/2017 03/22/2017 12/14/2016  Glucose 65 - 99 mg/dL 161(W) 93 960(A)    BUN 6 - 20 mg/dL 11 13 10   Creatinine 0.44 - 1.00 mg/dL 5.40 9.81 1.91  Sodium 135 - 145 mmol/L 139 141 138  Potassium 3.5 - 5.1 mmol/L 3.2(L) 3.7 3.7  Chloride 101 - 111 mmol/L 110 109 109  CO2 22 - 32 mmol/L 24 23 25   Calcium 8.9 - 10.3 mg/dL 4.7(W) 2.9(F) 8.9  Total Protein 6.5 - 8.1  g/dL 6.1(L) 6.1(L) 6.5  Total Bilirubin 0.3 - 1.2 mg/dL 0.5 0.6 0.6  Alkaline Phos 38 - 126 U/L 96 87 96  AST 15 - 41 U/L 20 15 16   ALT 14 - 54 U/L 18 11(L) 15        ASSESSMENT & PLAN:   Iron deficiency anemia 1. Iron deficiency anemia: - Secondary to malabsorption from gastric bypass surgery.  She is on intermittent Feraheme infusions, last one on 09/28/2016 and 10/12/2016. - Colonoscopy on 08/07/2013 with a large redundant colon and internal/external hemorrhoids. -EGD on 08/07/2013 with a Schatzki ring at GE junction and a small clean-based ulcer at anastomosis. - She is complaining of getting tired by the end of the day.  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 -Hemoglobin today is 11.9.  Ferritin 3 months ago was 167 but downtrending.  Hence I have recommended one infusion of Feraheme next week.  We will recheck her blood counts in 3 months.  2.  B12 deficiency: -Secondary to malabsorption from gastric bypass surgery.  She is currently taking B12 injection at home once every 4 weeks.  Her last B12 level was low at 223.  Hence I have recommended increasing the B12 injection to every 3 weeks.  Her MCV has also gone up to 101.7.  3.  Vitamin D deficiency: - Her last vitamin D level was 31 in 2018.  She is taking 50,000 units weekly.  I plan to check another level in 3 months.      Orders placed this encounter:  Orders Placed This Encounter  Procedures  . CBC with Differential  . Vitamin B12  . Folate  . Iron and TIBC  . Ferritin  . Vitamin D 25 hydroxy      Doreatha MassedSreedhar Tudor Chandley, MD [facility] [phone]

## 2017-06-14 NOTE — Assessment & Plan Note (Signed)
1. Iron deficiency anemia: - Secondary to malabsorption from gastric bypass surgery.  She is on intermittent Feraheme infusions, last one on 09/28/2016 and 10/12/2016. - Colonoscopy on 08/07/2013 with a large redundant colon and internal/external hemorrhoids. -EGD on 08/07/2013 with a Schatzki ring at GE junction and a small clean-based ulcer at anastomosis. - She is complaining of getting tired by the end of the day.  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 -Hemoglobin today is 11.9.  Ferritin 3 months ago was 167 but downtrending.  Hence I have recommended one infusion of Feraheme next week.  We will recheck her blood counts in 3 months.  2.  B12 deficiency: -Secondary to malabsorption from gastric bypass surgery.  She is currently taking B12 injection at home once every 4 weeks.  Her last B12 level was low at 223.  Hence I have recommended increasing the B12 injection to every 3 weeks.  Her MCV has also gone up to 101.7.  3.  Vitamin D deficiency: - Her last vitamin D level was 31 in 2018.  She is taking 50,000 units weekly.  I plan to check another level in 3 months.

## 2017-06-21 ENCOUNTER — Encounter (HOSPITAL_COMMUNITY): Payer: Self-pay

## 2017-06-21 ENCOUNTER — Ambulatory Visit (HOSPITAL_COMMUNITY): Payer: BLUE CROSS/BLUE SHIELD

## 2017-06-21 ENCOUNTER — Inpatient Hospital Stay (HOSPITAL_COMMUNITY): Payer: BLUE CROSS/BLUE SHIELD

## 2017-06-21 DIAGNOSIS — Z9884 Bariatric surgery status: Secondary | ICD-10-CM

## 2017-06-21 DIAGNOSIS — D508 Other iron deficiency anemias: Secondary | ICD-10-CM

## 2017-06-21 DIAGNOSIS — D509 Iron deficiency anemia, unspecified: Secondary | ICD-10-CM | POA: Diagnosis not present

## 2017-06-21 DIAGNOSIS — E559 Vitamin D deficiency, unspecified: Secondary | ICD-10-CM | POA: Diagnosis not present

## 2017-06-21 DIAGNOSIS — E538 Deficiency of other specified B group vitamins: Secondary | ICD-10-CM | POA: Diagnosis not present

## 2017-06-21 DIAGNOSIS — K912 Postsurgical malabsorption, not elsewhere classified: Secondary | ICD-10-CM

## 2017-06-21 MED ORDER — SODIUM CHLORIDE 0.9 % IV SOLN
INTRAVENOUS | Status: DC
  Administered 2017-06-21: 11:00:00 via INTRAVENOUS

## 2017-06-21 MED ORDER — SODIUM CHLORIDE 0.9 % IV SOLN
510.0000 mg | Freq: Once | INTRAVENOUS | Status: AC
  Administered 2017-06-21: 510 mg via INTRAVENOUS
  Filled 2017-06-21: qty 17

## 2017-06-21 NOTE — Progress Notes (Signed)
April [name] tolerated Feraheme infusion well without complaints or incident. Positive blood return noted at peripheral IV site prior to and after Feraheme infusion. VSS upon discharge. Pt discharged self ambulatory in satisfactory condition

## 2017-06-21 NOTE — Patient Instructions (Signed)
[facility] at [facility] Discharge Instructions  Received Feraheme infusion today. Follow-up as scheduled. Call clinic for any questions or concerns   Thank you for choosing [facility] at [facility] to provide your oncology and hematology care.  To afford each patient quality time with our provider, please arrive at least 15 minutes before your scheduled appointment time.   If you have a lab appointment with the Cancer Center please come in thru the  Main Entrance and check in at the main information desk  You need to re-schedule your appointment should you arrive 10 or more minutes late.  We strive to give you quality time with our providers, and arriving late affects you and other patients whose appointments are after yours.  Also, if you no show three or more times for appointments you may be dismissed from the clinic at the providers discretion.     Again, thank you for choosing [facility].  Our hope is that these requests will decrease the amount of time that you wait before being seen by our physicians.       _____________________________________________________________  Should you have questions after your visit to  Cancer Center, please contact our office at [phone] between the hours of 8:30 a.m. and 4:30 p.m.  Voicemails left after 4:30 p.m. will not be returned until the following business day.  For prescription refill requests, have your pharmacy contact our office.       Resources For Cancer Patients and their Caregivers ? American Cancer Society: Can assist with transportation, wigs, general needs, runs Look Good Feel Better.        1-888-227-6333 ? Cancer Care: Provides financial assistance, online support groups, medication/co-pay assistance.  1-800-813-HOPE (4673) ? [facility] Assists [place] cancer patients and their families through emotional , educational and  financial support.  336-427-4357 ? [place] DSS Where to apply for food stamps, Medicaid and utility assistance. 336-342-1394 ? [facility]: Transportation to medical appointments. 336-347-2287 ? Social Security Administration: May apply for disability if have a Stage IV cancer. 336-342-7796 1-800-772-1213 ? [place] Aging, Disability and Transit Services: Assists with nutrition, care and transit needs. 336-349-2343  Cancer Center Support Programs:   > Cancer Support Group  2nd Tuesday of the month 1pm-2pm, Journey Room   > Creative Journey  3rd Tuesday of the month 1130am-1pm, Journey Room    

## 2017-08-28 DIAGNOSIS — M542 Cervicalgia: Secondary | ICD-10-CM | POA: Diagnosis not present

## 2017-08-28 DIAGNOSIS — G518 Other disorders of facial nerve: Secondary | ICD-10-CM | POA: Diagnosis not present

## 2017-08-28 DIAGNOSIS — G43719 Chronic migraine without aura, intractable, without status migrainosus: Secondary | ICD-10-CM | POA: Diagnosis not present

## 2017-08-28 DIAGNOSIS — G43111 Migraine with aura, intractable, with status migrainosus: Secondary | ICD-10-CM | POA: Diagnosis not present

## 2017-08-28 DIAGNOSIS — M791 Myalgia, unspecified site: Secondary | ICD-10-CM | POA: Diagnosis not present

## 2017-09-10 ENCOUNTER — Other Ambulatory Visit (HOSPITAL_COMMUNITY): Payer: BLUE CROSS/BLUE SHIELD

## 2017-09-13 ENCOUNTER — Inpatient Hospital Stay (HOSPITAL_COMMUNITY): Payer: BLUE CROSS/BLUE SHIELD

## 2017-09-13 ENCOUNTER — Encounter (HOSPITAL_COMMUNITY): Payer: Self-pay | Admitting: Hematology

## 2017-09-13 ENCOUNTER — Inpatient Hospital Stay (HOSPITAL_COMMUNITY): Payer: BLUE CROSS/BLUE SHIELD | Attending: Hematology | Admitting: Hematology

## 2017-09-13 VITALS — BP 130/59 | HR 66 | Temp 98.0°F | Resp 14 | Wt 205.0 lb

## 2017-09-13 DIAGNOSIS — Z9884 Bariatric surgery status: Secondary | ICD-10-CM

## 2017-09-13 DIAGNOSIS — D508 Other iron deficiency anemias: Secondary | ICD-10-CM

## 2017-09-13 DIAGNOSIS — E538 Deficiency of other specified B group vitamins: Secondary | ICD-10-CM | POA: Diagnosis not present

## 2017-09-13 DIAGNOSIS — E559 Vitamin D deficiency, unspecified: Secondary | ICD-10-CM

## 2017-09-13 DIAGNOSIS — D509 Iron deficiency anemia, unspecified: Secondary | ICD-10-CM

## 2017-09-13 DIAGNOSIS — K909 Intestinal malabsorption, unspecified: Secondary | ICD-10-CM

## 2017-09-13 DIAGNOSIS — K912 Postsurgical malabsorption, not elsewhere classified: Secondary | ICD-10-CM

## 2017-09-13 LAB — FERRITIN: Ferritin: 166 ng/mL (ref 11–307)

## 2017-09-13 LAB — CBC WITH DIFFERENTIAL/PLATELET
Basophils Absolute: 0 10*3/uL (ref 0.0–0.1)
Basophils Relative: 0 %
EOS ABS: 0.1 10*3/uL (ref 0.0–0.7)
Eosinophils Relative: 1 %
HEMATOCRIT: 34.9 % — AB (ref 36.0–46.0)
HEMOGLOBIN: 11.3 g/dL — AB (ref 12.0–15.0)
LYMPHS ABS: 1.3 10*3/uL (ref 0.7–4.0)
Lymphocytes Relative: 29 %
MCH: 33 pg (ref 26.0–34.0)
MCHC: 32.4 g/dL (ref 30.0–36.0)
MCV: 102 fL — ABNORMAL HIGH (ref 78.0–100.0)
MONOS PCT: 7 %
Monocytes Absolute: 0.3 10*3/uL (ref 0.1–1.0)
NEUTROS ABS: 2.9 10*3/uL (ref 1.7–7.7)
NEUTROS PCT: 63 %
Platelets: 265 10*3/uL (ref 150–400)
RBC: 3.42 MIL/uL — ABNORMAL LOW (ref 3.87–5.11)
RDW: 11.9 % (ref 11.5–15.5)
WBC: 4.6 10*3/uL (ref 4.0–10.5)

## 2017-09-13 LAB — VITAMIN B12: VITAMIN B 12: 225 pg/mL (ref 180–914)

## 2017-09-13 LAB — FOLATE: Folate: 6.4 ng/mL (ref >5.9)

## 2017-09-13 LAB — IRON AND TIBC
Iron: 107 ug/dL (ref 28–170)
SATURATION RATIOS: 46 % — AB (ref 10.4–31.8)
TIBC: 235 ug/dL — ABNORMAL LOW (ref 250–450)
UIBC: 128 ug/dL

## 2017-09-13 MED ORDER — VITAMIN D (ERGOCALCIFEROL) 1.25 MG (50000 UNIT) PO CAPS: 50000.0000 [IU] | ORAL_CAPSULE | ORAL | 6 refills | Status: DC

## 2017-09-13 MED ORDER — CYANOCOBALAMIN 1000 MCG/ML IJ SOLN: 1000.0000 ug | Freq: Once | INTRAMUSCULAR | 0 refills | Status: AC

## 2017-09-13 NOTE — Patient Instructions (Signed)
[facility] Discharge Instructions  You saw Dr. Katragadda today.   Thank you for choosing [facility] at [facility] to provide your oncology and hematology care.  To afford each patient quality time with our provider, please arrive at least 15 minutes before your scheduled appointment time.   If you have a lab appointment with the Cancer Center please come in thru the  Main Entrance and check in at the main information desk  You need to re-schedule your appointment should you arrive 10 or more minutes late.  We strive to give you quality time with our providers, and arriving late affects you and other patients whose appointments are after yours.  Also, if you no show three or more times for appointments you may be dismissed from the clinic at the providers discretion.     Again, thank you for choosing [facility].  Our hope is that these requests will decrease the amount of time that you wait before being seen by our physicians.       _____________________________________________________________  Should you have questions after your visit to [facility], please contact our office at [phone] between the hours of 8:00 a.m. and 4:30 p.m.  Voicemails left after 4:00 p.m. will not be returned until the following business day.  For prescription refill requests, have your pharmacy contact our office and allow 72 hours.    Cancer Center Support Programs:   > Cancer Support Group  2nd Tuesday of the month 1pm-2pm, Journey Room    

## 2017-09-13 NOTE — Progress Notes (Signed)
[facility] [address], [place] [address]   CLINIC:  Medical Oncology/Hematology  PCP:  [name], NP [address] [place]. [place] [address] [phone]   REASON FOR VISIT:  Follow-up for iron deficiency anemia AND B12 deficiency  CURRENT THERAPY: Intermittent iron infusions AND B12 injections    INTERVAL HISTORY:  [name] 62 y.o. female returns for routine follow-up for iron and B12 deficiency anemia. Patient is doing well since her last visit she reports she has not been complaint with her B12 injection due to being so busy with work and unable to go by the pharmacy every 3 weeks to get them. She denies any numbness or tingling. Denies any new pains. Denies any headaches or vision changes. Denies any nausea, vomiting, or diarrhea. She report her appetite and energy level is 100%. And she is maintaining her weight well.      REVIEW OF SYSTEMS:  Review of Systems  All other systems reviewed and are negative.    PAST MEDICAL/SURGICAL HISTORY:  Past Medical History:  Diagnosis Date  . Anemia   . Chronic back pain   . Hypothyroidism    Past Surgical History:  Procedure Laterality Date  . CESAREAN SECTION  [id number]  . COLONOSCOPY N/A 08/07/2013   Procedure: COLONOSCOPY;  Surgeon: West Bali, MD;  [facility];  Service: Endoscopy;  Laterality: N/A;  10:30-moved to [address] notified pt  . ESOPHAGOGASTRODUODENOSCOPY    . ESOPHAGOGASTRODUODENOSCOPY N/A 08/07/2013   Procedure: ESOPHAGOGASTRODUODENOSCOPY (EGD);  Surgeon: West Bali, MD;  [facility];  Service: Endoscopy;  Laterality: N/A;  . GASTRIC BYPASS  [address], [place]  . TONSILLECTOMY     Childhood  . UPPER GI ENDOSCOPY  2004   Dr.Borhanian      SOCIAL HISTORY:  Social History   Socioeconomic History  . Marital status: Married    Spouse name: Not on file  . Number of children: Not on file  . Years of education: Not on file  .  Highest education level: Not on file  Occupational History  . Occupation: Environmental health practitioner: IT trainer  Social Needs  . Financial resource strain: Not on file  . Food insecurity:    Worry: Not on file    Inability: Not on file  . Transportation needs:    Medical: Not on file    Non-medical: Not on file  Tobacco Use  . Smoking status: Current Every Day Smoker    Packs/day: 0.50    Years: 10.00    Pack years: 5.00    Types: Cigarettes  . Smokeless tobacco: Never Used  . Tobacco comment: Relasped, 4 cig daily   Substance and Sexual Activity  . Alcohol use: No    Alcohol/week: 0.0 standard drinks  . Drug use: No  . Sexual activity: Yes  Lifestyle  . Physical activity:    Days per week: Not on file    Minutes per session: Not on file  . Stress: Not on file  Relationships  . Social connections:    Talks on phone: Not on file    Gets together: Not on file    Attends religious service: Not on file    Active member of club or organization: Not on file    Attends meetings of clubs or organizations: Not on file    Relationship status: Not on file  . Intimate partner violence:    Fear  of current or ex partner: Not on file    Emotionally abused: Not on file    Physically abused: Not on file    Forced sexual activity: Not on file  Other Topics Concern  . Not on file  Social History Narrative   Married, 1990, has 2 children. Patient lives in a multi-level home. Previously smoked 1/2 PPD, smoked for 10-20 years. Drinks a minimal amount of caffeinated beverages.     FAMILY HISTORY:  Family History  Problem Relation Age of Onset  . Heart disease Mother   . Diabetes Mother   . Stroke Mother   . Hypertension Mother   . Diabetes Father   . Heart disease Father   . Hypertension Father   . Migraines Daughter   . Colon cancer Neg Hx     CURRENT MEDICATIONS:  Outpatient Encounter Medications as of 09/13/2017  Medication Sig  . acetaminophen (TYLENOL) 500 MG tablet  Take 500 mg by mouth every 6 (six) hours as needed for mild pain or headache.  . cyanocobalamin (,VITAMIN B-12,) 1000 MCG/ML injection Inject 1 mL (1,000 mcg total) into the muscle every 21 ( twenty-one) days.  Marland Kitchen escitalopram (LEXAPRO) 10 MG tablet TAKE ONE TABLET BY MOUTH ONCE DAILY.  Marland Kitchen levothyroxine (SYNTHROID, LEVOTHROID) 50 MCG tablet TAKE ONE TABLET BY MOUTH ONCE DAILY.  Marland Kitchen Vitamin D, Ergocalciferol, (DRISDOL) 50000 units CAPS capsule Take 1 capsule (50,000 Units total) by mouth once a week.  . zonisamide (ZONEGRAN) 100 MG capsule Take 200 mg by mouth daily.   . [DISCONTINUED] Vitamin D, Ergocalciferol, (DRISDOL) 50000 units CAPS capsule Take 1 capsule (50,000 Units total) by mouth once a week.  . cyanocobalamin (,VITAMIN B-12,) 1000 MCG/ML injection Inject 1 mL (1,000 mcg total) into the skin once for 1 dose.   No facility-administered encounter medications on file as of 09/13/2017.     ALLERGIES:  No Known Allergies   PHYSICAL EXAM:  ECOG Performance status: 1  Vitals:   09/13/17 1245  BP: (!) 130/59  Pulse: 66  Resp: 14  Temp: 98 F (36.7 C)  SpO2: 100%   Filed Weights   09/13/17 1245  Weight: 205 lb (93 kg)    Physical Exam  Constitutional: She is oriented to person, place, and time. She appears well-developed and well-nourished.  Cardiovascular: Normal rate, regular rhythm and normal heart sounds.  Pulmonary/Chest: Effort normal and breath sounds normal.  Abdominal: Soft.  Musculoskeletal: Normal range of motion.  Neurological: She is alert and oriented to person, place, and time.  Skin: Skin is warm and dry.  Psychiatric: She has a normal mood and affect. Her behavior is normal. Judgment and thought content normal.     LABORATORY DATA:  I have reviewed the labs as listed.  CBC    Component Value Date/Time   WBC 4.6 09/13/2017 1003   RBC 3.42 (L) 09/13/2017 1003   HGB 11.3 (L) 09/13/2017 1003   HCT 34.9 (L) 09/13/2017 1003   PLT 265 09/13/2017 1003   MCV  102.0 (H) 09/13/2017 1003   MCH 33.0 09/13/2017 1003   MCHC 32.4 09/13/2017 1003   RDW 11.9 09/13/2017 1003   LYMPHSABS 1.3 09/13/2017 1003   MONOABS 0.3 09/13/2017 1003   EOSABS 0.1 09/13/2017 1003   BASOSABS 0.0 09/13/2017 1003   CMP Latest Ref Rng & Units 06/14/2017 03/22/2017 12/14/2016  Glucose 65 - 99 mg/dL 621(H) 93 086(V)  BUN 6 - 20 mg/dL 11 13 10   Creatinine 0.44 - 1.00 mg/dL 7.84 6.96  0.86  Sodium 135 - 145 mmol/L 139 141 138  Potassium 3.5 - 5.1 mmol/L 3.2(L) 3.7 3.7  Chloride 101 - 111 mmol/L 110 109 109  CO2 22 - 32 mmol/L 24 23 25   Calcium 8.9 - 10.3 mg/dL 7.7(C) 3.4(K) 8.9  Total Protein 6.5 - 8.1 g/dL 6.1(L) 6.1(L) 6.5  Total Bilirubin 0.3 - 1.2 mg/dL 0.5 0.6 0.6  Alkaline Phos 38 - 126 U/L 96 87 96  AST 15 - 41 U/L 20 15 16   ALT 14 - 54 U/L 18 11(L) 15         ASSESSMENT & PLAN:   Iron deficiency anemia 1. Iron deficiency anemia: - Secondary to malabsorption from gastric bypass surgery.  She is on intermittent Feraheme infusions, last one on 09/28/2016 and 10/12/2016. - Colonoscopy on 08/07/2013 with a large redundant colon and internal/external hemorrhoids. -EGD on 08/07/2013 with a Schatzki ring at GE junction and a small clean-based ulcer at anastomosis. -Last Feraheme was on 08/21/2017.  Her tiredness improved after that. -Hemoglobin today is 11.3.  Ferritin and iron panel are pending.  We will follow-up on it on Monday.  We will see her back in 3 months for follow-up with repeat labs one day prior.   2.  B12 deficiency: -Secondary to malabsorption from gastric bypass surgery. -MCV has gone up to 102.  She reports that she has not been compliant with every 3 weeks B12 injection as she had to get each dose from the pharmacy.  We will prescribe her a multidose while. -She will also start sublingual B12.  B12 level from today is pending.  3.  Vitamin D deficiency: - Her last vitamin D level was 31 in 2018.  She is taking 50,000 units weekly.  We have checked a  vitamin D level today which is pending.      Orders placed this encounter:  Orders Placed This Encounter  Procedures  . CBC with Differential/Platelet  . Comprehensive metabolic panel  . Ferritin  . Iron and TIBC  . Vitamin B12  . Folate      [name], MD [facility] [phone]

## 2017-09-13 NOTE — Assessment & Plan Note (Signed)
1. Iron deficiency anemia: - Secondary to malabsorption from gastric bypass surgery.  She is on intermittent Feraheme infusions, last one on 09/28/2016 and 10/12/2016. - Colonoscopy on 08/07/2013 with a large redundant colon and internal/external hemorrhoids. -EGD on 08/07/2013 with a Schatzki ring at GE junction and a small clean-based ulcer at anastomosis. -Last Feraheme was on 08/21/2017.  Her tiredness improved after that. -Hemoglobin today is 11.3.  Ferritin and iron panel are pending.  We will follow-up on it on Monday.  We will see her back in 3 months for follow-up with repeat labs one day prior.   2.  B12 deficiency: -Secondary to malabsorption from gastric bypass surgery. -MCV has gone up to 102.  She reports that she has not been compliant with every 3 weeks B12 injection as she had to get each dose from the pharmacy.  We will prescribe her a multidose while. -She will also start sublingual B12.  B12 level from today is pending.  3.  Vitamin D deficiency: - Her last vitamin D level was 31 in 2018.  She is taking 50,000 units weekly.  We have checked a vitamin D level today which is pending.

## 2017-09-14 LAB — VITAMIN D 25 HYDROXY (VIT D DEFICIENCY, FRACTURES): VIT D 25 HYDROXY: 26.4 ng/mL — AB (ref 30.0–100.0)

## 2017-09-16 ENCOUNTER — Other Ambulatory Visit (HOSPITAL_COMMUNITY): Payer: Self-pay | Admitting: *Deleted

## 2017-09-16 ENCOUNTER — Encounter (HOSPITAL_COMMUNITY): Payer: Self-pay | Admitting: *Deleted

## 2017-09-16 DIAGNOSIS — Z9884 Bariatric surgery status: Secondary | ICD-10-CM

## 2017-09-16 DIAGNOSIS — K912 Postsurgical malabsorption, not elsewhere classified: Secondary | ICD-10-CM

## 2017-09-16 DIAGNOSIS — D508 Other iron deficiency anemias: Secondary | ICD-10-CM

## 2017-09-16 DIAGNOSIS — K909 Intestinal malabsorption, unspecified: Secondary | ICD-10-CM

## 2017-09-16 MED ORDER — CYANOCOBALAMIN 1000 MCG/ML IJ SOLN: 1000.0000 ug | INTRAMUSCULAR | 1 refills | Status: DC

## 2017-09-16 MED ORDER — VITAMIN D (ERGOCALCIFEROL) 1.25 MG (50000 UNIT) PO CAPS: 50000.0000 [IU] | ORAL_CAPSULE | ORAL | 2 refills | Status: DC

## 2017-09-16 NOTE — Progress Notes (Signed)
Pt requested that her B-12 injections and vitamin D be called in for 90 days supply.

## 2017-10-17 ENCOUNTER — Other Ambulatory Visit (HOSPITAL_COMMUNITY): Payer: Self-pay | Admitting: Nurse Practitioner

## 2017-11-22 DIAGNOSIS — M542 Cervicalgia: Secondary | ICD-10-CM | POA: Diagnosis not present

## 2017-11-22 DIAGNOSIS — G43111 Migraine with aura, intractable, with status migrainosus: Secondary | ICD-10-CM | POA: Diagnosis not present

## 2017-11-22 DIAGNOSIS — G43719 Chronic migraine without aura, intractable, without status migrainosus: Secondary | ICD-10-CM | POA: Diagnosis not present

## 2017-11-22 DIAGNOSIS — G518 Other disorders of facial nerve: Secondary | ICD-10-CM | POA: Diagnosis not present

## 2017-11-22 DIAGNOSIS — M791 Myalgia, unspecified site: Secondary | ICD-10-CM | POA: Diagnosis not present

## 2017-12-11 ENCOUNTER — Inpatient Hospital Stay (HOSPITAL_COMMUNITY): Payer: BLUE CROSS/BLUE SHIELD | Attending: Hematology

## 2017-12-11 DIAGNOSIS — Z9884 Bariatric surgery status: Secondary | ICD-10-CM | POA: Insufficient documentation

## 2017-12-11 DIAGNOSIS — D511 Vitamin B12 deficiency anemia due to selective vitamin B12 malabsorption with proteinuria: Secondary | ICD-10-CM | POA: Diagnosis not present

## 2017-12-11 DIAGNOSIS — E559 Vitamin D deficiency, unspecified: Secondary | ICD-10-CM | POA: Diagnosis not present

## 2017-12-11 DIAGNOSIS — D508 Other iron deficiency anemias: Secondary | ICD-10-CM

## 2017-12-11 LAB — CBC WITH DIFFERENTIAL/PLATELET
Abs Immature Granulocytes: 0.01 10*3/uL (ref 0.00–0.07)
Basophils Absolute: 0 10*3/uL (ref 0.0–0.1)
Basophils Relative: 0 %
Eosinophils Absolute: 0.1 10*3/uL (ref 0.0–0.5)
Eosinophils Relative: 2 %
HCT: 36.7 % (ref 36.0–46.0)
Hemoglobin: 11.6 g/dL — ABNORMAL LOW (ref 12.0–15.0)
Immature Granulocytes: 0 %
Lymphocytes Relative: 26 %
Lymphs Abs: 1 10*3/uL (ref 0.7–4.0)
MCH: 32.6 pg (ref 26.0–34.0)
MCHC: 31.6 g/dL (ref 30.0–36.0)
MCV: 103.1 fL — ABNORMAL HIGH (ref 80.0–100.0)
Monocytes Absolute: 0.3 10*3/uL (ref 0.1–1.0)
Monocytes Relative: 8 %
Neutro Abs: 2.5 10*3/uL (ref 1.7–7.7)
Neutrophils Relative %: 64 %
Platelets: 283 10*3/uL (ref 150–400)
RBC: 3.56 MIL/uL — ABNORMAL LOW (ref 3.87–5.11)
RDW: 12.2 % (ref 11.5–15.5)
WBC: 3.8 10*3/uL — ABNORMAL LOW (ref 4.0–10.5)
nRBC: 0 % (ref 0.0–0.2)

## 2017-12-11 LAB — COMPREHENSIVE METABOLIC PANEL
ALT: 14 U/L (ref 0–44)
AST: 18 U/L (ref 15–41)
Albumin: 3.5 g/dL (ref 3.5–5.0)
Alkaline Phosphatase: 77 U/L (ref 38–126)
Anion gap: 4 — ABNORMAL LOW (ref 5–15)
BUN: 13 mg/dL (ref 8–23)
CO2: 24 mmol/L (ref 22–32)
Calcium: 8.5 mg/dL — ABNORMAL LOW (ref 8.9–10.3)
Chloride: 112 mmol/L — ABNORMAL HIGH (ref 98–111)
Creatinine, Ser: 1.04 mg/dL — ABNORMAL HIGH (ref 0.44–1.00)
GFR calc Af Amer: >60 mL/min (ref >60)
GFR calc non Af Amer: 58 mL/min — ABNORMAL LOW (ref >60)
Glucose, Bld: 74 mg/dL (ref 70–99)
Potassium: 3.4 mmol/L — ABNORMAL LOW (ref 3.5–5.1)
Sodium: 140 mmol/L (ref 135–145)
Total Bilirubin: 0.5 mg/dL (ref 0.3–1.2)
Total Protein: 6.2 g/dL — ABNORMAL LOW (ref 6.5–8.1)

## 2017-12-11 LAB — FOLATE: Folate: 5.6 ng/mL — ABNORMAL LOW (ref >5.9)

## 2017-12-11 LAB — IRON AND TIBC
Iron: 89 ug/dL (ref 28–170)
SATURATION RATIOS: 37 % — AB (ref 10.4–31.8)
TIBC: 240 ug/dL — ABNORMAL LOW (ref 250–450)
UIBC: 151 ug/dL

## 2017-12-11 LAB — FERRITIN: Ferritin: 142 ng/mL (ref 11–307)

## 2017-12-11 LAB — VITAMIN B12: Vitamin B-12: 1299 pg/mL — ABNORMAL HIGH (ref 180–914)

## 2017-12-13 ENCOUNTER — Ambulatory Visit (HOSPITAL_COMMUNITY): Payer: BLUE CROSS/BLUE SHIELD | Admitting: Hematology

## 2017-12-16 ENCOUNTER — Other Ambulatory Visit: Payer: Self-pay | Admitting: Nurse Practitioner

## 2018-01-17 ENCOUNTER — Inpatient Hospital Stay (HOSPITAL_COMMUNITY): Payer: BLUE CROSS/BLUE SHIELD | Attending: Hematology | Admitting: Hematology

## 2018-01-17 ENCOUNTER — Other Ambulatory Visit: Payer: Self-pay

## 2018-01-17 ENCOUNTER — Encounter (HOSPITAL_COMMUNITY): Payer: Self-pay | Admitting: Hematology

## 2018-01-17 VITALS — BP 123/60 | HR 56 | Temp 97.7°F | Resp 16 | Wt 201.4 lb

## 2018-01-17 DIAGNOSIS — E559 Vitamin D deficiency, unspecified: Secondary | ICD-10-CM | POA: Diagnosis not present

## 2018-01-17 DIAGNOSIS — D508 Other iron deficiency anemias: Secondary | ICD-10-CM

## 2018-01-17 DIAGNOSIS — E538 Deficiency of other specified B group vitamins: Secondary | ICD-10-CM

## 2018-01-17 DIAGNOSIS — D509 Iron deficiency anemia, unspecified: Secondary | ICD-10-CM

## 2018-01-17 DIAGNOSIS — K912 Postsurgical malabsorption, not elsewhere classified: Secondary | ICD-10-CM

## 2018-01-17 MED ORDER — FOLIC ACID 1 MG PO TABS: 1.0000 mg | ORAL_TABLET | Freq: Every day | ORAL | 3 refills | Status: DC

## 2018-01-17 NOTE — Patient Instructions (Signed)
[facility] at [facility] Discharge Instructions  You were seen today by Dr. Ellin Saba. He went over your last lab results, your folic acid was low and he would like for you start taking a Rx folic acid. He also went over your B12 injections and recent history. He will see you back 3 months in for labs and follow up.    Thank you for choosing [facility] at [facility] to provide your oncology and hematology care.  To afford each patient quality time with our provider, please arrive at least 15 minutes before your scheduled appointment time.   If you have a lab appointment with the Cancer Center please come in thru the  Main Entrance and check in at the main information desk  You need to re-schedule your appointment should you arrive 10 or more minutes late.  We strive to give you quality time with our providers, and arriving late affects you and other patients whose appointments are after yours.  Also, if you no show three or more times for appointments you may be dismissed from the clinic at the providers discretion.     Again, thank you for choosing [facility].  Our hope is that these requests will decrease the amount of time that you wait before being seen by our physicians.       _____________________________________________________________  Should you have questions after your visit to [facility], please contact our office at [phone] between the hours of 8:00 a.m. and 4:30 p.m.  Voicemails left after 4:00 p.m. will not be returned until the following business day.  For prescription refill requests, have your pharmacy contact our office and allow 72 hours.    Cancer Center Support Programs:   > Cancer Support Group  2nd Tuesday of the month 1pm-2pm, Journey Room

## 2018-01-17 NOTE — Progress Notes (Signed)
[facility] [address] [place], [place] [address]   CLINIC:  Medical Oncology/Hematology  PCP:  Sharon Sellerubanks, Jessica K, NP [address] [place] [id number] [phone]   REASON FOR VISIT:  Follow-up for iron deficiency anemia AND B12 deficiency  CURRENT THERAPY: Intermittent iron infusions AND B12 injections    INTERVAL HISTORY:  [name] 63 y.o. female returns for follow-up of B12 deficiency and iron deficiency.  She is taking B12 injections every 3 to 4 weeks.  She also takes B12 sublingual tablet daily.  Her last iron infusion was in June 2019.  Her energy levels are slightly low.  Appetite is 100%.  She takes ibuprofen occasionally for her back pain.  Denies any tingling or numbness next 20s.  Denies any fevers, night sweats or weight loss in the last 6 months.  No of infections or hospitalizations.   REVIEW OF SYSTEMS:  Review of Systems  All other systems reviewed and are negative.    PAST MEDICAL/SURGICAL HISTORY:  Past Medical History:  Diagnosis Date  . Anemia   . Chronic back pain   . Hypothyroidism    Past Surgical History:  Procedure Laterality Date  . CESAREAN SECTION  [id number],1991  . COLONOSCOPY N/A 08/07/2013   Procedure: COLONOSCOPY;  Surgeon: West BaliSandi L Fields, MD;  [facility];  Service: Endoscopy;  Laterality: N/A;  10:30-moved to [address] notified pt  . ESOPHAGOGASTRODUODENOSCOPY    . ESOPHAGOGASTRODUODENOSCOPY N/A 08/07/2013   Procedure: ESOPHAGOGASTRODUODENOSCOPY (EGD);  Surgeon: West BaliSandi L Fields, MD;  Location: [facility];  Service: Endoscopy;  Laterality: N/A;  . GASTRIC BYPASS  [address]2004   [place], [place]  . TONSILLECTOMY     Childhood  . UPPER GI ENDOSCOPY  2004   Dr.Borhanian      SOCIAL HISTORY:  Social History   Socioeconomic History  . Marital status: Married    Spouse name: Not on file  . Number of children: Not on file  . Years of education: Not on file  . Highest education level: Not on file    Occupational History  . Occupation: Environmental health practitionerDentist    Employer: [employer]  Social Needs  . Financial resource strain: Not on file  . Food insecurity:    Worry: Not on file    Inability: Not on file  . Transportation needs:    Medical: Not on file    Non-medical: Not on file  Tobacco Use  . Smoking status: Current Every Day Smoker    Packs/day: 0.50    Years: 10.00    Pack years: 5.00    Types: Cigarettes  . Smokeless tobacco: Never Used  . Tobacco comment: Relasped, 4 cig daily   Substance and Sexual Activity  . Alcohol use: No    Alcohol/week: 0.0 standard drinks  . Drug use: No  . Sexual activity: Yes  Lifestyle  . Physical activity:    Days per week: Not on file    Minutes per session: Not on file  . Stress: Not on file  Relationships  . Social connections:    Talks on phone: Not on file    Gets together: Not on file    Attends religious service: Not on file    Active member of club or organization: Not on file    Attends meetings of clubs or organizations: Not on file    Relationship status: Not on file  . Intimate partner violence:    Fear of current or ex  partner: Not on file    Emotionally abused: Not on file    Physically abused: Not on file    Forced sexual activity: Not on file  Other Topics Concern  . Not on file  Social History Narrative   Married, 1990, has 2 children. Patient lives in a multi-level home. Previously smoked 1/2 PPD, smoked for 10-20 years. Drinks a minimal amount of caffeinated beverages.     FAMILY HISTORY:  Family History  Problem Relation Age of Onset  . Heart disease Mother   . Diabetes Mother   . Stroke Mother   . Hypertension Mother   . Diabetes Father   . Heart disease Father   . Hypertension Father   . Migraines Daughter   . Colon cancer Neg Hx     CURRENT MEDICATIONS:  Outpatient Encounter Medications as of 01/17/2018  Medication Sig  . acetaminophen (TYLENOL) 500 MG tablet Take 500 mg by mouth every 6 (six)  hours as needed for mild pain or headache.  . cyanocobalamin (,VITAMIN B-12,) 1000 MCG/ML injection Inject 1 mL (1,000 mcg total) into the muscle every 21 ( twenty-one) days.  Marland Kitchen escitalopram (LEXAPRO) 10 MG tablet TAKE ONE TABLET BY MOUTH ONCE DAILY.  Marland Kitchen levothyroxine (SYNTHROID, LEVOTHROID) 50 MCG tablet TAKE ONE TABLET BY MOUTH ONCE DAILY.  Marland Kitchen Vitamin D, Ergocalciferol, (DRISDOL) 50000 units CAPS capsule Take 1 capsule (50,000 Units total) by mouth every 7 (seven) days.  Marland Kitchen zonisamide (ZONEGRAN) 100 MG capsule Take 200 mg by mouth daily.   . [DISCONTINUED] Vitamin D, Ergocalciferol, (DRISDOL) 50000 units CAPS capsule TAKE 1 CAPSULE BY MOUTH ONCE A WEEK.  . folic acid (FOLVITE) 1 MG tablet Take 1 tablet (1 mg total) by mouth daily.   No facility-administered encounter medications on file as of 01/17/2018.     ALLERGIES:  No Known Allergies   PHYSICAL EXAM:  ECOG Performance status: 0  Vitals:   01/17/18 1409  BP: 123/60  Pulse: (!) 56  Resp: 16  Temp: 97.7 F (36.5 C)  SpO2: 100%   Filed Weights   01/17/18 1409  Weight: 201 lb 6.4 oz (91.4 kg)    Physical Exam Constitutional:      Appearance: She is well-developed.  Cardiovascular:     Rate and Rhythm: Normal rate and regular rhythm.     Heart sounds: Normal heart sounds.  Pulmonary:     Effort: Pulmonary effort is normal.     Breath sounds: Normal breath sounds.  Abdominal:     Palpations: Abdomen is soft.  Musculoskeletal: Normal range of motion.  Skin:    General: Skin is warm and dry.  Neurological:     Mental Status: She is alert and oriented to person, place, and time.  Psychiatric:        Behavior: Behavior normal.        Thought Content: Thought content normal.        Judgment: Judgment normal.    No palpable adenopathy or hepatosplenomegaly.  LABORATORY DATA:  I have reviewed the labs as listed.  CBC    Component Value Date/Time   WBC 3.8 (L) 12/11/2017 0907   RBC 3.56 (L) 12/11/2017 0907   HGB  11.6 (L) 12/11/2017 0907   HCT 36.7 12/11/2017 0907   PLT 283 12/11/2017 0907   MCV 103.1 (H) 12/11/2017 0907   MCH 32.6 12/11/2017 0907   MCHC 31.6 12/11/2017 0907   RDW 12.2 12/11/2017 0907   LYMPHSABS 1.0 12/11/2017 0907   MONOABS 0.3 12/11/2017  0907   EOSABS 0.1 12/11/2017 0907   BASOSABS 0.0 12/11/2017 0907   CMP Latest Ref Rng & Units 12/11/2017 06/14/2017 03/22/2017  Glucose 70 - 99 mg/dL 74 166(M) 93  BUN 8 - 23 mg/dL 13 11 13   Creatinine 0.44 - 1.00 mg/dL 6.00(K) 5.99 7.74  Sodium 135 - 145 mmol/L 140 139 141  Potassium 3.5 - 5.1 mmol/L 3.4(L) 3.2(L) 3.7  Chloride 98 - 111 mmol/L 112(H) 110 109  CO2 22 - 32 mmol/L 24 24 23   Calcium 8.9 - 10.3 mg/dL 1.4(E) 3.9(R) 3.2(Y)  Total Protein 6.5 - 8.1 g/dL 6.2(L) 6.1(L) 6.1(L)  Total Bilirubin 0.3 - 1.2 mg/dL 0.5 0.5 0.6  Alkaline Phos 38 - 126 U/L 77 96 87  AST 15 - 41 U/L 18 20 15   ALT 0 - 44 U/L 14 18 11(L)         ASSESSMENT & PLAN:   Iron deficiency anemia 1. Iron deficiency anemia: - Secondary to malabsorption from gastric bypass surgery.  She is on intermittent Feraheme infusions, last one on 09/28/2016 and 10/12/2016. - Colonoscopy on 08/07/2013 with a large redundant colon and internal/external hemorrhoids. -EGD on 08/07/2013 with a Schatzki ring at GE junction and a small clean-based ulcer at anastomosis. -Last Feraheme was on 06/21/2017.  Her tiredness improved after that. -We reviewed her blood work from December 2019.  Hemoglobin was 11.6.  Ferritin was 142 with percent saturation of 37. As she does not require any Feraheme at this time. -Her folic acid was low.  We have sent in a prescription for folic acid 1 mg daily. -We will see her back in 3 months with repeat blood work. -Her creatinine was also mildly elevated for the first time.  We will closely monitor it.  She was advised to drink a lot of fluids.  She was told to minimize intake of NSAIDs.  2.  B12 deficiency: -Secondary to malabsorption from gastric  bypass surgery. -She is taking B12 injections subcu every 3 to 4 weeks at home.  She also takes sublingual B12 tablet daily.  3.  Vitamin D deficiency: - She is taking 50,000 units weekly.  Her vitamin D was 26 in September 2019.  We will repeated prior to next visit.       Orders placed this encounter:  No orders of the defined types were placed in this encounter.     [name], MD [facility] [phone]

## 2018-01-17 NOTE — Assessment & Plan Note (Signed)
1. Iron deficiency anemia: - Secondary to malabsorption from gastric bypass surgery.  She is on intermittent Feraheme infusions, last one on 09/28/2016 and 10/12/2016. - Colonoscopy on 08/07/2013 with a large redundant colon and internal/external hemorrhoids. -EGD on 08/07/2013 with a Schatzki ring at GE junction and a small clean-based ulcer at anastomosis. -Last Feraheme was on 06/21/2017.  Her tiredness improved after that. -We reviewed her blood work from December 2019.  Hemoglobin was 11.6.  Ferritin was 142 with percent saturation of 37. As she does not require any Feraheme at this time. -Her folic acid was low.  We have sent in a prescription for folic acid 1 mg daily. -We will see her back in 3 months with repeat blood work. -Her creatinine was also mildly elevated for the first time.  We will closely monitor it.  She was advised to drink a lot of fluids.  She was told to minimize intake of NSAIDs.  2.  B12 deficiency: -Secondary to malabsorption from gastric bypass surgery. -She is taking B12 injections subcu every 3 to 4 weeks at home.  She also takes sublingual B12 tablet daily.  3.  Vitamin D deficiency: - She is taking 50,000 units weekly.  Her vitamin D was 26 in September 2019.  We will repeated prior to next visit.

## 2018-01-18 ENCOUNTER — Other Ambulatory Visit: Payer: Self-pay | Admitting: Nurse Practitioner

## 2018-01-27 ENCOUNTER — Other Ambulatory Visit: Payer: Self-pay | Admitting: *Deleted

## 2018-01-27 MED ORDER — ESCITALOPRAM OXALATE 10 MG PO TABS: 10.0000 mg | ORAL_TABLET | Freq: Every day | ORAL | 0 refills | Status: DC

## 2018-01-27 NOTE — Telephone Encounter (Signed)
Patient called and scheduled appt, med refilled for 11 days until appt. rx sent to pharmacy by e-script

## 2018-02-07 ENCOUNTER — Encounter: Payer: Self-pay | Admitting: Nurse Practitioner

## 2018-02-07 ENCOUNTER — Ambulatory Visit: Payer: BLUE CROSS/BLUE SHIELD | Admitting: Nurse Practitioner

## 2018-02-07 VITALS — BP 118/70 | HR 64 | Temp 98.3°F | Ht 69.0 in | Wt 199.0 lb

## 2018-02-07 DIAGNOSIS — G43919 Migraine, unspecified, intractable, without status migrainosus: Secondary | ICD-10-CM

## 2018-02-07 DIAGNOSIS — R7989 Other specified abnormal findings of blood chemistry: Secondary | ICD-10-CM

## 2018-02-07 DIAGNOSIS — E034 Atrophy of thyroid (acquired): Secondary | ICD-10-CM | POA: Diagnosis not present

## 2018-02-07 DIAGNOSIS — E785 Hyperlipidemia, unspecified: Secondary | ICD-10-CM | POA: Diagnosis not present

## 2018-02-07 DIAGNOSIS — E119 Type 2 diabetes mellitus without complications: Secondary | ICD-10-CM

## 2018-02-07 DIAGNOSIS — F325 Major depressive disorder, single episode, in full remission: Secondary | ICD-10-CM | POA: Insufficient documentation

## 2018-02-07 DIAGNOSIS — K909 Intestinal malabsorption, unspecified: Secondary | ICD-10-CM

## 2018-02-07 NOTE — Patient Instructions (Addendum)
Follow up in 6 months for CPE  Get These Tests  Blood Pressure- Have your blood pressure checked by your healthcare provider at least once a year.  Normal blood pressure is 120/80. Goal for most <140/90.  Weight- Have your body mass index (BMI) calculated to screen for obesity.  BMI is a measure of body fat based on height and weight.  You can calculate your own BMI at https://www.west-esparza.com/www.nhlbisupport.com/bmi/  Cholesterol- Have your cholesterol checked every year.  Diabetes- Have your blood sugar checked every year if you have high blood pressure, high cholesterol, a family history of diabetes or if you are overweight.  Pap Test - Have a pap test every 1 to 5 years if you have been sexually active.  If you are older than 65 and recent pap tests have been normal you may not need additional pap tests.  In addition, if you have had a hysterectomy  for benign disease additional pap tests are not necessary.  Mammogram-Yearly mammograms are essential for early detection of breast cancer  Screening for Colon Cancer- Colonoscopy starting at age 150. Screening may begin sooner depending on your family history and other health conditions.  Follow up colonoscopy as directed by your Gastroenterologist.  Screening for Osteoporosis- Screening begins at age 63 with bone density scanning, sooner if you are at higher risk for developing Osteoporosis.   Get these medicines  Calcium with Vitamin D- Your body requires 1200-1500 mg of Calcium a day and [phone] IU of Vitamin D a day.  You can only absorb 500 mg of Calcium at a time therefore Calcium must be taken in 2 or 3 separate doses throughout the day.  Hormones- Hormone therapy has been associated with increased risk for certain cancers and heart disease.  Talk to your healthcare provider about if you need relief from menopausal symptoms.  Aspirin- Ask your healthcare provider about taking Aspirin to prevent Heart Disease and Stroke.   Get these Immuniztions  Flu  shot- Every fall  Pneumonia shot- Once after the age of 63; if you are younger ask your healthcare provider if you need a pneumonia shot.  Tetanus- Every ten years.  Shingrix- Once after the age of 63 to prevent shingles.   Take these steps  Don't smoke- Your healthcare provider can help you quit. For tips on how to quit, ask your healthcare provider or go to www.smokefree.gov or call 1-800 QUIT-NOW.  Be physically active- Exercise 5 days a week for a minimum of 30 minutes.  If you are not already physically active, start slow and gradually work up to 30 minutes of moderate physical activity.  Try walking, dancing, bike riding, swimming, etc.  Eat a healthy diet- Eat a variety of healthy foods such as fruits, vegetables, whole grains, low fat milk, low fat cheeses, yogurt, lean meats, chicken, fish, eggs, dried beans, tofu, etc.  For more information go to www.thenutritionsource.org  Dental visit- Brush and floss teeth twice daily; visit your dentist twice a year.  Eye exam- Visit your Optometrist or Ophthalmologist yearly.  Drink alcohol in moderation- Limit alcohol intake to one drink or less a day.  Never drink and drive.  Depression- Your emotional health is as important as your physical health.  If you're feeling down or losing interest in things you normally enjoy, please talk to your healthcare provider.  Seat Belts- can save your life; always wear one  Smoke/Carbon Monoxide detectors- These detectors need to be installed on the appropriate level of your home.  Replace batteries at least once a year.  Violence- If anyone is threatening or hurting you, please tell your healthcare provider.  Living Will/ Health care power of attorney- Discuss with your healthcare provider and family.

## 2018-02-07 NOTE — Progress Notes (Signed)
Careteam: Patient Care Team: Sharon SellerEubanks, Ibraham Levi K, NP as PCP - General (Geriatric Medicine) Kermit Baloeed, [name], DO as Consulting Physician (Geriatric Medicine)  Advanced Directive information    No Known Allergies  Chief Complaint  Patient presents with  . Medical Management of Chronic Issues    follow-up     HPI: Patient is a 63 y.o. female seen in the office today for routine follow up.   Pt [facility] with [facility]. On zonisamide 200 mg daily for chronic migraines with injections which manages symptoms. Will use zanaflex PRN for breakthrough.   Anxiety/depression- continues on lexapro 10 mg dail which controls symptoms. Ran out and became very irriatable.   Overweight- has continued to lose weight, hx of gastric bypass. Was more active when she went on a trip but has not really kept up with this.  She was 348 lbs at the time of gastric bypass then had addiction with narcotics and lost down to 170 lbs. She then gained up to 232 lb when she was on the road to recovery. Went to a lot of doctors appt and therapies. Eating fast food.  Now she has focused on health. Drinking more water and on a diet with her headache management.  Overall feels so much better than before.   Malabsorption after bypass [facility]. Started her on folic acid Recently noticed elevation in Cr. Trying to drink more water Vit D 50,000 units weekly And taking B12 injection every 3 weeks.   hypothryoid- on synthroid 50mcg daily   Chronic back pain- uses tylenol and acupuncture, massage and chiropractor for maintenance.    Review of Systems:  Review of Systems  Constitutional: Negative for chills, fever and weight loss.  HENT: Negative for tinnitus.   Respiratory: Negative for cough, sputum production and shortness of breath.   Cardiovascular: Negative for chest pain, palpitations and leg swelling.  Gastrointestinal: Negative for  abdominal pain, constipation, diarrhea and heartburn.  Genitourinary: Negative for dysuria, frequency and urgency.  Musculoskeletal: Positive for back pain (lumbar). Negative for falls, joint pain and myalgias.  Skin: Negative.   Neurological: Positive for headaches (controlled). Negative for dizziness. Loss of consciousness: c.  Psychiatric/Behavioral: Negative for depression and memory loss. The patient does not have insomnia.        Depression/anxiety in remission   Past Medical History:  Diagnosis Date  . Anemia   . Chronic back pain   . Hypothyroidism    Past Surgical History:  Procedure Laterality Date  . CESAREAN SECTION  [id number],1991  . COLONOSCOPY N/A 08/07/2013   Procedure: COLONOSCOPY;  Surgeon: West BaliSandi L Fields, MD;  [facility];  Service: Endoscopy;  Laterality: N/A;  10:30-moved to [address] notified pt  . ESOPHAGOGASTRODUODENOSCOPY    . ESOPHAGOGASTRODUODENOSCOPY N/A 08/07/2013   Procedure: ESOPHAGOGASTRODUODENOSCOPY (EGD);  Surgeon: West BaliSandi L Fields, MD;  [facility];  Service: Endoscopy;  Laterality: N/A;  . GASTRIC BYPASS  [address]   [place], [place]  . TONSILLECTOMY     Childhood  . UPPER GI ENDOSCOPY  2004   Dr.Borhanian    Social History:   reports that she has been smoking cigarettes. She has a 5.00 pack-year smoking history. She has never used smokeless tobacco. She reports that she does not drink alcohol or use drugs.  Family History  Problem Relation Age of Onset  . Heart disease Mother   . Diabetes Mother   . Stroke Mother   .  Hypertension Mother   . Diabetes Father   . Heart disease Father   . Hypertension Father   . Migraines Daughter   . Colon cancer Neg Hx     Medications: Patient's Medications  New Prescriptions   No medications on file  Previous Medications   ACETAMINOPHEN (TYLENOL) 500 MG TABLET    Take 500 mg by mouth every 6 (six) hours as needed for mild pain or headache.   CYANOCOBALAMIN (,VITAMIN B-12,) 1000 MCG/ML  INJECTION    Inject 1 mL (1,000 mcg total) into the muscle every 21 ( twenty-one) days.   ESCITALOPRAM (LEXAPRO) 10 MG TABLET    Take 1 tablet (10 mg total) by mouth daily.   FOLIC ACID (FOLVITE) 1 MG TABLET    Take 1 tablet (1 mg total) by mouth daily.   LEVOTHYROXINE (SYNTHROID, LEVOTHROID) 50 MCG TABLET    TAKE ONE TABLET BY MOUTH ONCE DAILY.   VITAMIN D, ERGOCALCIFEROL, (DRISDOL) 50000 UNITS CAPS CAPSULE    Take 1 capsule (50,000 Units total) by mouth every 7 (seven) days.   ZONISAMIDE (ZONEGRAN) 100 MG CAPSULE    Take 200 mg by mouth daily.   Modified Medications   No medications on file  Discontinued Medications   No medications on file     Physical Exam:  Vitals:   02/07/18 0835  BP: 118/70  Pulse: 64  Temp: 98.3 F (36.8 C)  TempSrc: Oral  SpO2: 99%  Weight: 199 lb (90.3 kg)  Height: 5\' 9"  (1.753 m)   Body mass index is 29.39 kg/m.  Physical Exam Constitutional:      General: She is not in acute distress.    Appearance: She is well-developed. She is not diaphoretic.  HENT:     Head: Normocephalic and atraumatic.     Mouth/Throat:     Pharynx: No oropharyngeal exudate.  Eyes:     Conjunctiva/sclera: Conjunctivae normal.     Pupils: Pupils are equal, round, and reactive to light.  Neck:     Musculoskeletal: Normal range of motion and neck supple.  Cardiovascular:     Rate and Rhythm: Normal rate and regular rhythm.     Pulses: Normal pulses.     Heart sounds: Normal heart sounds.  Pulmonary:     Effort: Pulmonary effort is normal.     Breath sounds: Normal breath sounds.  Abdominal:     General: Bowel sounds are normal.     Palpations: Abdomen is soft.  Genitourinary:    Vagina: Normal.  Musculoskeletal: Normal range of motion.        General: No tenderness.  Skin:    General: Skin is warm and dry.     Capillary Refill: Capillary refill takes less than 2 seconds.  Neurological:     General: No focal deficit present.     Mental Status: She is alert.       Comments: Normal monofilament   Psychiatric:        Mood and Affect: Mood normal.     Labs reviewed: Basic Metabolic Panel: Recent Labs    03/22/17 0850 06/14/17 1422 12/11/17 0907  NA 141 139 140  K 3.7 3.2* 3.4*  CL 109 110 112*  CO2 23 24 24   GLUCOSE 93 258* 74  BUN 13 11 13   CREATININE 0.83 0.90 1.04*  CALCIUM 8.6* 8.6* 8.5*   Liver Function Tests: Recent Labs    03/22/17 0850 06/14/17 1422 12/11/17 0907  AST 15 20 18   ALT 11* 18 14  ALKPHOS 87 96 77  BILITOT 0.6 0.5 0.5  PROT 6.1* 6.1* 6.2*  ALBUMIN 3.5 3.5 3.5   No results for input(s): LIPASE, AMYLASE in the last 8760 hours. No results for input(s): AMMONIA in the last 8760 hours. CBC: Recent Labs    06/14/17 1422 09/13/17 1003 12/11/17 0907  WBC 3.0* 4.6 3.8*  NEUTROABS 1.4* 2.9 2.5  HGB 11.9* 11.3* 11.6*  HCT 36.7 34.9* 36.7  MCV 101.7* 102.0* 103.1*  PLT 271 265 283   Lipid Panel: No results for input(s): CHOL, HDL, LDLCALC, TRIG, CHOLHDL, LDLDIRECT in the last 8760 hours. TSH: No results for input(s): TSH in the last 8760 hours. A1C: Lab Results  Component Value Date   HGBA1C 5.2 11/02/2016     Assessment/Plan 1. Type 2 diabetes mellitus without complication, without long-term current use of insulin (HCC) -diet controlled. Encouraged dietary compliance, routine foot care/monitoring and to keep up with diabetic eye exams through ophthalmology  - Hemoglobin A1c - Microalbumin, urine  2. Hyperlipidemia, unspecified hyperlipidemia type Diet controlled.  - Lipid panel  3. Hypothyroidism due to acquired atrophy of thyroid -continues on synthroid 50 mcg - TSH  4. Elevated serum creatinine Has increased hydration. She does not take NSAIDs  - BASIC METABOLIC PANEL WITH GFR  5. Malabsorption of iron S/p gastric bypass, Continues to follow up with hematology, continues on b12 injection and folic acid supplements  6. Major depression, single episode, in complete remission  (HCC) Well controlled on celexa.  7. Migraine Following with headache specialist. Controlled on current regimen.   Next appt: 6 months for CPE- needs pap Mylena Sedberry K. Biagio BorgEubanks, AGNP  [facility] [phone]

## 2018-02-08 LAB — BASIC METABOLIC PANEL WITH GFR
BUN: 18 mg/dL (ref 7–25)
CO2: 27 mmol/L (ref 20–32)
Calcium: 9.1 mg/dL (ref 8.6–10.4)
Chloride: 110 mmol/L (ref 98–110)
Creat: 0.92 mg/dL (ref 0.50–0.99)
GFR, Est African American: 77 mL/min/{1.73_m2} (ref >=60)
GFR, Est Non African American: 67 mL/min/{1.73_m2} (ref >=60)
Glucose, Bld: 78 mg/dL (ref 65–99)
Potassium: 4.4 mmol/L (ref 3.5–5.3)
Sodium: 142 mmol/L (ref 135–146)

## 2018-02-08 LAB — MICROALBUMIN, URINE: MICROALB UR: 0.2 mg/dL

## 2018-02-08 LAB — HEMOGLOBIN A1C
Hgb A1c MFr Bld: 5.2 % of total Hgb (ref <5.7)
Mean Plasma Glucose: 103 (calc)
eAG (mmol/L): 5.7 (calc)

## 2018-02-08 LAB — TSH: TSH: 2.63 mIU/L (ref 0.40–4.50)

## 2018-02-08 LAB — LIPID PANEL
Cholesterol: 194 mg/dL (ref <200)
HDL: 61 mg/dL (ref >50)
LDL Cholesterol (Calc): 114 mg/dL (calc) — ABNORMAL HIGH
NON-HDL CHOLESTEROL (CALC): 133 mg/dL — AB (ref <130)
Total CHOL/HDL Ratio: 3.2 (calc) (ref <5.0)
Triglycerides: 88 mg/dL (ref <150)

## 2018-02-19 ENCOUNTER — Other Ambulatory Visit: Payer: Self-pay | Admitting: Nurse Practitioner

## 2018-02-21 DIAGNOSIS — G518 Other disorders of facial nerve: Secondary | ICD-10-CM | POA: Diagnosis not present

## 2018-02-21 DIAGNOSIS — G43111 Migraine with aura, intractable, with status migrainosus: Secondary | ICD-10-CM | POA: Diagnosis not present

## 2018-02-21 DIAGNOSIS — G43719 Chronic migraine without aura, intractable, without status migrainosus: Secondary | ICD-10-CM | POA: Diagnosis not present

## 2018-02-21 DIAGNOSIS — M791 Myalgia, unspecified site: Secondary | ICD-10-CM | POA: Diagnosis not present

## 2018-02-21 DIAGNOSIS — M542 Cervicalgia: Secondary | ICD-10-CM | POA: Diagnosis not present

## 2018-03-15 DIAGNOSIS — F4323 Adjustment disorder with mixed anxiety and depressed mood: Secondary | ICD-10-CM | POA: Diagnosis not present

## 2018-04-12 DIAGNOSIS — F4323 Adjustment disorder with mixed anxiety and depressed mood: Secondary | ICD-10-CM | POA: Diagnosis not present

## 2018-04-17 ENCOUNTER — Other Ambulatory Visit (HOSPITAL_COMMUNITY): Payer: BLUE CROSS/BLUE SHIELD

## 2018-04-25 ENCOUNTER — Ambulatory Visit (HOSPITAL_COMMUNITY): Payer: BLUE CROSS/BLUE SHIELD | Admitting: Hematology

## 2018-05-16 DIAGNOSIS — M791 Myalgia, unspecified site: Secondary | ICD-10-CM | POA: Diagnosis not present

## 2018-05-16 DIAGNOSIS — G43111 Migraine with aura, intractable, with status migrainosus: Secondary | ICD-10-CM | POA: Diagnosis not present

## 2018-05-16 DIAGNOSIS — M542 Cervicalgia: Secondary | ICD-10-CM | POA: Diagnosis not present

## 2018-05-16 DIAGNOSIS — G43719 Chronic migraine without aura, intractable, without status migrainosus: Secondary | ICD-10-CM | POA: Diagnosis not present

## 2018-05-16 DIAGNOSIS — G518 Other disorders of facial nerve: Secondary | ICD-10-CM | POA: Diagnosis not present

## 2018-05-21 DIAGNOSIS — F4323 Adjustment disorder with mixed anxiety and depressed mood: Secondary | ICD-10-CM | POA: Diagnosis not present

## 2018-06-03 ENCOUNTER — Other Ambulatory Visit: Payer: Self-pay | Admitting: Nurse Practitioner

## 2018-06-03 ENCOUNTER — Other Ambulatory Visit (HOSPITAL_COMMUNITY): Payer: Self-pay | Admitting: Hematology

## 2018-06-04 ENCOUNTER — Other Ambulatory Visit (HOSPITAL_COMMUNITY): Payer: Self-pay | Admitting: Nurse Practitioner

## 2018-06-04 DIAGNOSIS — Z1231 Encounter for screening mammogram for malignant neoplasm of breast: Secondary | ICD-10-CM

## 2018-06-27 DIAGNOSIS — F4323 Adjustment disorder with mixed anxiety and depressed mood: Secondary | ICD-10-CM | POA: Diagnosis not present

## 2018-07-08 ENCOUNTER — Other Ambulatory Visit (HOSPITAL_COMMUNITY): Payer: Self-pay | Admitting: Nurse Practitioner

## 2018-07-08 DIAGNOSIS — D508 Other iron deficiency anemias: Secondary | ICD-10-CM

## 2018-07-08 DIAGNOSIS — K912 Postsurgical malabsorption, not elsewhere classified: Secondary | ICD-10-CM

## 2018-07-08 DIAGNOSIS — Z9884 Bariatric surgery status: Secondary | ICD-10-CM

## 2018-07-08 DIAGNOSIS — K909 Intestinal malabsorption, unspecified: Secondary | ICD-10-CM

## 2018-08-08 ENCOUNTER — Encounter: Payer: BLUE CROSS/BLUE SHIELD | Admitting: Nurse Practitioner

## 2018-08-08 DIAGNOSIS — F4323 Adjustment disorder with mixed anxiety and depressed mood: Secondary | ICD-10-CM | POA: Diagnosis not present

## 2018-08-19 DIAGNOSIS — G43111 Migraine with aura, intractable, with status migrainosus: Secondary | ICD-10-CM | POA: Diagnosis not present

## 2018-08-19 DIAGNOSIS — G43719 Chronic migraine without aura, intractable, without status migrainosus: Secondary | ICD-10-CM | POA: Diagnosis not present

## 2018-08-19 DIAGNOSIS — M542 Cervicalgia: Secondary | ICD-10-CM | POA: Diagnosis not present

## 2018-08-29 ENCOUNTER — Other Ambulatory Visit: Payer: Self-pay

## 2018-08-29 ENCOUNTER — Ambulatory Visit (INDEPENDENT_AMBULATORY_CARE_PROVIDER_SITE_OTHER): Payer: BC Managed Care – PPO | Admitting: Nurse Practitioner

## 2018-08-29 ENCOUNTER — Encounter: Payer: Self-pay | Admitting: Nurse Practitioner

## 2018-08-29 VITALS — BP 126/78 | HR 67 | Temp 98.2°F | Ht 69.0 in | Wt 196.4 lb

## 2018-08-29 DIAGNOSIS — Z0000 Encounter for general adult medical examination without abnormal findings: Secondary | ICD-10-CM

## 2018-08-29 DIAGNOSIS — E559 Vitamin D deficiency, unspecified: Secondary | ICD-10-CM

## 2018-08-29 DIAGNOSIS — Z124 Encounter for screening for malignant neoplasm of cervix: Secondary | ICD-10-CM | POA: Diagnosis not present

## 2018-08-29 DIAGNOSIS — E538 Deficiency of other specified B group vitamins: Secondary | ICD-10-CM

## 2018-08-29 DIAGNOSIS — Z9884 Bariatric surgery status: Secondary | ICD-10-CM

## 2018-08-29 DIAGNOSIS — E785 Hyperlipidemia, unspecified: Secondary | ICD-10-CM | POA: Diagnosis not present

## 2018-08-29 DIAGNOSIS — E119 Type 2 diabetes mellitus without complications: Secondary | ICD-10-CM | POA: Diagnosis not present

## 2018-08-29 DIAGNOSIS — Z23 Encounter for immunization: Secondary | ICD-10-CM

## 2018-08-29 DIAGNOSIS — E039 Hypothyroidism, unspecified: Secondary | ICD-10-CM

## 2018-08-29 DIAGNOSIS — K912 Postsurgical malabsorption, not elsewhere classified: Secondary | ICD-10-CM | POA: Diagnosis not present

## 2018-08-29 NOTE — Patient Instructions (Addendum)
 Health Maintenance, Female Adopting a healthy lifestyle and getting preventive care are important in promoting health and wellness. Ask your health care provider about:  The right schedule for you to have regular tests and exams.  Things you can do on your own to prevent diseases and keep yourself healthy. What should I know about diet, weight, and exercise? Eat a healthy diet   Eat a diet that includes plenty of vegetables, fruits, low-fat dairy products, and lean protein.  Do not eat a lot of foods that are high in solid fats, added sugars, or sodium. Maintain a healthy weight Body mass index (BMI) is used to identify weight problems. It estimates body fat based on height and weight. Your health care provider can help determine your BMI and help you achieve or maintain a healthy weight. Get regular exercise Get regular exercise. This is one of the most important things you can do for your health. Most adults should:  Exercise for at least 150 minutes each week. The exercise should increase your heart rate and make you sweat (moderate-intensity exercise).  Do strengthening exercises at least twice a week. This is in addition to the moderate-intensity exercise.  Spend less time sitting. Even light physical activity can be beneficial. Watch cholesterol and blood lipids Have your blood tested for lipids and cholesterol at 63 years of age, then have this test every 5 years. Have your cholesterol levels checked more often if:  Your lipid or cholesterol levels are high.  You are older than 63 years of age.  You are at high risk for heart disease. What should I know about cancer screening? Depending on your health history and family history, you may need to have cancer screening at various ages. This may include screening for:  Breast cancer.  Cervical cancer.  Colorectal cancer.  Skin cancer.  Lung cancer. What should I know about heart disease, diabetes, and high blood  pressure? Blood pressure and heart disease  High blood pressure causes heart disease and increases the risk of stroke. This is more likely to develop in people who have high blood pressure readings, are of African descent, or are overweight.  Have your blood pressure checked: ? Every 3-5 years if you are 18-39 years of age. ? Every year if you are 40 years old or older. Diabetes Have regular diabetes screenings. This checks your fasting blood sugar level. Have the screening done:  Once every three years after age 40 if you are at a normal weight and have a low risk for diabetes.  More often and at a younger age if you are overweight or have a high risk for diabetes. What should I know about preventing infection? Hepatitis B If you have a higher risk for hepatitis B, you should be screened for this virus. Talk with your health care provider to find out if you are at risk for hepatitis B infection. Hepatitis C Testing is recommended for:  Everyone born from 1945 through 1965.  Anyone with known risk factors for hepatitis C. Sexually transmitted infections (STIs)  Get screened for STIs, including gonorrhea and chlamydia, if: ? You are sexually active and are younger than 63 years of age. ? You are older than 63 years of age and your health care provider tells you that you are at risk for this type of infection. ? Your sexual activity has changed since you were last screened, and you are at increased risk for chlamydia or gonorrhea. Ask your health care provider   if you are at risk.  Ask your health care provider about whether you are at high risk for HIV. Your health care provider may recommend a prescription medicine to help prevent HIV infection. If you choose to take medicine to prevent HIV, you should first get tested for HIV. You should then be tested every 3 months for as long as you are taking the medicine. Pregnancy  If you are about to stop having your period (premenopausal) and  you may become pregnant, seek counseling before you get pregnant.  Take 400 to 800 micrograms (mcg) of folic acid every day if you become pregnant.  Ask for birth control (contraception) if you want to prevent pregnancy. Osteoporosis and menopause Osteoporosis is a disease in which the bones lose minerals and strength with aging. This can result in bone fractures. If you are 65 years old or older, or if you are at risk for osteoporosis and fractures, ask your health care provider if you should:  Be screened for bone loss.  Take a calcium or vitamin D supplement to lower your risk of fractures.  Be given hormone replacement therapy (HRT) to treat symptoms of menopause. Follow these instructions at home: Lifestyle  Do not use any products that contain nicotine or tobacco, such as cigarettes, e-cigarettes, and chewing tobacco. If you need help quitting, ask your health care provider.  Do not use street drugs.  Do not share needles.  Ask your health care provider for help if you need support or information about quitting drugs. Alcohol use  Do not drink alcohol if: ? Your health care provider tells you not to drink. ? You are pregnant, may be pregnant, or are planning to become pregnant.  If you drink alcohol: ? Limit how much you use to 0-1 drink a day. ? Limit intake if you are breastfeeding.  Be aware of how much alcohol is in your drink. In the [place] one drink equals one 12 oz bottle of beer (355 mL), one 5 oz glass of wine (148 mL), or one 1 oz glass of hard liquor (44 mL). General instructions  Schedule regular health, dental, and eye exams.  Stay current with your vaccines.  Tell your health care provider if: ? You often feel depressed. ? You have ever been abused or do not feel safe at home. Summary  Adopting a healthy lifestyle and getting preventive care are important in promoting health and wellness.  Follow your health care provider's instructions about healthy  diet, exercising, and getting tested or screened for diseases.  Follow your health care provider's instructions on monitoring your cholesterol and blood pressure. This information is not intended to replace advice given to you by your health care provider. Make sure you discuss any questions you have with your health care provider. Document Released: 07/10/2010 Document Revised: 12/18/2017 Document Reviewed: 12/18/2017 Elsevier Patient Education  2020 Elsevier Inc.  Get These Tests  Blood Pressure- Have your blood pressure checked by your healthcare provider at least once a year.  Normal blood pressure is 120/80. Goal for most <140/90.  Weight- Have your body mass index (BMI) calculated to screen for obesity.  BMI is a measure of body fat based on height and weight.  You can calculate your own BMI at www.nhlbisupport.com/bmi/  Cholesterol- Have your cholesterol checked every year.  Diabetes- Have your blood sugar checked every year if you have high blood pressure, high cholesterol, a family history of diabetes or if you are overweight.  Pap Test -   Have a pap test every 1 to 5 years if you have been sexually active.  If you are older than 65 and recent pap tests have been normal you may not need additional pap tests.  In addition, if you have had a hysterectomy  for benign disease additional pap tests are not necessary.  Mammogram-Yearly mammograms are essential for early detection of breast cancer  Screening for Colon Cancer- Colonoscopy starting at age 50. Screening may begin sooner depending on your family history and other health conditions.  Follow up colonoscopy as directed by your Gastroenterologist.  Screening for Osteoporosis- Screening begins at age 65 with bone density scanning, sooner if you are at higher risk for developing Osteoporosis.   Get these medicines  Calcium with Vitamin D- Your body requires 1200-1500 mg of Calcium a day and 800-1000 IU of Vitamin D a day.  You can  only absorb 500 mg of Calcium at a time therefore Calcium must be taken in 2 or 3 separate doses throughout the day.  Hormones- Hormone therapy has been associated with increased risk for certain cancers and heart disease.  Talk to your healthcare provider about if you need relief from menopausal symptoms.  Aspirin- Ask your healthcare provider about taking Aspirin to prevent Heart Disease and Stroke.   Get these Immuniztions  Flu shot- Every fall  Pneumonia shot- Once after the age of 65; if you are younger ask your healthcare provider if you need a pneumonia shot.  Tetanus- Every ten years.  Shingrix- Once after the age of 60 to prevent shingles.   Take these steps  Don't smoke- Your healthcare provider can help you quit. For tips on how to quit, ask your healthcare provider or go to www.smokefree.gov or call 1-800 QUIT-NOW.  Be physically active- Exercise 5 days a week for a minimum of 30 minutes.  If you are not already physically active, start slow and gradually work up to 30 minutes of moderate physical activity.  Try walking, dancing, bike riding, swimming, etc.  Eat a healthy diet- Eat a variety of healthy foods such as fruits, vegetables, whole grains, low fat milk, low fat cheeses, yogurt, lean meats, chicken, fish, eggs, dried beans, tofu, etc.  For more information go to www.thenutritionsource.org  Dental visit- Brush and floss teeth twice daily; visit your dentist twice a year.  Eye exam- Visit your Optometrist or Ophthalmologist yearly.  Drink alcohol in moderation- Limit alcohol intake to one drink or less a day.  Never drink and drive.  Depression- Your emotional health is as important as your physical health.  If you're feeling down or losing interest in things you normally enjoy, please talk to your healthcare provider.  Seat Belts- can save your life; always wear one  Smoke/Carbon Monoxide detectors- These detectors need to be installed on the appropriate level of  your home.  Replace batteries at least once a year.  Violence- If anyone is threatening or hurting you, please tell your healthcare provider.  Living Will/ Health care power of attorney- Discuss with your healthcare provider and family.   

## 2018-08-29 NOTE — Progress Notes (Signed)
Provider: Sharon SellerEubanks, Sianne Tejada K, NP  Patient Care Team: Sharon SellerEubanks, Unity Luepke K, NP as PCP - General (Geriatric Medicine) Kermit Baloeed, [name], DO as Consulting Physician (Geriatric Medicine)  Extended Emergency Contact Information Primary Emergency Contact: Gustavo LahWatkins,Joe Address: [address]          [place], [place] [id number] [place] Home Phone: [phone] Relation: Spouse No Known Allergies Code Status: FULL Goals of Care: Advanced Directive information Advanced Directives 01/17/2018  Does Patient Have a Medical Advance Directive? Yes  Type of Estate agentAdvance Directive Healthcare Power of LargoAttorney;Living will  Does patient want to make changes to medical advance directive? No - Patient declined  Copy of Healthcare Power of Attorney in Chart? No - copy requested  Would patient like information on creating a medical advance directive? -  Pre-existing out of facility DNR order (yellow form or pink MOST form) -     Chief Complaint  Patient presents with  . Annual Exam    Yearly physical   . Quality Metric Gaps    Discuss need for pap and eye exam   . Immunizations    Flu vaccine   . Labs Only    Discuss getting labs here vs at hospital following Gastric Bypass     HPI: Patient is a 63 y.o. female seen in today for an annual wellness exam.    Diet? Portion control due to gastric bypass. High protein low carb.   Exercise? Yoga at times, twice weekly is goal and walks dog.   Dentition:routine; she is a Education officer, communitydentist   Ophthalmology appt: due, see Dr Nile RiggsShapiro  Routine specialist: following with GI for colonoscopy, hematology due to iron def anemia after gastric bypass Due for mammogram.   [facility] 02/07/2018 11/21/2015 11/23/2014 10/22/2013  Decreased Interest 0 0 0 0  Down, Depressed, Hopeless 0 0 0 0  PHQ - 2 Score 0 0 0 0    Fall Risk  08/29/2018 02/07/2018 11/02/2016 12/19/2015 11/21/2015  Falls in the past year? 0 0 No Yes No  Number falls in past yr: 0 0  - 2 or more -  Injury with Fall? 0 0 - No -   No flowsheet data found.   Health Maintenance  Topic Date Due  . PAP SMEAR-Modifier  11/22/2017  . OPHTHALMOLOGY EXAM  03/09/2018  . HEMOGLOBIN A1C  08/08/2018  . FOOT EXAM  02/08/2019  . URINE MICROALBUMIN  02/08/2019  . MAMMOGRAM  04/13/2019  . COLONOSCOPY  08/08/2023  . TETANUS/TDAP  11/22/2024  . PNEUMOCOCCAL POLYSACCHARIDE VACCINE AGE 75-64 HIGH RISK  Completed  . Hepatitis C Screening  Completed  . HIV Screening  Completed    Past Medical History:  Diagnosis Date  . Anemia   . Chronic back pain   . Hypothyroidism     Past Surgical History:  Procedure Laterality Date  . CESAREAN SECTION  [id number],1991  . COLONOSCOPY N/A 08/07/2013   Procedure: COLONOSCOPY;  Surgeon: West BaliSandi L Fields, MD;  [facility];  Service: Endoscopy;  Laterality: N/A;  10:30-moved to [address] notified pt  . ESOPHAGOGASTRODUODENOSCOPY    . ESOPHAGOGASTRODUODENOSCOPY N/A 08/07/2013   Procedure: ESOPHAGOGASTRODUODENOSCOPY (EGD);  Surgeon: West BaliSandi L Fields, MD;  Location: [facility];  Service: Endoscopy;  Laterality: N/A;  . GASTRIC BYPASS  [address]2004   [place], [place]  . TONSILLECTOMY     Childhood  . UPPER GI ENDOSCOPY  2004   Dr.Borhanian     Social History   Socioeconomic History  .  Marital status: Married    Spouse name: Not on file  . Number of children: Not on file  . Years of education: Not on file  . Highest education level: Not on file  Occupational History  . Occupation: Environmental health practitionerDentist    Employer: [employer]  Social Needs  . Financial resource strain: Not on file  . Food insecurity    Worry: Not on file    Inability: Not on file  . Transportation needs    Medical: Not on file    Non-medical: Not on file  Tobacco Use  . Smoking status: Current Every Day Smoker    Packs/day: 0.50    Years: 10.00    Pack years: 5.00    Types: Cigarettes  . Smokeless tobacco: Never Used  . Tobacco comment: Relasped  Substance and  Sexual Activity  . Alcohol use: No    Alcohol/week: 0.0 standard drinks  . Drug use: No  . Sexual activity: Yes  Lifestyle  . Physical activity    Days per week: Not on file    Minutes per session: Not on file  . Stress: Not on file  Relationships  . Social Musicianconnections    Talks on phone: Not on file    Gets together: Not on file    Attends religious service: Not on file    Active member of club or organization: Not on file    Attends meetings of clubs or organizations: Not on file    Relationship status: Not on file  Other Topics Concern  . Not on file  Social History Narrative   Married, 1990, has 2 children. Patient lives in a multi-level home. Previously smoked 1/2 PPD, smoked for 10-20 years. Drinks a minimal amount of caffeinated beverages.     Family History  Problem Relation Age of Onset  . Heart disease Mother   . Diabetes Mother   . Stroke Mother   . Hypertension Mother   . Diabetes Father   . Heart disease Father   . Hypertension Father   . Migraines Daughter   . Colon cancer Neg Hx     Review of Systems:  Review of Systems  Constitutional: Negative for chills, fever and weight loss.  HENT: Negative for tinnitus.   Respiratory: Negative for cough, sputum production and shortness of breath.   Cardiovascular: Negative for chest pain, palpitations and leg swelling.  Gastrointestinal: Negative for abdominal pain, constipation, diarrhea and heartburn.  Genitourinary: Negative for dysuria, frequency and urgency.  Musculoskeletal: Positive for back pain (lumbar). Negative for falls, joint pain and myalgias.  Skin: Negative.   Neurological: Positive for headaches (controlled). Negative for dizziness. Loss of consciousness: c.  Psychiatric/Behavioral: Negative for depression and memory loss. The patient does not have insomnia.        Depression/anxiety in remission     Allergies as of 08/29/2018   No Known Allergies     Medication List       Accurate as of  August 29, 2018  3:35 PM. If you have any questions, ask your nurse or doctor.        acetaminophen 500 MG tablet Commonly known as: TYLENOL Take 500 mg by mouth every 6 (six) hours as needed for mild pain or headache.   cyanocobalamin 1000 MCG/ML injection Commonly known as: (VITAMIN B-12) Inject 1,000 mcg into the muscle every 30 (thirty) days.   escitalopram 10 MG tablet Commonly known as: LEXAPRO TAKE ONE TABLET BY MOUTH ONCE DAILY.   folic acid  1 MG tablet Commonly known as: FOLVITE TAKE (1) TABLET BY MOUTH ONCE DAILY.   levothyroxine 50 MCG tablet Commonly known as: SYNTHROID TAKE ONE TABLET BY MOUTH ONCE DAILY.   tiZANidine 2 MG tablet Commonly known as: ZANAFLEX Take 2 mg by mouth as needed (Migraine).   Vitamin D (Ergocalciferol) 1.25 MG (50000 UT) Caps capsule Commonly known as: DRISDOL TAKE 1 CAPSULE BY MOUTH ONCE A WEEK.   zonisamide 100 MG capsule Commonly known as: ZONEGRAN Take 200 mg by mouth daily.         Physical Exam: Vitals:   08/29/18 1500  BP: 126/78  Pulse: 67  Temp: 98.2 F (36.8 C)  TempSrc: Oral  SpO2: 98%  Weight: 196 lb 6.4 oz (89.1 kg)  Height: 5\' 9"  (1.753 m)   Body mass index is 29 kg/m. Wt Readings from Last 3 Encounters:  08/29/18 196 lb 6.4 oz (89.1 kg)  02/07/18 199 lb (90.3 kg)  01/17/18 201 lb 6.4 oz (91.4 kg)    Physical Exam Exam conducted with a chaperone present.  Constitutional:      General: She is not in acute distress.    Appearance: She is well-developed. She is not diaphoretic.  HENT:     Head: Normocephalic and atraumatic.     Mouth/Throat:     Pharynx: No oropharyngeal exudate.  Eyes:     Conjunctiva/sclera: Conjunctivae normal.     Pupils: Pupils are equal, round, and reactive to light.  Neck:     Musculoskeletal: Normal range of motion and neck supple.  Cardiovascular:     Rate and Rhythm: Normal rate and regular rhythm.     Pulses: Normal pulses.     Heart sounds: Normal heart sounds.   Pulmonary:     Effort: Pulmonary effort is normal.     Breath sounds: Normal breath sounds.  Abdominal:     General: Bowel sounds are normal.     Palpations: Abdomen is soft.  Genitourinary:    General: Normal vulva.     Urethra: No prolapse.     Vagina: Normal. No vaginal discharge.     Cervix: Normal.     Uterus: Normal.      Adnexa: Right adnexa normal and left adnexa normal.     Rectum: Normal.  Musculoskeletal: Normal range of motion.        General: No tenderness.  Skin:    General: Skin is warm and dry.     Capillary Refill: Capillary refill takes less than 2 seconds.  Neurological:     General: No focal deficit present.     Mental Status: She is alert and oriented to person, place, and time.  Psychiatric:        Mood and Affect: Mood normal.     Labs reviewed: Basic Metabolic Panel: Recent Labs    12/11/17 0907 02/07/18 0914  NA 140 142  K 3.4* 4.4  CL 112* 110  CO2 24 27  GLUCOSE 74 78  BUN 13 18  CREATININE 1.04* 0.92  CALCIUM 8.5* 9.1  TSH  --  2.63   Liver Function Tests: Recent Labs    12/11/17 0907  AST 18  ALT 14  ALKPHOS 77  BILITOT 0.5  PROT 6.2*  ALBUMIN 3.5   No results for input(s): LIPASE, AMYLASE in the last 8760 hours. No results for input(s): AMMONIA in the last 8760 hours. CBC: Recent Labs    09/13/17 1003 12/11/17 0907  WBC 4.6 3.8*  NEUTROABS 2.9 2.5  HGB 11.3* 11.6*  HCT 34.9* 36.7  MCV 102.0* 103.1*  PLT 265 283   Lipid Panel: Recent Labs    02/07/18 0914  CHOL 194  HDL 61  LDLCALC 114*  TRIG 88  CHOLHDL 3.2   Lab Results  Component Value Date   HGBA1C 5.2 02/07/2018    Procedures: No results found.  Assessment/Plan 1. Cervical cancer screening - PAP, Image Guided [[facility]]  2. Hyperlipidemia, unspecified hyperlipidemia type -conts on dietary modifications  - EKG 12-Lead - COMPLETE METABOLIC PANEL WITH GFR - Lipid Panel  3. Preventative health care -plans to get eye exam and mammogram,   The patient was counseled regarding the appropriate use of alcohol, regular self-examination of the breasts on a monthly basis, prevention of dental and periodontal disease, diet, regular sustained exercise for at least 30 minutes 5 times per week, routine screening interval for mammogram as recommended by the [place] and ACOG, importance of regular PAP smears, smoking cessation, tobacco use,  and recommended schedule for GI hemoccult testing, colonoscopy, cholesterol, thyroid and diabetes screening.   4. Postoperative malabsorption Following with hematology, will obtain labs today for them. - Hemoglobin A1c - Vitamin D, 25-hydroxy - Folate - Vitamin B12 - Iron, TIBC and Ferritin Panel - COMPLETE METABOLIC PANEL WITH GFR - CBC with Differential/Platelet  5. Vitamin D deficiency -continues on supplement - Vitamin D, 25-hydroxy  6. Hypothyroidism, unspecified type Continues on synthroid 50 mcg - TSH  7. Type 2 diabetes mellitus without complication, without long-term current use of insulin (HCC) -diet controlled - Hemoglobin A1c  8. Need for influenza vaccination - Flu Vaccine QUAD 6+ mos PF IM (Fluarix Quad PF)  9. H/O gastric bypass -continues to do low portions and eats protein first, continues on supplements and following with hematology for malabsorption after surgery   10. Low folate Continues on folate supplement  Next appt: 6 months for routine follow up [place]. [place] [phone]

## 2018-08-30 LAB — IRON,TIBC AND FERRITIN PANEL
%SAT: 29 % (ref 16–45)
Ferritin: 171 ng/mL (ref 16–288)
Iron: 69 ug/dL (ref 45–160)
TIBC: 240 ug/dL — ABNORMAL LOW (ref 250–450)

## 2018-08-30 LAB — HEMOGLOBIN A1C
Hgb A1c MFr Bld: 5.4 % of total Hgb (ref <5.7)
Mean Plasma Glucose: 108 (calc)
eAG (mmol/L): 6 (calc)

## 2018-08-30 LAB — CBC WITH DIFFERENTIAL/PLATELET
Absolute Monocytes: 360 {cells}/uL (ref 200–950)
Basophils Absolute: 21 {cells}/uL (ref 0–200)
Basophils Relative: 0.4 %
Eosinophils Absolute: 90 {cells}/uL (ref 15–500)
Eosinophils Relative: 1.7 %
HCT: 35.2 % (ref 35.0–45.0)
Hemoglobin: 11.8 g/dL (ref 11.7–15.5)
Lymphs Abs: 1770 {cells}/uL (ref 850–3900)
MCH: 33.5 pg — ABNORMAL HIGH (ref 27.0–33.0)
MCHC: 33.5 g/dL (ref 32.0–36.0)
MCV: 100 fL (ref 80.0–100.0)
MPV: 10.5 fL (ref 7.5–12.5)
Monocytes Relative: 6.8 %
Neutro Abs: 3058 {cells}/uL (ref 1500–7800)
Neutrophils Relative %: 57.7 %
Platelets: 307 Thousand/uL (ref 140–400)
RBC: 3.52 Million/uL — ABNORMAL LOW (ref 3.80–5.10)
RDW: 12 % (ref 11.0–15.0)
Total Lymphocyte: 33.4 %
WBC: 5.3 Thousand/uL (ref 3.8–10.8)

## 2018-08-30 LAB — COMPLETE METABOLIC PANEL WITHOUT GFR
AG Ratio: 2 (calc) (ref 1.0–2.5)
ALT: 10 U/L (ref 6–29)
AST: 11 U/L (ref 10–35)
Albumin: 3.8 g/dL (ref 3.6–5.1)
Alkaline phosphatase (APISO): 82 U/L (ref 37–153)
BUN/Creatinine Ratio: 13 (calc) (ref 6–22)
BUN: 15 mg/dL (ref 7–25)
CO2: 26 mmol/L (ref 20–32)
Calcium: 8.7 mg/dL (ref 8.6–10.4)
Chloride: 112 mmol/L — ABNORMAL HIGH (ref 98–110)
Creat: 1.19 mg/dL — ABNORMAL HIGH (ref 0.50–0.99)
GFR, Est African American: 57 mL/min/1.73m2 — ABNORMAL LOW
GFR, Est Non African American: 49 mL/min/1.73m2 — ABNORMAL LOW
Globulin: 1.9 g/dL (ref 1.9–3.7)
Glucose, Bld: 62 mg/dL — ABNORMAL LOW (ref 65–139)
Potassium: 4 mmol/L (ref 3.5–5.3)
Sodium: 143 mmol/L (ref 135–146)
Total Bilirubin: 0.2 mg/dL (ref 0.2–1.2)
Total Protein: 5.7 g/dL — ABNORMAL LOW (ref 6.1–8.1)

## 2018-08-30 LAB — FOLATE: Folate: 12.2 ng/mL

## 2018-08-30 LAB — VITAMIN D 25 HYDROXY (VIT D DEFICIENCY, FRACTURES): Vit D, 25-Hydroxy: 30 ng/mL (ref 30–100)

## 2018-08-30 LAB — LIPID PANEL
Cholesterol: 179 mg/dL (ref <200)
HDL: 62 mg/dL (ref >=50)
LDL Cholesterol (Calc): 95 mg/dL (calc)
Non-HDL Cholesterol (Calc): 117 mg/dL (calc) (ref <130)
Total CHOL/HDL Ratio: 2.9 (calc) (ref <5.0)
Triglycerides: 127 mg/dL (ref <150)

## 2018-08-30 LAB — TSH: TSH: 3.04 m[IU]/L (ref 0.40–4.50)

## 2018-08-30 LAB — VITAMIN B12: Vitamin B-12: 402 pg/mL (ref 200–1100)

## 2018-09-01 LAB — PAP IG (IMAGE GUIDED)

## 2018-09-05 DIAGNOSIS — F4323 Adjustment disorder with mixed anxiety and depressed mood: Secondary | ICD-10-CM | POA: Diagnosis not present

## 2018-09-12 ENCOUNTER — Encounter (HOSPITAL_COMMUNITY): Payer: Self-pay | Admitting: Hematology

## 2018-09-12 ENCOUNTER — Other Ambulatory Visit: Payer: Self-pay

## 2018-09-12 ENCOUNTER — Inpatient Hospital Stay (HOSPITAL_COMMUNITY): Payer: BC Managed Care – PPO | Attending: Hematology | Admitting: Hematology

## 2018-09-12 DIAGNOSIS — E538 Deficiency of other specified B group vitamins: Secondary | ICD-10-CM | POA: Insufficient documentation

## 2018-09-12 DIAGNOSIS — E039 Hypothyroidism, unspecified: Secondary | ICD-10-CM | POA: Diagnosis not present

## 2018-09-12 DIAGNOSIS — Z9884 Bariatric surgery status: Secondary | ICD-10-CM | POA: Insufficient documentation

## 2018-09-12 DIAGNOSIS — D508 Other iron deficiency anemias: Secondary | ICD-10-CM

## 2018-09-12 DIAGNOSIS — K909 Intestinal malabsorption, unspecified: Secondary | ICD-10-CM | POA: Diagnosis not present

## 2018-09-12 DIAGNOSIS — E559 Vitamin D deficiency, unspecified: Secondary | ICD-10-CM | POA: Diagnosis not present

## 2018-09-12 DIAGNOSIS — D509 Iron deficiency anemia, unspecified: Secondary | ICD-10-CM | POA: Diagnosis not present

## 2018-09-20 ENCOUNTER — Other Ambulatory Visit (HOSPITAL_COMMUNITY): Payer: Self-pay | Admitting: Nurse Practitioner

## 2018-09-23 NOTE — Assessment & Plan Note (Addendum)
1. Iron deficiency anemia: - Secondary to malabsorption from gastric bypass surgery.  She is on intermittent Feraheme infusions, last one on 09/28/2016 and 10/12/2016. - Colonoscopy on 08/07/2013 with a large redundant colon and internal/external hemorrhoids. -EGD on 08/07/2013 with a Schatzki ring at GE junction and a small clean-based ulcer at anastomosis. -Last Feraheme was on 06/21/2017.  Her tiredness improved after that. - Labs from August 29, 2018 revealed stable hemoglobin 11.8, iron 69, TIBC 240 saturation 29, and ferritin level at 171.  The patient does not require any parenteral iron at this time.  We will continue to monitor her labs. - Return to clinic in 6 months with labs prior.   2.  B12 deficiency: -Secondary to malabsorption from gastric bypass surgery. -She is taking B12 injections subcu every 3 to 4 weeks at home.  She also takes sublingual B12 tablet daily.  3.  Vitamin D deficiency: - She is taking 50,000 units weekly.  Vitamin D level is stable.

## 2018-09-23 NOTE — Progress Notes (Signed)
[place], [place] [id number]   CLINIC:  Medical Oncology/Hematology  PCP:  Lauree Chandler, NP [place] [address] [phone]   REASON FOR VISIT:  Follow-up for IDA   CURRENT THERAPY: Clinical Surveillance     INTERVAL HISTORY:  April [name] 63 y.o. female presents today for follow up. She reports overall doing well. Denies any significant fatigue. Denies any obvious signs of bleeding. Denies any SOB, CP, lightheadedness or dizziness. No change in appetite. No change in bowel habits. Weight is stable. She is here for repeat labs and office visit.   REVIEW OF SYSTEMS:  Review of Systems  Constitutional: Negative.   HENT:  Negative.   Eyes: Negative.   Respiratory: Negative.   Cardiovascular: Negative.   Gastrointestinal: Negative.  Negative for abdominal distention.  Endocrine: Negative.   Genitourinary: Negative.    Musculoskeletal: Negative.   Skin: Negative.   Neurological: Negative.   Hematological: Negative.   Psychiatric/Behavioral: Negative.      PAST MEDICAL/SURGICAL HISTORY:  Past Medical History:  Diagnosis Date  . Anemia   . Chronic back pain   . Hypothyroidism    Past Surgical History:  Procedure Laterality Date  . CESAREAN SECTION  [id number]  . COLONOSCOPY N/A 08/07/2013   Procedure: COLONOSCOPY;  Surgeon: Danie Binder, MD;  [facility];  Service: Endoscopy;  Laterality: N/A;  10:30-moved to [place] notified pt  . ESOPHAGOGASTRODUODENOSCOPY    . ESOPHAGOGASTRODUODENOSCOPY N/A 08/07/2013   Procedure: ESOPHAGOGASTRODUODENOSCOPY (EGD);  Surgeon: Danie Binder, MD;  [facility];  Service: Endoscopy;  Laterality: N/A;  . GASTRIC BYPASS  [address] [place]  . TONSILLECTOMY     Childhood  . UPPER GI ENDOSCOPY  2004   Dr.Borhanian      SOCIAL HISTORY:  Social History   Socioeconomic History  . Marital status: Married    Spouse name: Not on file  .  Number of children: Not on file  . Years of education: Not on file  . Highest education level: Not on file  Occupational History  . Occupation: Engineer, site: [employer]  . Financial resource strain: Not on file  . Food insecurity    Worry: Not on file    Inability: Not on file  . Transportation needs    Medical: Not on file    Non-medical: Not on file  Tobacco Use  . Smoking status: Current Every Day Smoker    Packs/day: 0.50    Years: 10.00    Pack years: 5.00    Types: Cigarettes  . Smokeless tobacco: Never Used  . Tobacco comment: Relasped  Substance and Sexual Activity  . Alcohol use: No    Alcohol/week: 0.0 standard drinks  . Drug use: No  . Sexual activity: Yes  Lifestyle  . Physical activity    Days per week: Not on file    Minutes per session: Not on file  . Stress: Not on file  Relationships  . Social Herbalist on phone: Not on file    Gets together: Not on file    Attends religious service: Not on file    Active member of club or organization: Not on file    Attends meetings of clubs or organizations: Not on file    Relationship status: Not on file  . Intimate partner violence    Fear of current  or ex partner: Not on file    Emotionally abused: Not on file    Physically abused: Not on file    Forced sexual activity: Not on file  Other Topics Concern  . Not on file  Social History Narrative   Married, 1990, has 2 children. Patient lives in a multi-level home. Previously smoked 1/2 PPD, smoked for 10-20 years. Drinks a minimal amount of caffeinated beverages.     FAMILY HISTORY:  Family History  Problem Relation Age of Onset  . Heart disease Mother   . Diabetes Mother   . Stroke Mother   . Hypertension Mother   . Diabetes Father   . Heart disease Father   . Hypertension Father   . Migraines Daughter   . Colon cancer Neg Hx     CURRENT MEDICATIONS:  Outpatient Encounter Medications as of 09/12/2018   Medication Sig  . escitalopram (LEXAPRO) 10 MG tablet TAKE ONE TABLET BY MOUTH ONCE DAILY.  . folic acid (FOLVITE) 1 MG tablet TAKE (1) TABLET BY MOUTH ONCE DAILY.  Marland Kitchen. levothyroxine (SYNTHROID) 50 MCG tablet TAKE ONE TABLET BY MOUTH ONCE DAILY.  Marland Kitchen. Vitamin D, Ergocalciferol, (DRISDOL) 1.25 MG (50000 UT) CAPS capsule TAKE 1 CAPSULE BY MOUTH ONCE A WEEK.  Marland Kitchen. zonisamide (ZONEGRAN) 100 MG capsule Take 200 mg by mouth daily.   . [DISCONTINUED] cyanocobalamin (,VITAMIN B-12,) 1000 MCG/ML injection Inject 1,000 mcg into the muscle every 30 (thirty) days.  Marland Kitchen. acetaminophen (TYLENOL) 500 MG tablet Take 500 mg by mouth every 6 (six) hours as needed for mild pain or headache.  Marland Kitchen. tiZANidine (ZANAFLEX) 2 MG tablet Take 2 mg by mouth as needed (Migraine).   No facility-administered encounter medications on file as of 09/12/2018.     ALLERGIES:  No Known Allergies   PHYSICAL EXAM:  ECOG Performance status: 1  Vitals:   09/12/18 1154  BP: 123/70  Pulse: 75  Resp: 16  Temp: (!) 97.5 F (36.4 C)  SpO2: 100%   Filed Weights   09/12/18 1154  Weight: 195 lb (88.5 kg)    Physical Exam Constitutional:      Appearance: Normal appearance. She is obese.  HENT:     Head: Normocephalic.     Right Ear: External ear normal.     Left Ear: External ear normal.     Nose: Nose normal.     Mouth/Throat:     Mouth: Mucous membranes are moist.     Pharynx: Oropharynx is clear.  Eyes:     Conjunctiva/sclera: Conjunctivae normal.  Neck:     Musculoskeletal: Normal range of motion.  Cardiovascular:     Rate and Rhythm: Normal rate and regular rhythm.     Pulses: Normal pulses.     Heart sounds: Normal heart sounds.  Pulmonary:     Effort: Pulmonary effort is normal.     Breath sounds: Normal breath sounds.  Abdominal:     General: Bowel sounds are normal.  Musculoskeletal: Normal range of motion.  Skin:    General: Skin is warm.  Neurological:     General: No focal deficit present.     Mental  Status: She is alert and oriented to person, place, and time.  Psychiatric:        Mood and Affect: Mood normal.        Behavior: Behavior normal.        Thought Content: Thought content normal.        Judgment: Judgment normal.  LABORATORY DATA:  I have reviewed the labs as listed.  CBC    Component Value Date/Time   WBC 5.3 08/29/2018 1558   RBC 3.52 (L) 08/29/2018 1558   HGB 11.8 08/29/2018 1558   HCT 35.2 08/29/2018 1558   PLT 307 08/29/2018 1558   MCV 100.0 08/29/2018 1558   MCH 33.5 (H) 08/29/2018 1558   MCHC 33.5 08/29/2018 1558   RDW 12.0 08/29/2018 1558   LYMPHSABS 1,770 08/29/2018 1558   MONOABS 0.3 12/11/2017 0907   EOSABS 90 08/29/2018 1558   BASOSABS 21 08/29/2018 1558   CMP Latest Ref Rng & Units 08/29/2018 02/07/2018 12/11/2017  Glucose 65 - 139 mg/dL 60(V) 78 74  BUN 7 - 25 mg/dL 15 18 13   Creatinine 0.50 - 0.99 mg/dL 3.71(G) 6.26 9.48(N)  Sodium 135 - 146 mmol/L 143 142 140  Potassium 3.5 - 5.3 mmol/L 4.0 4.4 3.4(L)  Chloride 98 - 110 mmol/L 112(H) 110 112(H)  CO2 20 - 32 mmol/L 26 27 24   Calcium 8.6 - 10.4 mg/dL 8.7 9.1 4.6(E)  Total Protein 6.1 - 8.1 g/dL 7.0(J) - 6.2(L)  Total Bilirubin 0.2 - 1.2 mg/dL 0.2 - 0.5  Alkaline Phos 38 - 126 U/L - - 77  AST 10 - 35 U/L 11 - 18  ALT 6 - 29 U/L 10 - 14       ASSESSMENT & PLAN:   Iron deficiency anemia 1. Iron deficiency anemia: - Secondary to malabsorption from gastric bypass surgery.  She is on intermittent Feraheme infusions, last one on 09/28/2016 and 10/12/2016. - Colonoscopy on 08/07/2013 with a large redundant colon and internal/external hemorrhoids. -EGD on 08/07/2013 with a Schatzki ring at GE junction and a small clean-based ulcer at anastomosis. -Last Feraheme was on 06/21/2017.  Her tiredness improved after that. - Labs from August 29, 2018 revealed stable hemoglobin 11.8, iron 69, TIBC 240 saturation 29, and ferritin level at 171.  The patient does not require any parenteral iron at this  time.  We will continue to monitor her labs. - Return to clinic in 6 months with labs prior.   2.  B12 deficiency: -Secondary to malabsorption from gastric bypass surgery. -She is taking B12 injections subcu every 3 to 4 weeks at home.  She also takes sublingual B12 tablet daily.  3.  Vitamin D deficiency: - She is taking 50,000 units weekly.  Vitamin D level is stable.       Orders placed this encounter:  Orders Placed This Encounter  Procedures  . CBC with Differential  . Comprehensive metabolic panel  . Iron and TIBC  . Ferritin  . Vitamin B12  . Folate  . Vitamin D 25 hydroxy      Jinny Sanders, FNP  [facility] [phone]

## 2018-09-26 ENCOUNTER — Ambulatory Visit (HOSPITAL_COMMUNITY)
Admission: RE | Admit: 2018-09-26 | Discharge: 2018-09-26 | Disposition: A | Discharge status: Home / Self Care | Payer: BC Managed Care – PPO | Source: Ambulatory Visit | Attending: Nurse Practitioner | Admitting: Nurse Practitioner

## 2018-09-26 ENCOUNTER — Other Ambulatory Visit: Payer: Self-pay

## 2018-09-26 DIAGNOSIS — Z1231 Encounter for screening mammogram for malignant neoplasm of breast: Secondary | ICD-10-CM | POA: Insufficient documentation

## 2018-10-10 DIAGNOSIS — F4323 Adjustment disorder with mixed anxiety and depressed mood: Secondary | ICD-10-CM | POA: Diagnosis not present

## 2018-10-17 DIAGNOSIS — M542 Cervicalgia: Secondary | ICD-10-CM | POA: Diagnosis not present

## 2018-10-17 DIAGNOSIS — G43111 Migraine with aura, intractable, with status migrainosus: Secondary | ICD-10-CM | POA: Diagnosis not present

## 2018-10-17 DIAGNOSIS — G43719 Chronic migraine without aura, intractable, without status migrainosus: Secondary | ICD-10-CM | POA: Diagnosis not present

## 2018-11-14 DIAGNOSIS — F4323 Adjustment disorder with mixed anxiety and depressed mood: Secondary | ICD-10-CM | POA: Diagnosis not present

## 2018-12-02 ENCOUNTER — Other Ambulatory Visit (HOSPITAL_COMMUNITY): Payer: Self-pay | Admitting: Nurse Practitioner

## 2018-12-13 DIAGNOSIS — F4323 Adjustment disorder with mixed anxiety and depressed mood: Secondary | ICD-10-CM | POA: Diagnosis not present

## 2019-01-10 DIAGNOSIS — F4323 Adjustment disorder with mixed anxiety and depressed mood: Secondary | ICD-10-CM | POA: Diagnosis not present

## 2019-01-16 DIAGNOSIS — M542 Cervicalgia: Secondary | ICD-10-CM | POA: Diagnosis not present

## 2019-01-16 DIAGNOSIS — G43719 Chronic migraine without aura, intractable, without status migrainosus: Secondary | ICD-10-CM | POA: Diagnosis not present

## 2019-01-16 DIAGNOSIS — G43111 Migraine with aura, intractable, with status migrainosus: Secondary | ICD-10-CM | POA: Diagnosis not present

## 2019-01-17 DIAGNOSIS — Z23 Encounter for immunization: Secondary | ICD-10-CM | POA: Diagnosis not present

## 2019-02-07 DIAGNOSIS — F4323 Adjustment disorder with mixed anxiety and depressed mood: Secondary | ICD-10-CM | POA: Diagnosis not present

## 2019-02-14 DIAGNOSIS — Z23 Encounter for immunization: Secondary | ICD-10-CM | POA: Diagnosis not present

## 2019-02-28 ENCOUNTER — Other Ambulatory Visit (HOSPITAL_COMMUNITY): Payer: Self-pay | Admitting: Nurse Practitioner

## 2019-03-05 ENCOUNTER — Encounter (HOSPITAL_COMMUNITY): Payer: Self-pay | Admitting: Nurse Practitioner

## 2019-03-06 ENCOUNTER — Other Ambulatory Visit: Payer: Self-pay

## 2019-03-06 ENCOUNTER — Ambulatory Visit (INDEPENDENT_AMBULATORY_CARE_PROVIDER_SITE_OTHER): Payer: BC Managed Care – PPO | Admitting: Nurse Practitioner

## 2019-03-06 ENCOUNTER — Encounter: Payer: Self-pay | Admitting: Nurse Practitioner

## 2019-03-06 VITALS — BP 122/78 | HR 56 | Temp 97.3°F | Ht 69.0 in | Wt 188.0 lb

## 2019-03-06 DIAGNOSIS — K912 Postsurgical malabsorption, not elsewhere classified: Secondary | ICD-10-CM

## 2019-03-06 DIAGNOSIS — F325 Major depressive disorder, single episode, in full remission: Secondary | ICD-10-CM

## 2019-03-06 DIAGNOSIS — K909 Intestinal malabsorption, unspecified: Secondary | ICD-10-CM

## 2019-03-06 DIAGNOSIS — E785 Hyperlipidemia, unspecified: Secondary | ICD-10-CM

## 2019-03-06 DIAGNOSIS — E119 Type 2 diabetes mellitus without complications: Secondary | ICD-10-CM | POA: Diagnosis not present

## 2019-03-06 DIAGNOSIS — E538 Deficiency of other specified B group vitamins: Secondary | ICD-10-CM | POA: Diagnosis not present

## 2019-03-06 DIAGNOSIS — E559 Vitamin D deficiency, unspecified: Secondary | ICD-10-CM

## 2019-03-06 DIAGNOSIS — E034 Atrophy of thyroid (acquired): Secondary | ICD-10-CM

## 2019-03-06 DIAGNOSIS — F17200 Nicotine dependence, unspecified, uncomplicated: Secondary | ICD-10-CM

## 2019-03-06 NOTE — Progress Notes (Signed)
Careteam: Patient Care Team: Lauree Chandler, NP as PCP - General (Geriatric Medicine) Gayland Curry, DO as Consulting Physician (Geriatric Medicine) Derek Jack, MD as Consulting Physician (Hematology)  Advanced Directive information Does Patient Have a Medical Advance Directive?: Yes, Type of Advance Directive: [place];Living will, Does patient want to make changes to medical advance directive?: No - Patient declined  No Known Allergies  Chief Complaint  Patient presents with  . Medical Management of Chronic Issues    6 month follow-up  . Labs Only    Discuss labs for gastric bypass follow-up, see orders in Epic   . Quality Metric Gaps    Discuss need for eye exam, foot exam, MALB and A1c      HPI: Patient is a 64 y.o. female for routine follow up.   Iron def anemia- due to malabsorption from gastric bypass surgery  b12 def- due to malabsorption from gastric bypass  Weight loss- continues to lose weight after gastric bypass- her weight was at 185 lbs. She feels well.   Vit d def- taking 50,000 units weekly   DM- needs eye appt, doctor not in network. Due for urine micro. hgb a1c at goal since weight loss. Diet controlled.   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   Anxiety and depression- controlled on lexapro  Trying to quit smoking- down to 2-3 cigarettes from 1/2 ppd  Review of Systems:  Review of Systems  Constitutional: Negative for chills, fever and weight loss.  HENT: Negative for tinnitus.   Respiratory: Negative for cough, sputum production and shortness of breath.   Cardiovascular: Negative for chest pain, palpitations and leg swelling.  Gastrointestinal: Negative for abdominal pain, constipation, diarrhea and heartburn.  Genitourinary: Negative  for dysuria, frequency and urgency.  Musculoskeletal: Positive for back pain (low back). Negative for falls, joint pain and myalgias.  Skin: Negative.   Neurological: Negative for dizziness and headaches.  Psychiatric/Behavioral: Negative for depression. The patient does not have insomnia.     Past Medical History:  Diagnosis Date  . Anemia   . Chronic back pain   . Hypothyroidism    Past Surgical History:  Procedure Laterality Date  . CESAREAN SECTION  [id number]  . COLONOSCOPY N/A 08/07/2013   Procedure: COLONOSCOPY;  Surgeon: Danie Binder, MD;  [facility];  Service: Endoscopy;  Laterality: N/A;  10:30-moved to [place] notified pt  . ESOPHAGOGASTRODUODENOSCOPY    . ESOPHAGOGASTRODUODENOSCOPY N/A 08/07/2013   Procedure: ESOPHAGOGASTRODUODENOSCOPY (EGD);  Surgeon: Danie Binder, MD;  [facility];  Service: Endoscopy;  Laterality: N/A;  . GASTRIC BYPASS  [address] [place]  . TONSILLECTOMY     Childhood  . UPPER GI ENDOSCOPY  2004   Dr.Borhanian    Social History:   reports that she has been smoking cigarettes. She has a 5.00 pack-year smoking history. She has never used smokeless tobacco. She reports that she does not drink alcohol or use drugs.  Family History  Problem Relation Age of Onset  . Heart disease Mother   . Diabetes Mother   . Stroke Mother   . Hypertension Mother   . Diabetes Father   . Heart disease Father   . Hypertension Father   . Migraines Daughter   .  Colon cancer Neg Hx     Medications: Patient's Medications  New Prescriptions   No medications on file  Previous Medications   ACETAMINOPHEN (TYLENOL) 500 MG TABLET    Take 500 mg by mouth every 6 (six) hours as needed for mild pain or headache.   CYANOCOBALAMIN (,VITAMIN B-12,) 1000 MCG/ML INJECTION    INJECT INTO THE MUSCLE EVERY 21 DAYS.   ESCITALOPRAM (LEXAPRO) 10 MG TABLET    TAKE ONE TABLET BY MOUTH ONCE DAILY.   FOLIC ACID (FOLVITE) 1 MG TABLET    TAKE (1)  TABLET BY MOUTH ONCE DAILY.   LEVOTHYROXINE (SYNTHROID) 50 MCG TABLET    TAKE ONE TABLET BY MOUTH ONCE DAILY.   TIZANIDINE (ZANAFLEX) 2 MG TABLET    Take 2 mg by mouth as needed (Migraine).   VITAMIN D, ERGOCALCIFEROL, (DRISDOL) 1.25 MG (50000 UT) CAPS CAPSULE    TAKE 1 CAPSULE BY MOUTH ONCE A WEEK.   ZONISAMIDE (ZONEGRAN) 100 MG CAPSULE    Take 200 mg by mouth daily.   Modified Medications   No medications on file  Discontinued Medications   No medications on file    Physical Exam:  Vitals:   03/06/19 0858  BP: 122/78  Pulse: (!) 56  Temp: (!) 97.3 F (36.3 C)  TempSrc: Temporal  SpO2: 99%  Weight: 188 lb (85.3 kg)  Height: 5\' 9"  (1.753 m)   Body mass index is 27.76 kg/m. Wt Readings from Last 3 Encounters:  03/06/19 188 lb (85.3 kg)  09/12/18 195 lb (88.5 kg)  08/29/18 196 lb 6.4 oz (89.1 kg)    Physical Exam Constitutional:      General: She is not in acute distress.    Appearance: She is well-developed. She is not diaphoretic.  HENT:     Head: Normocephalic and atraumatic.     Mouth/Throat:     Pharynx: No oropharyngeal exudate.  Eyes:     Conjunctiva/sclera: Conjunctivae normal.     Pupils: Pupils are equal, round, and reactive to light.  Cardiovascular:     Rate and Rhythm: Normal rate and regular rhythm.     Heart sounds: Normal heart sounds.  Pulmonary:     Effort: Pulmonary effort is normal.     Breath sounds: Normal breath sounds.  Abdominal:     General: Bowel sounds are normal.     Palpations: Abdomen is soft.  Musculoskeletal:        General: No tenderness.     Cervical back: Normal range of motion and neck supple.  Skin:    General: Skin is warm and dry.  Neurological:     Mental Status: She is alert and oriented to person, place, and time.     Labs reviewed: Basic Metabolic Panel: Recent Labs    08/29/18 1558  NA 143  K 4.0  CL 112*  CO2 26  GLUCOSE 62*  BUN 15  CREATININE 1.19*  CALCIUM 8.7  TSH 3.04   Liver Function  Tests: Recent Labs    08/29/18 1558  AST 11  ALT 10  BILITOT 0.2  PROT 5.7*   No results for input(s): LIPASE, AMYLASE in the last 8760 hours. No results for input(s): AMMONIA in the last 8760 hours. CBC: Recent Labs    08/29/18 1558  WBC 5.3  NEUTROABS 3,058  HGB 11.8  HCT 35.2  MCV 100.0  PLT 307   Lipid Panel: Recent Labs    08/29/18 1558  CHOL 179  HDL 62  LDLCALC  95  TRIG 127  CHOLHDL 2.9   TSH: Recent Labs    08/29/18 1558  TSH 3.04   A1C: Lab Results  Component Value Date   HGBA1C 5.4 08/29/2018     Assessment/Plan 1. Vitamin D deficiency Continues on supplement - Vitamin D, 25-hydroxy  2. Major depression, single episode, in complete remission (HCC) In remission on lexapro  3. Type 2 diabetes mellitus without complication, without long-term current use of insulin (HCC) Diet controlled. Encouraged to get ophthalmology follow up.  - Microalbumin, urine - Hemoglobin A1c  4. Postoperative malabsorption S/p gastric bypass.  - CBC with Differential/Platelet - Iron, TIBC and Ferritin Panel - B12 and Folate Panel  5. Malabsorption of iron -continues to follow up with hematology, if ferritin low gets transfusion.  - CBC with Differential/Platelet - Iron, TIBC and Ferritin Panel  6. B12 deficiency -continues on supplement - B12 and Folate Panel  7. Low folate -continues on supplement - B12 and Folate Panel  8. Hyperlipidemia, unspecified hyperlipidemia type Diet controlled  - COMPLETE METABOLIC PANEL WITH GFR - Lipid Panel  9. Hypothyroidism due to acquired atrophy of thyroid -continues on synthroid 50 mcg - TSH  10. Smoker After husband got sick they both have attempted to quit smoking, did very well for a few months but then restarted, she is down to 2-3 a day from 1/2 pack. Encouraged to keep up the good work and continue to work towards cessation.  Next appt: 6 months for routine follow up Vernessa Likes K. Biagio Borg  [facility] [phone]

## 2019-03-07 LAB — CBC WITH DIFFERENTIAL/PLATELET
Absolute Monocytes: 298 cells/uL (ref 200–950)
Basophils Absolute: 38 cells/uL (ref 0–200)
Basophils Relative: 0.9 %
Eosinophils Absolute: 101 cells/uL (ref 15–500)
Eosinophils Relative: 2.4 %
HCT: 34.6 % — ABNORMAL LOW (ref 35.0–45.0)
Hemoglobin: 11.8 g/dL (ref 11.7–15.5)
Lymphs Abs: 1319 cells/uL (ref 850–3900)
MCH: 33.3 pg — ABNORMAL HIGH (ref 27.0–33.0)
MCHC: 34.1 g/dL (ref 32.0–36.0)
MCV: 97.7 fL (ref 80.0–100.0)
MPV: 10.6 fL (ref 7.5–12.5)
Monocytes Relative: 7.1 %
Neutro Abs: 2444 cells/uL (ref 1500–7800)
Neutrophils Relative %: 58.2 %
Platelets: 287 10*3/uL (ref 140–400)
RBC: 3.54 10*6/uL — ABNORMAL LOW (ref 3.80–5.10)
RDW: 11.8 % (ref 11.0–15.0)
Total Lymphocyte: 31.4 %
WBC: 4.2 10*3/uL (ref 3.8–10.8)

## 2019-03-07 LAB — IRON,TIBC AND FERRITIN PANEL
%SAT: 54 % (calc) — ABNORMAL HIGH (ref 16–45)
Ferritin: 166 ng/mL (ref 16–288)
Iron: 142 ug/dL (ref 45–160)
TIBC: 264 mcg/dL (calc) (ref 250–450)

## 2019-03-07 LAB — COMPLETE METABOLIC PANEL WITH GFR
AG Ratio: 2 (calc) (ref 1.0–2.5)
ALT: 14 U/L (ref 6–29)
AST: 14 U/L (ref 10–35)
Albumin: 3.8 g/dL (ref 3.6–5.1)
Alkaline phosphatase (APISO): 85 U/L (ref 37–153)
BUN: 15 mg/dL (ref 7–25)
CO2: 27 mmol/L (ref 20–32)
Calcium: 8.8 mg/dL (ref 8.6–10.4)
Chloride: 109 mmol/L (ref 98–110)
Creat: 0.84 mg/dL (ref 0.50–0.99)
GFR, Est African American: 86 mL/min/{1.73_m2} (ref >=60)
GFR, Est Non African American: 74 mL/min/{1.73_m2} (ref >=60)
Globulin: 1.9 g/dL (calc) (ref 1.9–3.7)
Glucose, Bld: 78 mg/dL (ref 65–99)
Potassium: 4.3 mmol/L (ref 3.5–5.3)
Sodium: 141 mmol/L (ref 135–146)
Total Bilirubin: 0.4 mg/dL (ref 0.2–1.2)
Total Protein: 5.7 g/dL — ABNORMAL LOW (ref 6.1–8.1)

## 2019-03-07 LAB — TSH: TSH: 3.69 mIU/L (ref 0.40–4.50)

## 2019-03-07 LAB — LIPID PANEL
Cholesterol: 181 mg/dL (ref <200)
HDL: 66 mg/dL (ref >=50)
LDL Cholesterol (Calc): 99 mg/dL (calc)
Non-HDL Cholesterol (Calc): 115 mg/dL (calc) (ref <130)
Total CHOL/HDL Ratio: 2.7 (calc) (ref <5.0)
Triglycerides: 75 mg/dL (ref <150)

## 2019-03-07 LAB — MICROALBUMIN, URINE: Microalb, Ur: <0.2 mg/dL

## 2019-03-07 LAB — HEMOGLOBIN A1C
Hgb A1c MFr Bld: 5.3 % of total Hgb (ref <5.7)
Mean Plasma Glucose: 105 (calc)
eAG (mmol/L): 5.8 (calc)

## 2019-03-07 LAB — B12 AND FOLATE PANEL
Folate: 5.8 ng/mL
Vitamin B-12: 349 pg/mL (ref 200–1100)

## 2019-03-07 LAB — VITAMIN D 25 HYDROXY (VIT D DEFICIENCY, FRACTURES): Vit D, 25-Hydroxy: 34 ng/mL (ref 30–100)

## 2019-03-13 ENCOUNTER — Other Ambulatory Visit (HOSPITAL_COMMUNITY): Payer: Self-pay

## 2019-03-20 ENCOUNTER — Ambulatory Visit (HOSPITAL_COMMUNITY): Payer: BC Managed Care – PPO | Admitting: Nurse Practitioner

## 2019-03-27 ENCOUNTER — Inpatient Hospital Stay (HOSPITAL_COMMUNITY): Payer: BC Managed Care – PPO | Attending: Nurse Practitioner | Admitting: Nurse Practitioner

## 2019-03-27 ENCOUNTER — Encounter (HOSPITAL_COMMUNITY): Payer: Self-pay | Admitting: Nurse Practitioner

## 2019-03-27 ENCOUNTER — Other Ambulatory Visit: Payer: Self-pay

## 2019-03-27 DIAGNOSIS — F17210 Nicotine dependence, cigarettes, uncomplicated: Secondary | ICD-10-CM | POA: Insufficient documentation

## 2019-03-27 DIAGNOSIS — E559 Vitamin D deficiency, unspecified: Secondary | ICD-10-CM | POA: Insufficient documentation

## 2019-03-27 DIAGNOSIS — E039 Hypothyroidism, unspecified: Secondary | ICD-10-CM | POA: Diagnosis not present

## 2019-03-27 DIAGNOSIS — Z833 Family history of diabetes mellitus: Secondary | ICD-10-CM | POA: Insufficient documentation

## 2019-03-27 DIAGNOSIS — K909 Intestinal malabsorption, unspecified: Secondary | ICD-10-CM | POA: Insufficient documentation

## 2019-03-27 DIAGNOSIS — E538 Deficiency of other specified B group vitamins: Secondary | ICD-10-CM | POA: Insufficient documentation

## 2019-03-27 DIAGNOSIS — Z8249 Family history of ischemic heart disease and other diseases of the circulatory system: Secondary | ICD-10-CM | POA: Diagnosis not present

## 2019-03-27 DIAGNOSIS — D508 Other iron deficiency anemias: Secondary | ICD-10-CM | POA: Insufficient documentation

## 2019-03-27 DIAGNOSIS — Z9884 Bariatric surgery status: Secondary | ICD-10-CM | POA: Insufficient documentation

## 2019-03-27 DIAGNOSIS — Z79899 Other long term (current) drug therapy: Secondary | ICD-10-CM | POA: Insufficient documentation

## 2019-03-27 NOTE — Progress Notes (Signed)
[facility] [address], [place] [address]   CLINIC:  Medical Oncology/Hematology  PCP:  [name], NP [address] [place] [address] [phone]   REASON FOR VISIT: Follow-up for iron deficiency anemia related to gastric bypass  CURRENT THERAPY: Intermittent iron infusions   INTERVAL HISTORY:  [name] 64 y.o. female returns for routine follow-up iron deficiency anemia related to gastric bypass.  Patient reports she still has fatigue from day-to-day.  She denies any bright red bleeding per rectum or melena.  She denies any easy bruising or bleeding. Denies any nausea, vomiting, or diarrhea. Denies any new pains. Had not noticed any recent bleeding such as epistaxis, hematuria or hematochezia. Denies recent chest pain on exertion, shortness of breath on minimal exertion, pre-syncopal episodes, or palpitations. Denies any numbness or tingling in hands or feet. Denies any recent fevers, infections, or recent hospitalizations. Patient reports appetite at 100% and energy level at 50%.  She is eating well maintain her weight at this time.    REVIEW OF SYSTEMS:  Review of Systems  Constitutional: Positive for fatigue.  Respiratory: Positive for shortness of breath.   Gastrointestinal: Positive for diarrhea.  All other systems reviewed and are negative.    PAST MEDICAL/SURGICAL HISTORY:  Past Medical History:  Diagnosis Date  . Anemia   . Chronic back pain   . Hypothyroidism    Past Surgical History:  Procedure Laterality Date  . CESAREAN SECTION  [id number]  . COLONOSCOPY N/A 08/07/2013   Procedure: COLONOSCOPY;  Surgeon: West Bali, MD;  [facility];  Service: Endoscopy;  Laterality: N/A;  10:30-moved to [address] notified pt  . ESOPHAGOGASTRODUODENOSCOPY    . ESOPHAGOGASTRODUODENOSCOPY N/A 08/07/2013   Procedure: ESOPHAGOGASTRODUODENOSCOPY (EGD);  Surgeon: West Bali, MD;  [facility];  Service:  Endoscopy;  Laterality: N/A;  . GASTRIC BYPASS  [address] [place]  . TONSILLECTOMY     Childhood  . UPPER GI ENDOSCOPY  2004   Dr.Borhanian      SOCIAL HISTORY:  Social History   Socioeconomic History  . Marital status: Married    Spouse name: Not on file  . Number of children: Not on file  . Years of education: Not on file  . Highest education level: Not on file  Occupational History  . Occupation: Environmental health practitioner: [employer]  Tobacco Use  . Smoking status: Current Every Day Smoker    Packs/day: 0.50    Years: 10.00    Pack years: 5.00    Types: Cigarettes  . Smokeless tobacco: Never Used  . Tobacco comment: Relasped  Substance and Sexual Activity  . Alcohol use: No    Alcohol/week: 0.0 standard drinks  . Drug use: No  . Sexual activity: Yes  Other Topics Concern  . Not on file  Social History Narrative   Married, 1990, has 2 children. Patient lives in a multi-level home. Previously smoked 1/2 PPD, smoked for 10-20 years. Drinks a minimal amount of caffeinated beverages.    Social Determinants of Health   Financial Resource Strain:   . Difficulty of Paying Living Expenses:   Food Insecurity:   . Worried About Programme researcher, broadcasting/film/video in the Last Year:   . Barista in the Last Year:   Transportation Needs:   . Freight forwarder (Medical):   Marland Kitchen Lack of Transportation (Non-Medical):   Physical Activity:   .  Days of Exercise per Week:   . Minutes of Exercise per Session:   Stress:   . Feeling of Stress :   Social Connections:   . Frequency of Communication with Friends and Family:   . Frequency of Social Gatherings with Friends and Family:   . Attends Religious Services:   . Active Member of Clubs or Organizations:   . Attends Banker Meetings:   Marland Kitchen Marital Status:   Intimate Partner Violence:   . Fear of Current or Ex-Partner:   . Emotionally Abused:   Marland Kitchen Physically Abused:   . Sexually Abused:     FAMILY HISTORY:    Family History  Problem Relation Age of Onset  . Heart disease Mother   . Diabetes Mother   . Stroke Mother   . Hypertension Mother   . Diabetes Father   . Heart disease Father   . Hypertension Father   . Migraines Daughter   . Colon cancer Neg Hx     CURRENT MEDICATIONS:  Outpatient Encounter Medications as of 03/27/2019  Medication Sig  . cyanocobalamin (,VITAMIN B-12,) 1000 MCG/ML injection INJECT INTO THE MUSCLE EVERY 21 DAYS.  Marland Kitchen escitalopram (LEXAPRO) 10 MG tablet TAKE ONE TABLET BY MOUTH ONCE DAILY.  . folic acid (FOLVITE) 1 MG tablet TAKE (1) TABLET BY MOUTH ONCE DAILY.  Marland Kitchen levothyroxine (SYNTHROID) 50 MCG tablet TAKE ONE TABLET BY MOUTH ONCE DAILY.  Marland Kitchen Vitamin D, Ergocalciferol, (DRISDOL) 1.25 MG (50000 UT) CAPS capsule TAKE 1 CAPSULE BY MOUTH ONCE A WEEK.  Marland Kitchen zonisamide (ZONEGRAN) 100 MG capsule Take 200 mg by mouth daily.   Marland Kitchen acetaminophen (TYLENOL) 500 MG tablet Take 500 mg by mouth every 6 (six) hours as needed for mild pain or headache.  Marland Kitchen tiZANidine (ZANAFLEX) 2 MG tablet Take 2 mg by mouth as needed (Migraine).   No facility-administered encounter medications on file as of 03/27/2019.    ALLERGIES:  No Known Allergies   PHYSICAL EXAM:  ECOG Performance status: 1  Vitals:   03/27/19 1024  BP: 124/71  Pulse: 64  Resp: 18  Temp: (!) 96.8 F (36 C)  SpO2: 100%   Filed Weights   03/27/19 1024  Weight: 188 lb 8 oz (85.5 kg)    Physical Exam Constitutional:      Appearance: Normal appearance. She is normal weight.  Musculoskeletal:        General: Normal range of motion.  Skin:    General: Skin is warm.  Neurological:     Mental Status: She is alert and oriented to person, place, and time. Mental status is at baseline.  Psychiatric:        Mood and Affect: Mood normal.        Behavior: Behavior normal.        Thought Content: Thought content normal.        Judgment: Judgment normal.      LABORATORY DATA:  I have reviewed the labs as listed.   CBC    Component Value Date/Time   WBC 4.2 03/06/2019 0938   RBC 3.54 (L) 03/06/2019 0938   HGB 11.8 03/06/2019 0938   HCT 34.6 (L) 03/06/2019 0938   PLT 287 03/06/2019 0938   MCV 97.7 03/06/2019 0938   MCH 33.3 (H) 03/06/2019 0938   MCHC 34.1 03/06/2019 0938   RDW 11.8 03/06/2019 0938   LYMPHSABS 1,319 03/06/2019 0938   MONOABS 0.3 12/11/2017 0907   EOSABS 101 03/06/2019 0938   BASOSABS 38 03/06/2019 3614  CMP Latest Ref Rng & Units 03/06/2019 08/29/2018 02/07/2018  Glucose 65 - 99 mg/dL 78 62(L) 78  BUN 7 - 25 mg/dL 15 15 18   Creatinine 0.50 - 0.99 mg/dL 0.84 1.19(H) 0.92  Sodium 135 - 146 mmol/L 141 143 142  Potassium 3.5 - 5.3 mmol/L 4.3 4.0 4.4  Chloride 98 - 110 mmol/L 109 112(H) 110  CO2 20 - 32 mmol/L 27 26 27   Calcium 8.6 - 10.4 mg/dL 8.8 8.7 9.1  Total Protein 6.1 - 8.1 g/dL 5.7(L) 5.7(L) -  Total Bilirubin 0.2 - 1.2 mg/dL 0.4 0.2 -  Alkaline Phos 38 - 126 U/L - - -  AST 10 - 35 U/L 14 11 -  ALT 6 - 29 U/L 14 10 -    I personally performed a face-to-face visit,   All questions were answered to patient's stated satisfaction. Encouraged patient to call with any new concerns or questions before his next visit to the cancer center and we can certain see him sooner, if needed.     ASSESSMENT & PLAN:   Iron deficiency anemia 1.  Iron deficiency anemia: -Secondary to malabsorption from gastric bypass surgery. -Colonoscopy on 08/07/2013 with a large redundant colon and internal/external hemorrhoids. -EGD on 08/07/2013 with a Schatzki ring at GE junction and small clean-based ulcer at anastomosis. -Last Feraheme on 06/21/2017.  This helped improve her tiredness. -Labs done on 03/06/2019 showed hemoglobin 11.8 and ferritin 166 -Due to her energy levels and lower ferritin we will give 2 infusions of IV iron. -She will follow-up in 6 months with repeat labs.  2.  Vitamin B12 deficiency: -Secondary to malabsorption from gastric bypass surgery. -Labs done on 03/06/2019  showed her vitamin B12 level 349.  3.  Vitamin D deficiency: -She is taking 50,000 units weekly -Labs done on 03/06/2019 showed her vitamin D level 34      Orders placed this encounter:  No orders of the defined types were placed in this encounter.  She usually gets labs done through her PCP.   Francene Finders, FNP-C [facility] [phone]

## 2019-03-27 NOTE — Assessment & Plan Note (Signed)
1.  Iron deficiency anemia: -Secondary to malabsorption from gastric bypass surgery. -Colonoscopy on 08/07/2013 with a large redundant colon and internal/external hemorrhoids. -EGD on 08/07/2013 with a Schatzki ring at GE junction and small clean-based ulcer at anastomosis. -Last Feraheme on 06/21/2017.  This helped improve her tiredness. -Labs done on 03/06/2019 showed hemoglobin 11.8 and ferritin 166 -Due to her energy levels and lower ferritin we will give 2 infusions of IV iron. -She will follow-up in 6 months with repeat labs.  2.  Vitamin B12 deficiency: -Secondary to malabsorption from gastric bypass surgery. -Labs done on 03/06/2019 showed her vitamin B12 level 349.  3.  Vitamin D deficiency: -She is taking 50,000 units weekly -Labs done on 03/06/2019 showed her vitamin D level 34

## 2019-04-03 DIAGNOSIS — F4323 Adjustment disorder with mixed anxiety and depressed mood: Secondary | ICD-10-CM | POA: Diagnosis not present

## 2019-04-17 ENCOUNTER — Other Ambulatory Visit (HOSPITAL_COMMUNITY): Payer: Self-pay | Admitting: Nurse Practitioner

## 2019-04-17 ENCOUNTER — Other Ambulatory Visit: Payer: Self-pay

## 2019-04-17 ENCOUNTER — Inpatient Hospital Stay (HOSPITAL_COMMUNITY): Payer: BC Managed Care – PPO | Attending: Nurse Practitioner

## 2019-04-17 VITALS — BP 116/48 | HR 61 | Temp 97.7°F | Resp 18

## 2019-04-17 DIAGNOSIS — G43719 Chronic migraine without aura, intractable, without status migrainosus: Secondary | ICD-10-CM | POA: Diagnosis not present

## 2019-04-17 DIAGNOSIS — D508 Other iron deficiency anemias: Secondary | ICD-10-CM

## 2019-04-17 DIAGNOSIS — G43111 Migraine with aura, intractable, with status migrainosus: Secondary | ICD-10-CM | POA: Diagnosis not present

## 2019-04-17 DIAGNOSIS — Z9884 Bariatric surgery status: Secondary | ICD-10-CM

## 2019-04-17 DIAGNOSIS — M542 Cervicalgia: Secondary | ICD-10-CM | POA: Diagnosis not present

## 2019-04-17 DIAGNOSIS — K912 Postsurgical malabsorption, not elsewhere classified: Secondary | ICD-10-CM

## 2019-04-17 MED ORDER — SODIUM CHLORIDE 0.9 % IV SOLN
510.0000 mg | Freq: Once | INTRAVENOUS | Status: AC
  Administered 2019-04-17: 510 mg via INTRAVENOUS
  Filled 2019-04-17: qty 510

## 2019-04-17 MED ORDER — SODIUM CHLORIDE 0.9 % IV SOLN: Freq: Once | INTRAVENOUS | Status: AC

## 2019-04-17 NOTE — Progress Notes (Signed)
Iron given per orders. Patient tolerated it well without problems. Vitals stable and discharged home from clinic ambulatory. Follow up as scheduled.  

## 2019-04-17 NOTE — Patient Instructions (Signed)
[facility]  Discharge Instructions:   _______________________________________________________________  Thank you for choosing [facility] at [facility] to provide your oncology and hematology care.  To afford each patient quality time with our providers, please arrive at least 15 minutes before your scheduled appointment.  You need to re-schedule your appointment if you arrive 10 or more minutes late.  We strive to give you quality time with our providers, and arriving late affects you and other patients whose appointments are after yours.  Also, if you no show three or more times for appointments you may be dismissed from the clinic.  Again, thank you for choosing [facility]. Our hope is that these requests will allow you access to exceptional care and in a timely manner. _______________________________________________________________  If you have questions after your visit, please contact our office at [phone] between the hours of 8:30 a.m. and 5:00 p.m. Voicemails left after 4:30 p.m. will not be returned until the following business day. _______________________________________________________________  For prescription refill requests, have your pharmacy contact our office. _______________________________________________________________  Recommendations made by the consultant and any test results will be sent to your referring physician. _______________________________________________________________ 

## 2019-04-24 ENCOUNTER — Inpatient Hospital Stay (HOSPITAL_COMMUNITY): Payer: BC Managed Care – PPO

## 2019-04-24 ENCOUNTER — Encounter (HOSPITAL_COMMUNITY): Payer: Self-pay

## 2019-04-24 ENCOUNTER — Other Ambulatory Visit: Payer: Self-pay

## 2019-04-24 VITALS — BP 101/54 | HR 54 | Temp 97.5°F | Resp 18

## 2019-04-24 DIAGNOSIS — D508 Other iron deficiency anemias: Secondary | ICD-10-CM

## 2019-04-24 DIAGNOSIS — Z9884 Bariatric surgery status: Secondary | ICD-10-CM

## 2019-04-24 DIAGNOSIS — K912 Postsurgical malabsorption, not elsewhere classified: Secondary | ICD-10-CM

## 2019-04-24 MED ORDER — SODIUM CHLORIDE 0.9 % IV SOLN: Freq: Once | INTRAVENOUS | Status: AC

## 2019-04-24 MED ORDER — SODIUM CHLORIDE 0.9 % IV SOLN
510.0000 mg | Freq: Once | INTRAVENOUS | Status: AC
  Administered 2019-04-24: 510 mg via INTRAVENOUS
  Filled 2019-04-24: qty 510

## 2019-04-24 NOTE — Patient Instructions (Signed)
[facility]  Discharge Instructions:   _______________________________________________________________  Thank you for choosing [facility] to provide your oncology and hematology care.  To afford each patient quality time with our providers, please arrive at least 15 minutes before your scheduled appointment.  You need to re-schedule your appointment if you arrive 10 or more minutes late.  We strive to give you quality time with our providers, and arriving late affects you and other patients whose appointments are after yours.  Also, if you no show three or more times for appointments you may be dismissed from the clinic.  Again, thank you for choosing [facility] at [facility]. Our hope is that these requests will allow you access to exceptional care and in a timely manner. _______________________________________________________________  If you have questions after your visit, please contact our office at [phone] between the hours of 8:30 a.m. and 5:00 p.m. Voicemails left after 4:30 p.m. will not be returned until the following business day. _______________________________________________________________  For prescription refill requests, have your pharmacy contact our office. _______________________________________________________________  Recommendations made by the consultant and any test results will be sent to your referring physician. _______________________________________________________________ 

## 2019-04-24 NOTE — Progress Notes (Signed)
Patient presents today for Feraheme infusion. MAR reviewed. Vital signs stable. Patient denies any pain today or changes since her last visit.   Feraheme given today per MD orders. Tolerated infusion without adverse affects. Vital signs stable. No complaints at this time. Discharged from clinic ambulatory. F/U with [facility] as scheduled.

## 2019-05-01 ENCOUNTER — Other Ambulatory Visit: Payer: Self-pay | Admitting: Nurse Practitioner

## 2019-05-01 DIAGNOSIS — F4323 Adjustment disorder with mixed anxiety and depressed mood: Secondary | ICD-10-CM | POA: Diagnosis not present

## 2019-05-29 DIAGNOSIS — F4323 Adjustment disorder with mixed anxiety and depressed mood: Secondary | ICD-10-CM | POA: Diagnosis not present

## 2019-06-22 DIAGNOSIS — H5203 Hypermetropia, bilateral: Secondary | ICD-10-CM | POA: Diagnosis not present

## 2019-06-22 DIAGNOSIS — H2513 Age-related nuclear cataract, bilateral: Secondary | ICD-10-CM | POA: Diagnosis not present

## 2019-06-22 DIAGNOSIS — H524 Presbyopia: Secondary | ICD-10-CM | POA: Diagnosis not present

## 2019-06-22 DIAGNOSIS — H52203 Unspecified astigmatism, bilateral: Secondary | ICD-10-CM | POA: Diagnosis not present

## 2019-07-03 DIAGNOSIS — F4323 Adjustment disorder with mixed anxiety and depressed mood: Secondary | ICD-10-CM | POA: Diagnosis not present

## 2019-07-17 DIAGNOSIS — M542 Cervicalgia: Secondary | ICD-10-CM | POA: Diagnosis not present

## 2019-07-17 DIAGNOSIS — G43111 Migraine with aura, intractable, with status migrainosus: Secondary | ICD-10-CM | POA: Diagnosis not present

## 2019-07-17 DIAGNOSIS — G43719 Chronic migraine without aura, intractable, without status migrainosus: Secondary | ICD-10-CM | POA: Diagnosis not present

## 2019-07-20 ENCOUNTER — Other Ambulatory Visit (HOSPITAL_COMMUNITY): Payer: Self-pay | Admitting: Nurse Practitioner

## 2019-07-31 DIAGNOSIS — F4323 Adjustment disorder with mixed anxiety and depressed mood: Secondary | ICD-10-CM | POA: Diagnosis not present

## 2019-08-05 DIAGNOSIS — L570 Actinic keratosis: Secondary | ICD-10-CM | POA: Diagnosis not present

## 2019-08-05 DIAGNOSIS — D225 Melanocytic nevi of trunk: Secondary | ICD-10-CM | POA: Diagnosis not present

## 2019-08-05 DIAGNOSIS — L814 Other melanin hyperpigmentation: Secondary | ICD-10-CM | POA: Diagnosis not present

## 2019-08-21 ENCOUNTER — Ambulatory Visit: Payer: BC Managed Care – PPO | Admitting: Nurse Practitioner

## 2019-09-04 ENCOUNTER — Ambulatory Visit: Payer: BC Managed Care – PPO | Admitting: Nurse Practitioner

## 2019-09-04 DIAGNOSIS — F4323 Adjustment disorder with mixed anxiety and depressed mood: Secondary | ICD-10-CM | POA: Diagnosis not present

## 2019-09-07 ENCOUNTER — Ambulatory Visit: Payer: BC Managed Care – PPO | Admitting: Nurse Practitioner

## 2019-09-18 ENCOUNTER — Ambulatory Visit: Payer: BC Managed Care – PPO | Admitting: Nurse Practitioner

## 2019-09-18 ENCOUNTER — Other Ambulatory Visit: Payer: Self-pay

## 2019-09-18 ENCOUNTER — Encounter: Payer: Self-pay | Admitting: Nurse Practitioner

## 2019-09-18 ENCOUNTER — Ambulatory Visit (INDEPENDENT_AMBULATORY_CARE_PROVIDER_SITE_OTHER): Payer: BC Managed Care – PPO | Admitting: Nurse Practitioner

## 2019-09-18 VITALS — BP 122/78 | HR 62 | Temp 96.6°F | Ht 67.5 in | Wt 189.0 lb

## 2019-09-18 DIAGNOSIS — K909 Intestinal malabsorption, unspecified: Secondary | ICD-10-CM

## 2019-09-18 DIAGNOSIS — E785 Hyperlipidemia, unspecified: Secondary | ICD-10-CM

## 2019-09-18 DIAGNOSIS — E559 Vitamin D deficiency, unspecified: Secondary | ICD-10-CM | POA: Diagnosis not present

## 2019-09-18 DIAGNOSIS — E538 Deficiency of other specified B group vitamins: Secondary | ICD-10-CM

## 2019-09-18 DIAGNOSIS — F17200 Nicotine dependence, unspecified, uncomplicated: Secondary | ICD-10-CM

## 2019-09-18 DIAGNOSIS — Z23 Encounter for immunization: Secondary | ICD-10-CM | POA: Diagnosis not present

## 2019-09-18 DIAGNOSIS — K912 Postsurgical malabsorption, not elsewhere classified: Secondary | ICD-10-CM

## 2019-09-18 DIAGNOSIS — Z0000 Encounter for general adult medical examination without abnormal findings: Secondary | ICD-10-CM | POA: Diagnosis not present

## 2019-09-18 DIAGNOSIS — E034 Atrophy of thyroid (acquired): Secondary | ICD-10-CM | POA: Diagnosis not present

## 2019-09-18 DIAGNOSIS — E119 Type 2 diabetes mellitus without complications: Secondary | ICD-10-CM

## 2019-09-18 DIAGNOSIS — N952 Postmenopausal atrophic vaginitis: Secondary | ICD-10-CM

## 2019-09-18 MED ORDER — ESTRADIOL 0.1 MG/GM VA CREA: 1.0000 | TOPICAL_CREAM | VAGINAL | 12 refills | Status: DC

## 2019-09-18 NOTE — Progress Notes (Signed)
Provider: Lauree Chandler, NP  Patient Care Team: April Chandler, NP as PCP - General (Geriatric Medicine) Gayland Curry, DO as Consulting Physician (Geriatric Medicine) Derek Jack, MD as Consulting Physician (Hematology) Rutherford Guys, MD as Consulting Physician (Ophthalmology)  Extended Emergency Contact Information Primary Emergency Contact: Donnelly Stager Address: [address]          [place], [place] [address] [name] of [place] Phone: [phone] Relation: Spouse No Known Allergies Code Status: FULL Goals of Care: Advanced Directive information Advanced Directives 04/24/2019  Does Patient Have a Medical Advance Directive? Yes  Type of Paramedic of [place];Living will  Does patient want to make changes to medical advance directive? No - Patient declined  Copy of [place] in Chart? No - copy requested  Would patient like information on creating a medical advance directive? No - Patient declined  Pre-existing out of facility DNR order (yellow form or pink MOST form) -     Chief Complaint  Patient presents with  . Annual Exam    Yearly physical and fasting if labs needed   . Immunizations    High dose flu vaccine today     HPI: Patient is a 64 y.o. female seen in today for an wellness exam at [place]? Healthy diet.   Exercise? Not routine.   Continues to smoke, encouraged cessation.    Dentition:routinely   Ophthalmology appt: up to date 06/22/19  Routine specialist: hematologist due to anemia, dermatologist   Depression screen [facility] Fall 2/9 09/18/2019 02/07/2018 11/21/2015 11/23/2014 10/22/2013  Decreased Interest 0 0 0 0 0  Down, Depressed, Hopeless 0 0 0 0 0  PHQ - 2 Score 0 0 0 0 0    Fall Risk  09/18/2019 03/06/2019 08/29/2018 02/07/2018 11/02/2016  Falls in the past year? 0 0 0 0 No  Number falls in past yr: 0 0 0 0 -  Injury with Fall? 0 0 0 0 -   No flowsheet data found.   Health  Maintenance  Topic Date Due  . OPHTHALMOLOGY EXAM  03/09/2018  . HEMOGLOBIN A1C  09/03/2019  . FOOT EXAM  03/05/2020  . URINE MICROALBUMIN  03/05/2020  . MAMMOGRAM  09/25/2020  . PAP SMEAR-Modifier  08/28/2021  . COLONOSCOPY  08/08/2023  . TETANUS/TDAP  11/22/2024  . PNEUMOCOCCAL POLYSACCHARIDE VACCINE AGE 54-64 HIGH RISK  Completed  . COVID-19 Vaccine  Completed  . Hepatitis C Screening  Completed  . HIV Screening  Completed    Past Medical History:  Diagnosis Date  . Anemia   . Chronic back pain   . Hypothyroidism     Past Surgical History:  Procedure Laterality Date  . CESAREAN SECTION  [id number]  . COLONOSCOPY N/A 08/07/2013   Procedure: COLONOSCOPY;  Surgeon: Danie Binder, MD;  [facility];  Service: Endoscopy;  Laterality: N/A;  10:30-moved to [place] notified pt  . ESOPHAGOGASTRODUODENOSCOPY    . ESOPHAGOGASTRODUODENOSCOPY N/A 08/07/2013   Procedure: ESOPHAGOGASTRODUODENOSCOPY (EGD);  Surgeon: Danie Binder, MD;  Location: [facility];  Service: Endoscopy;  Laterality: N/A;  . GASTRIC BYPASS  [address], [place]  . TONSILLECTOMY     Childhood  . UPPER GI ENDOSCOPY  2004   Dr.Borhanian     Social History   Socioeconomic History  . Marital status: Married    Spouse name: Not on file  . Number of children: Not on file  . Years of education:  Not on file  . Highest education level: Not on file  Occupational History  . Occupation: Environmental health practitioner: [employer]  Tobacco Use  . Smoking status: Current Every Day Smoker    Packs/day: 0.50    Years: 10.00    Pack years: 5.00    Types: Cigarettes  . Smokeless tobacco: Never Used  . Tobacco comment: Relasped  Vaping Use  . Vaping Use: Never used  Substance and Sexual Activity  . Alcohol use: No    Alcohol/week: 0.0 standard drinks  . Drug use: No  . Sexual activity: Yes  Other Topics Concern  . Not on file  Social History Narrative   Married, 1990, has 2 children. Patient  lives in a multi-level home. Previously smoked 1/2 PPD, smoked for 10-20 years. Drinks a minimal amount of caffeinated beverages.    Social Determinants of Health   Financial Resource Strain:   . Difficulty of Paying Living Expenses: Not on file  Food Insecurity:   . Worried About Programme researcher, broadcasting/film/video in the Last Year: Not on file  . Ran Out of Food in the Last Year: Not on file  Transportation Needs:   . Lack of Transportation (Medical): Not on file  . Lack of Transportation (Non-Medical): Not on file  Physical Activity:   . Days of Exercise per Week: Not on file  . Minutes of Exercise per Session: Not on file  Stress:   . Feeling of Stress : Not on file  Social Connections:   . Frequency of Communication with Friends and Family: Not on file  . Frequency of Social Gatherings with Friends and Family: Not on file  . Attends Religious Services: Not on file  . Active Member of Clubs or Organizations: Not on file  . Attends Banker Meetings: Not on file  . Marital Status: Not on file    Family History  Problem Relation Age of Onset  . Heart disease Mother   . Diabetes Mother   . Stroke Mother   . Hypertension Mother   . Diabetes Father   . Heart disease Father   . Hypertension Father   . Migraines Daughter   . Colon cancer Neg Hx     Review of Systems:  Review of Systems  Constitutional: Negative for chills, fever and weight loss.  HENT: Negative for tinnitus.   Respiratory: Negative for cough, sputum production and shortness of breath.   Cardiovascular: Negative for chest pain, palpitations and leg swelling.  Gastrointestinal: Negative for abdominal pain, constipation, diarrhea and heartburn.  Genitourinary: Negative for dysuria, frequency and urgency.  Musculoskeletal: Negative for back pain, falls, joint pain and myalgias.  Skin: Negative.   Neurological: Negative for dizziness and headaches.  Psychiatric/Behavioral: Negative for depression and memory  loss. The patient does not have insomnia.      Allergies as of 09/18/2019   No Known Allergies     Medication List       Accurate as of September 18, 2019  9:26 AM. If you have any questions, ask your nurse or doctor.        acetaminophen 500 MG tablet Commonly known as: TYLENOL Take 500 mg by mouth every 6 (six) hours as needed for mild pain or headache.   cyanocobalamin 1000 MCG/ML injection Commonly known as: (VITAMIN B-12) INJECT INTO THE MUSCLE EVERY 21 DAYS.   escitalopram 10 MG tablet Commonly known as: LEXAPRO TAKE ONE TABLET BY MOUTH ONCE DAILY.   folic  acid 1 MG tablet Commonly known as: FOLVITE TAKE (1) TABLET BY MOUTH ONCE DAILY.   levothyroxine 50 MCG tablet Commonly known as: SYNTHROID TAKE ONE TABLET BY MOUTH ONCE DAILY.   tiZANidine 2 MG tablet Commonly known as: ZANAFLEX Take 2 mg by mouth as needed (Migraine).   tiZANidine 4 MG tablet Commonly known as: ZANAFLEX Take 4 mg by mouth 2 (two) times daily as needed.   Vitamin D (Ergocalciferol) 1.25 MG (50000 UNIT) Caps capsule Commonly known as: DRISDOL TAKE 1 CAPSULE BY MOUTH ONCE A WEEK.   zonisamide 100 MG capsule Commonly known as: ZONEGRAN Take 200 mg by mouth daily.         Physical Exam: Vitals:   09/18/19 0914  BP: 122/78  Pulse: 62  Temp: (!) 96.6 F (35.9 C)  TempSrc: Temporal  SpO2: 99%  Weight: 189 lb (85.7 kg)  Height: 5' 7.5" (1.715 m)   Body mass index is 29.16 kg/m. Wt Readings from Last 3 Encounters:  09/18/19 189 lb (85.7 kg)  03/27/19 188 lb 8 oz (85.5 kg)  03/06/19 188 lb (85.3 kg)    Physical Exam Constitutional:      General: She is not in acute distress.    Appearance: She is well-developed. She is not diaphoretic.  HENT:     Head: Normocephalic and atraumatic.     Mouth/Throat:     Pharynx: No oropharyngeal exudate.  Eyes:     Conjunctiva/sclera: Conjunctivae normal.     Pupils: Pupils are equal, round, and reactive to light.    Cardiovascular:     Rate and Rhythm: Normal rate and regular rhythm.     Heart sounds: Normal heart sounds.  Pulmonary:     Effort: Pulmonary effort is normal.     Breath sounds: Normal breath sounds.  Chest:     Breasts:        Right: Normal.        Left: Normal.  Abdominal:     General: Bowel sounds are normal.     Palpations: Abdomen is soft.  Musculoskeletal:        General: No tenderness.     Cervical back: Normal range of motion and neck supple.  Lymphadenopathy:     Upper Body:     Right upper body: No supraclavicular, axillary or pectoral adenopathy.     Left upper body: No supraclavicular, axillary or pectoral adenopathy.  Skin:    General: Skin is warm and dry.  Neurological:     Mental Status: She is alert and oriented to person, place, and time.     Labs reviewed: Basic Metabolic Panel: Recent Labs    03/06/19 0938  NA 141  K 4.3  CL 109  CO2 27  GLUCOSE 78  BUN 15  CREATININE 0.84  CALCIUM 8.8  TSH 3.69   Liver Function Tests: Recent Labs    03/06/19 0938  AST 14  ALT 14  BILITOT 0.4  PROT 5.7*   No results for input(s): LIPASE, AMYLASE in the last 8760 hours. No results for input(s): AMMONIA in the last 8760 hours. CBC: Recent Labs    03/06/19 0938  WBC 4.2  NEUTROABS 2,444  HGB 11.8  HCT 34.6*  MCV 97.7  PLT 287   Lipid Panel: Recent Labs    03/06/19 0938  CHOL 181  HDL 66  LDLCALC 99  TRIG 75  CHOLHDL 2.7   Lab Results  Component Value Date   HGBA1C 5.3 03/06/2019    Procedures:  No results found.  Assessment/Plan 1. Need for influenza vaccination - Flu Vaccine QUAD High Dose(Fluad)  2. Type 2 diabetes mellitus without complication, without long-term current use of insulin (HCC) -diet controled - Flu Vaccine QUAD High Dose(Fluad) - Hemoglobin A1c  3. Malabsorption of iron -due to gastric bypass surgery, followed by hematology  - Iron, TIBC and Ferritin Panel - CBC with Differential/Platelet  4.  Postoperative malabsorption -hx of gastric bypass surgery  - B12 and Folate Panel  5. Vitamin D deficiency Continues supplement - Vitamin D, 25-hydroxy  6. Hypothyroidism due to acquired atrophy of thyroid Continues on synthroid 50 mcg - TSH  7. B12 deficiency Continues supplement and follow up with hematology  - B12 and Folate Panel  8. Smoker encouraged cessation  9. Hyperlipidemia, unspecified hyperlipidemia type Diet controlled.  - CMP with eGFR([facility]) - Lipid Panel  10. Preventative health care -completed today; the patient was counseled regarding the appropriate use of alcohol, regular self-examination of the breasts on a monthly basis, prevention of dental and periodontal disease, diet, regular sustained exercise for at least 30 minutes 5 times per week, routine screening interval for mammogram as recommended by the [place] and ACOG, importance of regular PAP smears, smoking cessation, tobacco use,  and recommended schedule for GI hemoccult testing, colonoscopy, cholesterol, thyroid and diabetes screening.  11. Vaginal atrophy - estradiol (ESTRACE VAGINAL) 0.1 MG/GM vaginal cream; Place 1 Applicatorful vaginally 3 (three) times a week.  Dispense: 42.5 g; Refill: 12   Next appt: 6 months, sooner if needed April [name] K. [place], [facility] [phone]

## 2019-09-18 NOTE — Patient Instructions (Signed)
Health Maintenance, Female Adopting a healthy lifestyle and getting preventive care are important in promoting health and wellness. Ask your health care provider about:  The right schedule for you to have regular tests and exams.  Things you can do on your own to prevent diseases and keep yourself healthy. What should I know about diet, weight, and exercise? Eat a healthy diet   Eat a diet that includes plenty of vegetables, fruits, low-fat dairy products, and lean protein.  Do not eat a lot of foods that are high in solid fats, added sugars, or sodium. Maintain a healthy weight Body mass index (BMI) is used to identify weight problems. It estimates body fat based on height and weight. Your health care provider can help determine your BMI and help you achieve or maintain a healthy weight. Get regular exercise Get regular exercise. This is one of the most important things you can do for your health. Most adults should:  Exercise for at least 150 minutes each week. The exercise should increase your heart rate and make you sweat (moderate-intensity exercise).  Do strengthening exercises at least twice a week. This is in addition to the moderate-intensity exercise.  Spend less time sitting. Even light physical activity can be beneficial. Watch cholesterol and blood lipids Have your blood tested for lipids and cholesterol at 64 years of age, then have this test every 5 years. Have your cholesterol levels checked more often if:  Your lipid or cholesterol levels are high.  You are older than 64 years of age.  You are at high risk for heart disease. What should I know about cancer screening? Depending on your health history and family history, you may need to have cancer screening at various ages. This may include screening for:  Breast cancer.  Cervical cancer.  Colorectal cancer.  Skin cancer.  Lung cancer. What should I know about heart disease, diabetes, and high blood  pressure? Blood pressure and heart disease  High blood pressure causes heart disease and increases the risk of stroke. This is more likely to develop in people who have high blood pressure readings, are of African descent, or are overweight.  Have your blood pressure checked: ? Every 3-5 years if you are 18-39 years of age. ? Every year if you are 40 years old or older. Diabetes Have regular diabetes screenings. This checks your fasting blood sugar level. Have the screening done:  Once every three years after age 40 if you are at a normal weight and have a low risk for diabetes.  More often and at a younger age if you are overweight or have a high risk for diabetes. What should I know about preventing infection? Hepatitis B If you have a higher risk for hepatitis B, you should be screened for this virus. Talk with your health care provider to find out if you are at risk for hepatitis B infection. Hepatitis C Testing is recommended for:  Everyone born from 1945 through 1965.  Anyone with known risk factors for hepatitis C. Sexually transmitted infections (STIs)  Get screened for STIs, including gonorrhea and chlamydia, if: ? You are sexually active and are younger than 64 years of age. ? You are older than 64 years of age and your health care provider tells you that you are at risk for this type of infection. ? Your sexual activity has changed since you were last screened, and you are at increased risk for chlamydia or gonorrhea. Ask your health care provider if   you are at risk.  Ask your health care provider about whether you are at high risk for HIV. Your health care provider may recommend a prescription medicine to help prevent HIV infection. If you choose to take medicine to prevent HIV, you should first get tested for HIV. You should then be tested every 3 months for as long as you are taking the medicine. Pregnancy  If you are about to stop having your period (premenopausal) and  you may become pregnant, seek counseling before you get pregnant.  Take 400 to 800 micrograms (mcg) of folic acid every day if you become pregnant.  Ask for birth control (contraception) if you want to prevent pregnancy. Osteoporosis and menopause Osteoporosis is a disease in which the bones lose minerals and strength with aging. This can result in bone fractures. If you are 65 years old or older, or if you are at risk for osteoporosis and fractures, ask your health care provider if you should:  Be screened for bone loss.  Take a calcium or vitamin D supplement to lower your risk of fractures.  Be given hormone replacement therapy (HRT) to treat symptoms of menopause. Follow these instructions at home: Lifestyle  Do not use any products that contain nicotine or tobacco, such as cigarettes, e-cigarettes, and chewing tobacco. If you need help quitting, ask your health care provider.  Do not use street drugs.  Do not share needles.  Ask your health care provider for help if you need support or information about quitting drugs. Alcohol use  Do not drink alcohol if: ? Your health care provider tells you not to drink. ? You are pregnant, may be pregnant, or are planning to become pregnant.  If you drink alcohol: ? Limit how much you use to 0-1 drink a day. ? Limit intake if you are breastfeeding.  Be aware of how much alcohol is in your drink. In the [place] one drink equals one 12 oz bottle of beer (355 mL), one 5 oz glass of wine (148 mL), or one 1 oz glass of hard liquor (44 mL). General instructions  Schedule regular health, dental, and eye exams.  Stay current with your vaccines.  Tell your health care provider if: ? You often feel depressed. ? You have ever been abused or do not feel safe at home. Summary  Adopting a healthy lifestyle and getting preventive care are important in promoting health and wellness.  Follow your health care provider's instructions about healthy  diet, exercising, and getting tested or screened for diseases.  Follow your health care provider's instructions on monitoring your cholesterol and blood pressure. This information is not intended to replace advice given to you by your health care provider. Make sure you discuss any questions you have with your health care provider. Document Revised: 12/18/2017 Document Reviewed: 12/18/2017 Elsevier Patient Education  2020 Elsevier Inc.  

## 2019-09-19 LAB — HEMOGLOBIN A1C
Hgb A1c MFr Bld: 5.2 % of total Hgb (ref <5.7)
Mean Plasma Glucose: 103 (calc)
eAG (mmol/L): 5.7 (calc)

## 2019-09-19 LAB — CBC WITH DIFFERENTIAL/PLATELET
Absolute Monocytes: 258 cells/uL (ref 200–950)
Basophils Absolute: 29 cells/uL (ref 0–200)
Basophils Relative: 0.7 %
Eosinophils Absolute: 82 cells/uL (ref 15–500)
Eosinophils Relative: 2 %
HCT: 36.3 % (ref 35.0–45.0)
Hemoglobin: 12 g/dL (ref 11.7–15.5)
Lymphs Abs: 1369 cells/uL (ref 850–3900)
MCH: 33.2 pg — ABNORMAL HIGH (ref 27.0–33.0)
MCHC: 33.1 g/dL (ref 32.0–36.0)
MCV: 100.6 fL — ABNORMAL HIGH (ref 80.0–100.0)
MPV: 10.5 fL (ref 7.5–12.5)
Monocytes Relative: 6.3 %
Neutro Abs: 2362 cells/uL (ref 1500–7800)
Neutrophils Relative %: 57.6 %
Platelets: 296 10*3/uL (ref 140–400)
RBC: 3.61 10*6/uL — ABNORMAL LOW (ref 3.80–5.10)
RDW: 11.7 % (ref 11.0–15.0)
Total Lymphocyte: 33.4 %
WBC: 4.1 10*3/uL (ref 3.8–10.8)

## 2019-09-19 LAB — COMPLETE METABOLIC PANEL WITH GFR
AG Ratio: 1.9 (calc) (ref 1.0–2.5)
ALT: 14 U/L (ref 6–29)
AST: 13 U/L (ref 10–35)
Albumin: 3.7 g/dL (ref 3.6–5.1)
Alkaline phosphatase (APISO): 86 U/L (ref 37–153)
BUN: 15 mg/dL (ref 7–25)
CO2: 28 mmol/L (ref 20–32)
Calcium: 8.4 mg/dL — ABNORMAL LOW (ref 8.6–10.4)
Chloride: 108 mmol/L (ref 98–110)
Creat: 0.83 mg/dL (ref 0.50–0.99)
GFR, Est African American: 87 mL/min/{1.73_m2} (ref >=60)
GFR, Est Non African American: 75 mL/min/{1.73_m2} (ref >=60)
Globulin: 2 g/dL (calc) (ref 1.9–3.7)
Glucose, Bld: 76 mg/dL (ref 65–99)
Potassium: 4.1 mmol/L (ref 3.5–5.3)
Sodium: 141 mmol/L (ref 135–146)
Total Bilirubin: 0.3 mg/dL (ref 0.2–1.2)
Total Protein: 5.7 g/dL — ABNORMAL LOW (ref 6.1–8.1)

## 2019-09-19 LAB — IRON,TIBC AND FERRITIN PANEL
%SAT: 51 % (calc) — ABNORMAL HIGH (ref 16–45)
Ferritin: 247 ng/mL (ref 16–288)
Iron: 127 ug/dL (ref 45–160)
TIBC: 249 mcg/dL (calc) — ABNORMAL LOW (ref 250–450)

## 2019-09-19 LAB — LIPID PANEL
Cholesterol: 183 mg/dL (ref <200)
HDL: 68 mg/dL (ref >=50)
LDL Cholesterol (Calc): 98 mg/dL (calc)
Non-HDL Cholesterol (Calc): 115 mg/dL (calc) (ref <130)
Total CHOL/HDL Ratio: 2.7 (calc) (ref <5.0)
Triglycerides: 83 mg/dL (ref <150)

## 2019-09-19 LAB — B12 AND FOLATE PANEL
Folate: 4.8 ng/mL — ABNORMAL LOW
Vitamin B-12: 264 pg/mL (ref 200–1100)

## 2019-09-19 LAB — TSH: TSH: 3.31 mIU/L (ref 0.40–4.50)

## 2019-09-19 LAB — VITAMIN D 25 HYDROXY (VIT D DEFICIENCY, FRACTURES): Vit D, 25-Hydroxy: 37 ng/mL (ref 30–100)

## 2019-09-23 ENCOUNTER — Other Ambulatory Visit: Payer: Self-pay | Admitting: Nurse Practitioner

## 2019-09-23 ENCOUNTER — Other Ambulatory Visit (HOSPITAL_COMMUNITY): Payer: Self-pay | Admitting: Nurse Practitioner

## 2019-09-23 ENCOUNTER — Other Ambulatory Visit (HOSPITAL_COMMUNITY): Payer: Self-pay | Admitting: Hematology

## 2019-09-23 DIAGNOSIS — D508 Other iron deficiency anemias: Secondary | ICD-10-CM

## 2019-09-23 DIAGNOSIS — Z9884 Bariatric surgery status: Secondary | ICD-10-CM

## 2019-09-23 DIAGNOSIS — K912 Postsurgical malabsorption, not elsewhere classified: Secondary | ICD-10-CM

## 2019-09-23 DIAGNOSIS — K909 Intestinal malabsorption, unspecified: Secondary | ICD-10-CM

## 2019-09-25 ENCOUNTER — Other Ambulatory Visit: Payer: Self-pay

## 2019-09-25 ENCOUNTER — Inpatient Hospital Stay (HOSPITAL_COMMUNITY): Payer: BC Managed Care – PPO | Attending: Nurse Practitioner | Admitting: Nurse Practitioner

## 2019-09-25 DIAGNOSIS — E039 Hypothyroidism, unspecified: Secondary | ICD-10-CM | POA: Insufficient documentation

## 2019-09-25 DIAGNOSIS — E559 Vitamin D deficiency, unspecified: Secondary | ICD-10-CM | POA: Insufficient documentation

## 2019-09-25 DIAGNOSIS — D509 Iron deficiency anemia, unspecified: Secondary | ICD-10-CM | POA: Diagnosis not present

## 2019-09-25 DIAGNOSIS — Z9884 Bariatric surgery status: Secondary | ICD-10-CM | POA: Diagnosis not present

## 2019-09-25 DIAGNOSIS — Z79899 Other long term (current) drug therapy: Secondary | ICD-10-CM | POA: Diagnosis not present

## 2019-09-25 DIAGNOSIS — R5383 Other fatigue: Secondary | ICD-10-CM | POA: Diagnosis not present

## 2019-09-25 DIAGNOSIS — E538 Deficiency of other specified B group vitamins: Secondary | ICD-10-CM | POA: Diagnosis not present

## 2019-09-25 DIAGNOSIS — D508 Other iron deficiency anemias: Secondary | ICD-10-CM

## 2019-09-25 DIAGNOSIS — K909 Intestinal malabsorption, unspecified: Secondary | ICD-10-CM | POA: Diagnosis not present

## 2019-09-25 DIAGNOSIS — F17210 Nicotine dependence, cigarettes, uncomplicated: Secondary | ICD-10-CM | POA: Insufficient documentation

## 2019-09-25 NOTE — Assessment & Plan Note (Signed)
1.  Iron deficiency anemia: -Secondary to malabsorption from gastric bypass surgery. -Colonoscopy on 08/07/2013 with a large redundant colon and internal/external hemorrhoids. -EGD on 08/07/2013 with a Schatzki ring at GE junction and small clean-based ulcer at anastomosis. -Last Feraheme on 04/17/2019 and 04/24/2019.  This helped improve her tiredness. -Labs done on 09/18/2019 showed hemoglobin 12.0 and ferritin 249, and percent saturation 51 -we will hold off on iron infusions at this time. -She will follow-up in 4 months with repeat labs.  2.  Vitamin B12 deficiency: -Secondary to malabsorption from gastric bypass surgery. -Labs done on 09/18/2019 showed her vitamin B12 level 264  3.  Vitamin D deficiency: -She is taking 50,000 units weekly -Labs done on 09/18/2019 showed her vitamin D level 37

## 2019-09-25 NOTE — Progress Notes (Signed)
[facility] [address], [place] [address]   CLINIC:  Medical Oncology/Hematology  PCP:  [name], NP [address] [place] [address] [phone]   REASON FOR VISIT: Follow-up for iron deficiency anemia   CURRENT THERAPY: Intermittent iron infusions   INTERVAL HISTORY:  April [name] 64 y.o. female returns for routine follow-up follow-up for iron deficiency anemia.  Patient reports she is doing well since her last visit.  She does admit she has not been taking her vitamin D B12 and folate as prescribed.  She feels more fatigued because of this.  She reports she just started back today.  She denies any or melena.  She denies any easy bruising or bleeding.  Denies any nausea, vomiting, or diarrhea. Denies any new pains. Had not noticed any recent bleeding such as epistaxis, hematuria or hematochezia. Denies recent chest pain on exertion, shortness of breath on minimal exertion, pre-syncopal episodes, or palpitations. Denies any numbness or tingling in hands or feet. Denies any recent fevers, infections, or recent hospitalizations. Patient reports appetite at 100% and energy level at 75%.     REVIEW OF SYSTEMS:  Review of Systems  All other systems reviewed and are negative.    PAST MEDICAL/SURGICAL HISTORY:  Past Medical History:  Diagnosis Date  . Anemia   . Chronic back pain   . Hypothyroidism    Past Surgical History:  Procedure Laterality Date  . CESAREAN SECTION  [id number]  . COLONOSCOPY N/A 08/07/2013   Procedure: COLONOSCOPY;  Surgeon: West Bali, MD;  [facility];  Service: Endoscopy;  Laterality: N/A;  10:30-moved to [address] notified pt  . ESOPHAGOGASTRODUODENOSCOPY    . ESOPHAGOGASTRODUODENOSCOPY N/A 08/07/2013   Procedure: ESOPHAGOGASTRODUODENOSCOPY (EGD);  Surgeon: West Bali, MD;  [facility];  Service: Endoscopy;  Laterality: N/A;  . GASTRIC BYPASS  [address], [place]  .  TONSILLECTOMY     Childhood  . UPPER GI ENDOSCOPY  2004   Dr.Borhanian      SOCIAL HISTORY:  Social History   Socioeconomic History  . Marital status: Married    Spouse name: Not on file  . Number of children: Not on file  . Years of education: Not on file  . Highest education level: Not on file  Occupational History  . Occupation: Environmental health practitioner: [employer]  Tobacco Use  . Smoking status: Current Every Day Smoker    Packs/day: 0.50    Years: 10.00    Pack years: 5.00    Types: Cigarettes  . Smokeless tobacco: Never Used  . Tobacco comment: Relasped  Vaping Use  . Vaping Use: Never used  Substance and Sexual Activity  . Alcohol use: No    Alcohol/week: 0.0 standard drinks  . Drug use: No  . Sexual activity: Yes  Other Topics Concern  . Not on file  Social History Narrative   Married, 1990, has 2 children. Patient lives in a multi-level home. Previously smoked 1/2 PPD, smoked for 10-20 years. Drinks a minimal amount of caffeinated beverages.    Social Determinants of Health   Financial Resource Strain:   . Difficulty of Paying Living Expenses: Not on file  Food Insecurity:   . Worried About Programme researcher, broadcasting/film/video in the Last Year: Not on file  . Ran Out of Food in the Last Year: Not on file  Transportation Needs:   . Lack of Transportation (Medical): Not  on file  . Lack of Transportation (Non-Medical): Not on file  Physical Activity:   . Days of Exercise per Week: Not on file  . Minutes of Exercise per Session: Not on file  Stress:   . Feeling of Stress : Not on file  Social Connections:   . Frequency of Communication with Friends and Family: Not on file  . Frequency of Social Gatherings with Friends and Family: Not on file  . Attends Religious Services: Not on file  . Active Member of Clubs or Organizations: Not on file  . Attends Banker Meetings: Not on file  . Marital Status: Not on file  Intimate Partner Violence:   . Fear of  Current or Ex-Partner: Not on file  . Emotionally Abused: Not on file  . Physically Abused: Not on file  . Sexually Abused: Not on file    FAMILY HISTORY:  Family History  Problem Relation Age of Onset  . Heart disease Mother   . Diabetes Mother   . Stroke Mother   . Hypertension Mother   . Diabetes Father   . Heart disease Father   . Hypertension Father   . Migraines Daughter   . Colon cancer Neg Hx     CURRENT MEDICATIONS:  Outpatient Encounter Medications as of 09/25/2019  Medication Sig  . acetaminophen (TYLENOL) 500 MG tablet Take 500 mg by mouth every 6 (six) hours as needed for mild pain or headache.  . cyanocobalamin (,VITAMIN B-12,) 1000 MCG/ML injection INJECT INTO THE MUSCLE EVERY 21 DAYS.  Marland Kitchen escitalopram (LEXAPRO) 10 MG tablet TAKE ONE TABLET BY MOUTH ONCE DAILY.  Marland Kitchen estradiol (ESTRACE VAGINAL) 0.1 MG/GM vaginal cream Place 1 Applicatorful vaginally 3 (three) times a week.  . folic acid (FOLVITE) 1 MG tablet TAKE (1) TABLET BY MOUTH ONCE DAILY.  Marland Kitchen levothyroxine (SYNTHROID) 50 MCG tablet TAKE ONE TABLET BY MOUTH ONCE DAILY.  Marland Kitchen tiZANidine (ZANAFLEX) 4 MG tablet Take 4 mg by mouth 2 (two) times daily as needed.  . Vitamin D, Ergocalciferol, (DRISDOL) 1.25 MG (50000 UNIT) CAPS capsule TAKE 1 CAPSULE BY MOUTH ONCE A WEEK.  Marland Kitchen zonisamide (ZONEGRAN) 100 MG capsule Take 200 mg by mouth daily.   . [DISCONTINUED] tiZANidine (ZANAFLEX) 2 MG tablet Take 2 mg by mouth as needed (Migraine).   No facility-administered encounter medications on file as of 09/25/2019.    ALLERGIES:  No Known Allergies   PHYSICAL EXAM:  ECOG Performance status: 1  Vitals:   09/25/19 1136  BP: 123/77  Pulse: 68  Resp: 16  Temp: (!) 97.3 F (36.3 C)  SpO2: 100%   Filed Weights   09/25/19 1136  Weight: 188 lb (85.3 kg)   Physical Exam Constitutional:      Appearance: Normal appearance. She is normal weight.  Cardiovascular:     Rate and Rhythm: Normal rate and regular rhythm.      Heart sounds: Normal heart sounds.  Pulmonary:     Effort: Pulmonary effort is normal.     Breath sounds: Normal breath sounds.  Abdominal:     General: Bowel sounds are normal.     Palpations: Abdomen is soft.  Musculoskeletal:        General: Normal range of motion.  Skin:    General: Skin is warm.  Neurological:     Mental Status: She is alert and oriented to person, place, and time. Mental status is at baseline.  Psychiatric:        Mood and Affect:  Mood normal.        Behavior: Behavior normal.        Thought Content: Thought content normal.        Judgment: Judgment normal.      LABORATORY DATA:  I have reviewed the labs as listed.  CBC    Component Value Date/Time   WBC 4.1 09/18/2019 0959   RBC 3.61 (L) 09/18/2019 0959   HGB 12.0 09/18/2019 0959   HCT 36.3 09/18/2019 0959   PLT 296 09/18/2019 0959   MCV 100.6 (H) 09/18/2019 0959   MCH 33.2 (H) 09/18/2019 0959   MCHC 33.1 09/18/2019 0959   RDW 11.7 09/18/2019 0959   LYMPHSABS 1,369 09/18/2019 0959   MONOABS 0.3 12/11/2017 0907   EOSABS 82 09/18/2019 0959   BASOSABS 29 09/18/2019 0959   CMP Latest Ref Rng & Units 09/18/2019 03/06/2019 08/29/2018  Glucose 65 - 99 mg/dL 76 78 96(G)  BUN 7 - 25 mg/dL 15 15 15   Creatinine 0.50 - 0.99 mg/dL 8.36 6.29)  Sodium 135 - 146 mmol/L 141 141 143  Potassium 3.5 - 5.3 mmol/L 4.1 4.3 4.0  Chloride 98 - 110 mmol/L 108 109 112(H)  CO2 20 - 32 mmol/L 28 27 26   Calcium 8.6 - 10.4 mg/dL 4.76(L) 8.8 8.7  Total Protein 6.1 - 8.1 g/dL ) 5.7(L) 5.7(L)  Total Bilirubin 0.2 - 1.2 mg/dL 0.3 0.4 0.2  Alkaline Phos 38 - 126 U/L - - -  AST 10 - 35 U/L 13 14 11   ALT 6 - 29 U/L 14 14 10     All questions were answered to patient's stated satisfaction. Encouraged patient to call with any new concerns or questions before his next visit to the cancer center and we can certain see him sooner, if needed.     ASSESSMENT & PLAN:  Iron deficiency anemia 1.  Iron deficiency  anemia: -Secondary to malabsorption from gastric bypass surgery. -Colonoscopy on 08/07/2013 with a large redundant colon and internal/external hemorrhoids. -EGD on 08/07/2013 with a Schatzki ring at GE junction and small clean-based ulcer at anastomosis. -Last Feraheme on 04/17/2019 and 04/24/2019.  This helped improve her tiredness. -Labs done on 09/18/2019 showed hemoglobin 12.0 and ferritin 249, and percent saturation 51 -we will hold off on iron infusions at this time. -She will follow-up in 4 months with repeat labs.  2.  Vitamin B12 deficiency: -Secondary to malabsorption from gastric bypass surgery. -Labs done on 09/18/2019 showed her vitamin B12 level 264  3.  Vitamin D deficiency: -She is taking 50,000 units weekly -Labs done on 09/18/2019 showed her vitamin D level 37     Orders placed this encounter:  Orders Placed This Encounter  Procedures  . CBC with Differential/Platelet  . Comprehensive metabolic panel  . Ferritin  . Iron and TIBC  . Lactate dehydrogenase  . Vitamin B12  . VITAMIN D 25 Hydroxy (Vit-D Deficiency, Fractures)  . Folate      04/26/2019 FNP-C [facility] [phone]

## 2019-09-27 DIAGNOSIS — Z20822 Contact with and (suspected) exposure to covid-19: Secondary | ICD-10-CM | POA: Diagnosis not present

## 2019-10-09 DIAGNOSIS — F4323 Adjustment disorder with mixed anxiety and depressed mood: Secondary | ICD-10-CM | POA: Diagnosis not present

## 2019-10-16 ENCOUNTER — Other Ambulatory Visit (HOSPITAL_COMMUNITY): Payer: Self-pay | Admitting: *Deleted

## 2019-10-16 DIAGNOSIS — M542 Cervicalgia: Secondary | ICD-10-CM | POA: Diagnosis not present

## 2019-10-16 DIAGNOSIS — G43719 Chronic migraine without aura, intractable, without status migrainosus: Secondary | ICD-10-CM | POA: Diagnosis not present

## 2019-10-16 DIAGNOSIS — G43111 Migraine with aura, intractable, with status migrainosus: Secondary | ICD-10-CM | POA: Diagnosis not present

## 2019-10-16 MED ORDER — CYANOCOBALAMIN 1000 MCG/ML IJ SOLN: INTRAMUSCULAR | 6 refills | Status: DC

## 2019-10-16 NOTE — Telephone Encounter (Signed)
Chart reviewed, vitamin B-12 injections refilled per last office note.

## 2019-11-07 ENCOUNTER — Other Ambulatory Visit: Payer: Self-pay | Admitting: Nurse Practitioner

## 2019-11-27 DIAGNOSIS — F4323 Adjustment disorder with mixed anxiety and depressed mood: Secondary | ICD-10-CM | POA: Diagnosis not present

## 2020-01-15 DIAGNOSIS — G43719 Chronic migraine without aura, intractable, without status migrainosus: Secondary | ICD-10-CM | POA: Diagnosis not present

## 2020-01-15 DIAGNOSIS — M542 Cervicalgia: Secondary | ICD-10-CM | POA: Diagnosis not present

## 2020-01-15 DIAGNOSIS — G43111 Migraine with aura, intractable, with status migrainosus: Secondary | ICD-10-CM | POA: Diagnosis not present

## 2020-01-19 ENCOUNTER — Other Ambulatory Visit (HOSPITAL_COMMUNITY): Payer: Self-pay | Admitting: Surgery

## 2020-01-19 DIAGNOSIS — K912 Postsurgical malabsorption, not elsewhere classified: Secondary | ICD-10-CM

## 2020-01-19 DIAGNOSIS — Z9884 Bariatric surgery status: Secondary | ICD-10-CM

## 2020-01-19 DIAGNOSIS — K909 Intestinal malabsorption, unspecified: Secondary | ICD-10-CM

## 2020-01-19 DIAGNOSIS — D508 Other iron deficiency anemias: Secondary | ICD-10-CM

## 2020-01-19 MED ORDER — VITAMIN D (ERGOCALCIFEROL) 1.25 MG (50000 UNIT) PO CAPS: 50000.0000 [IU] | ORAL_CAPSULE | ORAL | 0 refills | Status: DC

## 2020-01-22 ENCOUNTER — Other Ambulatory Visit: Payer: Self-pay | Admitting: Nurse Practitioner

## 2020-01-22 DIAGNOSIS — F4323 Adjustment disorder with mixed anxiety and depressed mood: Secondary | ICD-10-CM | POA: Diagnosis not present

## 2020-01-22 NOTE — Telephone Encounter (Signed)
rx sent to pharmacy by e-script  

## 2020-01-27 ENCOUNTER — Ambulatory Visit (HOSPITAL_COMMUNITY): Payer: BC Managed Care – PPO | Admitting: Hematology

## 2020-02-03 ENCOUNTER — Telehealth: Payer: Self-pay

## 2020-02-03 NOTE — Telephone Encounter (Signed)
Patient has an appointment at [facility] on 2/11. In the past she had labs drawn here for financial reasons. She has active orders already in Epic for them. She is again requesting to have labs drawn here.

## 2020-02-04 NOTE — Telephone Encounter (Signed)
Last labs were 4 months ago, generally done every 6 months.

## 2020-02-16 NOTE — Telephone Encounter (Signed)
Left message that labs were not ordered thru [facility] and could not be drawn here, unless they were ordered to be drawn by Dyane Dustman. Shanda Bumps stated it was too early for [place] to draw labs for [facility] needs.

## 2020-02-17 ENCOUNTER — Other Ambulatory Visit (HOSPITAL_COMMUNITY): Payer: Self-pay | Admitting: *Deleted

## 2020-02-17 ENCOUNTER — Other Ambulatory Visit: Payer: Self-pay

## 2020-02-17 DIAGNOSIS — D508 Other iron deficiency anemias: Secondary | ICD-10-CM

## 2020-02-17 DIAGNOSIS — K912 Postsurgical malabsorption, not elsewhere classified: Secondary | ICD-10-CM

## 2020-02-19 ENCOUNTER — Other Ambulatory Visit: Payer: Self-pay

## 2020-02-19 ENCOUNTER — Ambulatory Visit (HOSPITAL_COMMUNITY): Payer: BC Managed Care – PPO | Admitting: Hematology and Oncology

## 2020-02-19 ENCOUNTER — Other Ambulatory Visit: Payer: BC Managed Care – PPO

## 2020-02-19 DIAGNOSIS — K909 Intestinal malabsorption, unspecified: Secondary | ICD-10-CM | POA: Diagnosis not present

## 2020-02-19 DIAGNOSIS — E538 Deficiency of other specified B group vitamins: Secondary | ICD-10-CM | POA: Diagnosis not present

## 2020-02-19 DIAGNOSIS — K912 Postsurgical malabsorption, not elsewhere classified: Secondary | ICD-10-CM | POA: Diagnosis not present

## 2020-02-19 DIAGNOSIS — D508 Other iron deficiency anemias: Secondary | ICD-10-CM

## 2020-02-19 DIAGNOSIS — R5383 Other fatigue: Secondary | ICD-10-CM | POA: Diagnosis not present

## 2020-02-19 DIAGNOSIS — E559 Vitamin D deficiency, unspecified: Secondary | ICD-10-CM | POA: Diagnosis not present

## 2020-02-19 LAB — CBC WITH DIFFERENTIAL/PLATELET
Absolute Monocytes: 382 cells/uL (ref 200–950)
Basophils Absolute: 21 cells/uL (ref 0–200)
Basophils Relative: 0.5 %
Eosinophils Absolute: 80 cells/uL (ref 15–500)
Eosinophils Relative: 1.9 %
HCT: 35.2 % (ref 35.0–45.0)
Hemoglobin: 11.9 g/dL (ref 11.7–15.5)
Lymphs Abs: 1336 cells/uL (ref 850–3900)
MCH: 33.7 pg — ABNORMAL HIGH (ref 27.0–33.0)
MCHC: 33.8 g/dL (ref 32.0–36.0)
MCV: 99.7 fL (ref 80.0–100.0)
MPV: 10.4 fL (ref 7.5–12.5)
Monocytes Relative: 9.1 %
Neutro Abs: 2381 cells/uL (ref 1500–7800)
Neutrophils Relative %: 56.7 %
Platelets: 267 10*3/uL (ref 140–400)
RBC: 3.53 10*6/uL — ABNORMAL LOW (ref 3.80–5.10)
RDW: 11.8 % (ref 11.0–15.0)
Total Lymphocyte: 31.8 %
WBC: 4.2 10*3/uL (ref 3.8–10.8)

## 2020-02-19 LAB — COMPREHENSIVE METABOLIC PANEL
AG Ratio: 1.9 (calc) (ref 1.0–2.5)
ALT: 12 U/L (ref 6–29)
AST: 15 U/L (ref 10–35)
Albumin: 3.7 g/dL (ref 3.6–5.1)
Alkaline phosphatase (APISO): 81 U/L (ref 37–153)
BUN: 14 mg/dL (ref 7–25)
CO2: 28 mmol/L (ref 20–32)
Calcium: 8.9 mg/dL (ref 8.6–10.4)
Chloride: 108 mmol/L (ref 98–110)
Creat: 0.81 mg/dL (ref 0.50–0.99)
Globulin: 1.9 g/dL (calc) (ref 1.9–3.7)
Glucose, Bld: 82 mg/dL (ref 65–99)
Potassium: 4.3 mmol/L (ref 3.5–5.3)
Sodium: 140 mmol/L (ref 135–146)
Total Bilirubin: 0.4 mg/dL (ref 0.2–1.2)
Total Protein: 5.6 g/dL — ABNORMAL LOW (ref 6.1–8.1)

## 2020-02-19 LAB — IRON,TIBC AND FERRITIN PANEL
%SAT: 40 % (calc) (ref 16–45)
Ferritin: 221 ng/mL (ref 16–288)
Iron: 93 ug/dL (ref 45–160)
TIBC: 235 mcg/dL (calc) — ABNORMAL LOW (ref 250–450)

## 2020-02-19 LAB — VITAMIN D 25 HYDROXY (VIT D DEFICIENCY, FRACTURES): Vit D, 25-Hydroxy: 64 ng/mL (ref 30–100)

## 2020-02-19 LAB — FOLATE: Folate: 13.1 ng/mL

## 2020-02-19 LAB — VITAMIN B12: Vitamin B-12: 391 pg/mL (ref 200–1100)

## 2020-02-19 LAB — LACTATE DEHYDROGENASE: LDH: 114 U/L — ABNORMAL LOW (ref 120–250)

## 2020-02-26 ENCOUNTER — Other Ambulatory Visit: Payer: Self-pay

## 2020-02-26 ENCOUNTER — Inpatient Hospital Stay (HOSPITAL_COMMUNITY): Payer: BC Managed Care – PPO | Attending: Hematology | Admitting: Hematology and Oncology

## 2020-02-26 ENCOUNTER — Encounter (HOSPITAL_COMMUNITY): Payer: Self-pay | Admitting: Hematology and Oncology

## 2020-02-26 VITALS — BP 118/63 | HR 61 | Temp 98.1°F | Resp 16 | Wt 187.0 lb

## 2020-02-26 DIAGNOSIS — D519 Vitamin B12 deficiency anemia, unspecified: Secondary | ICD-10-CM

## 2020-02-26 DIAGNOSIS — E559 Vitamin D deficiency, unspecified: Secondary | ICD-10-CM | POA: Diagnosis not present

## 2020-02-26 DIAGNOSIS — Z8249 Family history of ischemic heart disease and other diseases of the circulatory system: Secondary | ICD-10-CM | POA: Diagnosis not present

## 2020-02-26 DIAGNOSIS — Z9884 Bariatric surgery status: Secondary | ICD-10-CM | POA: Insufficient documentation

## 2020-02-26 DIAGNOSIS — Z79899 Other long term (current) drug therapy: Secondary | ICD-10-CM | POA: Diagnosis not present

## 2020-02-26 DIAGNOSIS — Z833 Family history of diabetes mellitus: Secondary | ICD-10-CM | POA: Insufficient documentation

## 2020-02-26 DIAGNOSIS — D509 Iron deficiency anemia, unspecified: Secondary | ICD-10-CM | POA: Insufficient documentation

## 2020-02-26 DIAGNOSIS — E538 Deficiency of other specified B group vitamins: Secondary | ICD-10-CM | POA: Insufficient documentation

## 2020-02-26 DIAGNOSIS — D508 Other iron deficiency anemias: Secondary | ICD-10-CM

## 2020-02-26 DIAGNOSIS — K909 Intestinal malabsorption, unspecified: Secondary | ICD-10-CM | POA: Diagnosis not present

## 2020-02-26 DIAGNOSIS — E039 Hypothyroidism, unspecified: Secondary | ICD-10-CM | POA: Insufficient documentation

## 2020-02-26 DIAGNOSIS — F17210 Nicotine dependence, cigarettes, uncomplicated: Secondary | ICD-10-CM | POA: Diagnosis not present

## 2020-02-26 NOTE — Progress Notes (Signed)
[facility] [address], [place] [address]   CLINIC:  Medical Oncology/Hematology  PCP:  [name], NP [address] [place] [address] [phone]   REASON FOR VISIT: Follow-up for iron deficiency anemia   CURRENT THERAPY: Intermittent iron infusions   INTERVAL HISTORY:   [name] 65 y.o. female returns for routine follow-up follow-up for iron deficiency anemia.      She denies any or melena.  She denies any easy bruising or bleeding.  Denies any nausea, vomiting, or diarrhea. Denies any new pains. Had not noticed any recent bleeding such as epistaxis, hematuria or hematochezia. Denies recent chest pain on exertion, shortness of breath on minimal exertion, pre-syncopal episodes, or palpitations. Denies any numbness or tingling in hands or feet. Denies any recent fevers, infections, or recent hospitalizations. Patient reports appetite at 100% and energy level at 75%.     REVIEW OF SYSTEMS:  Review of Systems  All other systems reviewed and are negative.    PAST MEDICAL/SURGICAL HISTORY:  Past Medical History:  Diagnosis Date  . Anemia   . Chronic back pain   . Hypothyroidism    Past Surgical History:  Procedure Laterality Date  . CESAREAN SECTION  [id number]  . COLONOSCOPY N/A 08/07/2013   Procedure: COLONOSCOPY;  Surgeon: West Bali, MD;  [facility];  Service: Endoscopy;  Laterality: N/A;  10:30-moved to [address] notified pt  . ESOPHAGOGASTRODUODENOSCOPY    . ESOPHAGOGASTRODUODENOSCOPY N/A 08/07/2013   Procedure: ESOPHAGOGASTRODUODENOSCOPY (EGD);  Surgeon: West Bali, MD;  [facility];  Service: Endoscopy;  Laterality: N/A;  . GASTRIC BYPASS  [address] [place]  . TONSILLECTOMY     Childhood  . UPPER GI ENDOSCOPY  2004   Dr.Borhanian      SOCIAL HISTORY:  Social History   Socioeconomic History  . Marital status: Married    Spouse name: Not on file  . Number of children:  Not on file  . Years of education: Not on file  . Highest education level: Not on file  Occupational History  . Occupation: Environmental health practitioner: [employer]  Tobacco Use  . Smoking status: Current Every Day Smoker    Packs/day: 0.50    Years: 10.00    Pack years: 5.00    Types: Cigarettes  . Smokeless tobacco: Never Used  . Tobacco comment: Relasped  Vaping Use  . Vaping Use: Never used  Substance and Sexual Activity  . Alcohol use: No    Alcohol/week: 0.0 standard drinks  . Drug use: No  . Sexual activity: Yes  Other Topics Concern  . Not on file  Social History Narrative   Married, 1990, has 2 children. Patient lives in a multi-level home. Previously smoked 1/2 PPD, smoked for 10-20 years. Drinks a minimal amount of caffeinated beverages.    Social Determinants of Health   Financial Resource Strain: Not on file  Food Insecurity: Not on file  Transportation Needs: Not on file  Physical Activity: Not on file  Stress: Not on file  Social Connections: Not on file  Intimate Partner Violence: Not on file    FAMILY HISTORY:  Family History  Problem Relation Age of Onset  . Heart disease Mother   . Diabetes Mother   . Stroke Mother   . Hypertension Mother   . Diabetes Father   . Heart disease Father   . Hypertension Father   . Migraines  Daughter   . Colon cancer Neg Hx     CURRENT MEDICATIONS:  Outpatient Encounter Medications as of 02/26/2020  Medication Sig  . acetaminophen (TYLENOL) 500 MG tablet Take 500 mg by mouth every 6 (six) hours as needed for mild pain or headache.  . cyanocobalamin (,VITAMIN B-12,) 1000 MCG/ML injection INJECT INTO THE MUSCLE EVERY 21 DAYS.  Marland Kitchen escitalopram (LEXAPRO) 10 MG tablet TAKE ONE TABLET BY MOUTH ONCE DAILY.  Marland Kitchen estradiol (ESTRACE VAGINAL) 0.1 MG/GM vaginal cream Place 1 Applicatorful vaginally 3 (three) times a week.  . folic acid (FOLVITE) 1 MG tablet TAKE (1) TABLET BY MOUTH ONCE DAILY.  Marland Kitchen ibuprofen (ADVIL) 800 MG  tablet Take by mouth.  . levothyroxine (SYNTHROID) 50 MCG tablet TAKE ONE TABLET BY MOUTH ONCE DAILY.  Marland Kitchen tiZANidine (ZANAFLEX) 4 MG tablet Take 4 mg by mouth 2 (two) times daily as needed.  . Vitamin D, Ergocalciferol, (DRISDOL) 1.25 MG (50000 UNIT) CAPS capsule Take 1 capsule (50,000 Units total) by mouth once a week.  . zonisamide (ZONEGRAN) 100 MG capsule Take 200 mg by mouth daily.    No facility-administered encounter medications on file as of 02/26/2020.    ALLERGIES:  No Known Allergies   PHYSICAL EXAM:  ECOG Performance status: 1  Vitals:   02/26/20 0840  BP: 118/63  Pulse: 61  Resp: 16  Temp: 98.1 F (36.7 C)  SpO2: 100%   Filed Weights   02/26/20 0840  Weight: 187 lb (84.8 kg)   Physical Exam Constitutional:      Appearance: Normal appearance. She is normal weight.  Cardiovascular:     Rate and Rhythm: Normal rate and regular rhythm.     Heart sounds: Normal heart sounds.  Pulmonary:     Effort: Pulmonary effort is normal.     Breath sounds: Normal breath sounds.  Abdominal:     General: Bowel sounds are normal.     Palpations: Abdomen is soft.  Musculoskeletal:        General: Normal range of motion.  Skin:    General: Skin is warm.  Neurological:     Mental Status: She is alert and oriented to person, place, and time. Mental status is at baseline.  Psychiatric:        Mood and Affect: Mood normal.        Behavior: Behavior normal.        Thought Content: Thought content normal.        Judgment: Judgment normal.      LABORATORY DATA:  I have reviewed the labs as listed.  CBC    Component Value Date/Time   WBC 4.2 02/19/2020 0832   RBC 3.53 (L) 02/19/2020 0832   HGB 11.9 02/19/2020 0832   HCT 35.2 02/19/2020 0832   PLT 267 02/19/2020 0832   MCV 99.7 02/19/2020 0832   MCH 33.7 (H) 02/19/2020 0832   MCHC 33.8 02/19/2020 0832   RDW 11.8 02/19/2020 0832   LYMPHSABS 1,336 02/19/2020 0832   MONOABS 0.3 12/11/2017 0907   EOSABS 80 02/19/2020  0832   BASOSABS 21 02/19/2020 0832   CMP Latest Ref Rng & Units 02/19/2020 09/18/2019 03/06/2019  Glucose 65 - 99 mg/dL 82 76 78  BUN 7 - 25 mg/dL 14 15 15   Creatinine 0.50 - 0.99 mg/dL 9.89 2.11  Sodium 135 - 146 mmol/L 140 141 141  Potassium 3.5 - 5.3 mmol/L 4.3 4.1 4.3  Chloride 98 - 110 mmol/L 108 108 109  CO2 20 -  32 mmol/L 28 28 27   Calcium 8.6 - 10.4 mg/dL 8.9 ) 8.8  Total Protein 6.1 - 8.1 g/dL 8.5(O) 5.7(L) 5.7(L)  Total Bilirubin 0.2 - 1.2 mg/dL 0.4 0.3 0.4  Alkaline Phos 38 - 126 U/L - - -  AST 10 - 35 U/L 15 13 14   ALT 6 - 29 U/L 12 14 14     All questions were answered to patient's stated satisfaction. Encouraged patient to call with any new concerns or questions before his next visit to the cancer center and we can certain see him sooner, if needed.     ASSESSMENT & PLAN:  No problem-specific Assessment & Plan notes found for this encounter.  1.  Iron deficiency anemia: -Secondary to malabsorption from gastric bypass surgery. -Colonoscopy on 08/07/2013 with a large redundant colon and internal/external hemorrhoids. -EGD on 08/07/2013 with a Schatzki ring at GE junction and small clean-based ulcer at anastomosis. -Last Feraheme on 04/17/2019 and 04/24/2019.  This helped improve her tiredness. -Labs done recently on 02/18/2018 4200, hemoglobin of 11.9, platelet count of 267,000.  Ferritin is 221.  Vitamin D is 64 B12 B12 at 391 and folic acid of 06/17/2019 -we will hold off on iron infusions at this time. -She will follow-up in 6 months with repeat labs.  2.  Vitamin B12 deficiency: -Secondary to malabsorption from gastric bypass surgery. -Labs done on 02/19/2020, normal.  3.  Vitamin D deficiency: -She is taking 50,000 units weekly, can transition to 1000 IU daily   Orders placed this encounter:  No orders of the defined types were placed in this encounter.

## 2020-03-08 ENCOUNTER — Other Ambulatory Visit: Payer: Self-pay | Admitting: Nurse Practitioner

## 2020-03-08 NOTE — Telephone Encounter (Signed)
High risk or very high risk warning populated when attempting to refill medication. RX request sent to PCP for review and approval if warranted.   

## 2020-03-18 ENCOUNTER — Encounter: Payer: Self-pay | Admitting: Nurse Practitioner

## 2020-03-18 ENCOUNTER — Ambulatory Visit: Payer: BC Managed Care – PPO | Admitting: Nurse Practitioner

## 2020-03-18 ENCOUNTER — Other Ambulatory Visit: Payer: Self-pay

## 2020-03-18 VITALS — BP 110/80 | HR 58 | Temp 97.3°F | Ht 67.5 in

## 2020-03-18 DIAGNOSIS — D508 Other iron deficiency anemias: Secondary | ICD-10-CM

## 2020-03-18 DIAGNOSIS — E034 Atrophy of thyroid (acquired): Secondary | ICD-10-CM | POA: Diagnosis not present

## 2020-03-18 DIAGNOSIS — E785 Hyperlipidemia, unspecified: Secondary | ICD-10-CM

## 2020-03-18 DIAGNOSIS — E559 Vitamin D deficiency, unspecified: Secondary | ICD-10-CM | POA: Diagnosis not present

## 2020-03-18 DIAGNOSIS — E119 Type 2 diabetes mellitus without complications: Secondary | ICD-10-CM

## 2020-03-18 DIAGNOSIS — E538 Deficiency of other specified B group vitamins: Secondary | ICD-10-CM

## 2020-03-18 DIAGNOSIS — F325 Major depressive disorder, single episode, in full remission: Secondary | ICD-10-CM

## 2020-03-18 NOTE — Progress Notes (Signed)
Careteam: Patient Care Team: [name], NP as PCP - General (Geriatric Medicine) [name], DO as Consulting Physician (Geriatric Medicine) [name], MD as Consulting Physician (Hematology) Jethro Bolus, MD as Consulting Physician (Ophthalmology) Haverstock, Elvin So, MD as Referring Physician (Dermatology)  PLACE OF SERVICE:  [facility]  Advanced Directive information    No Known Allergies  Chief Complaint  Patient presents with  . Medical Management of Chronic Issues    6 month follow up.     HPI: Patient is a 65 y.o. female for follow up  Went to hematologist and followed up- blood work was in normal range.   Vit d def- vit d 50,000 was stopped since she was started on prevagen (which is primarily vit d)   No increase in anxiety or depression- doing well on lexapro  A1c continues to be in nondiabetic range since gastric bypass.   Reports she felt vaginal dryness which made her feels awful so she got the prescription. She has not felt awful since.   Last PAP was last year  Due for mammogram- plans to make appt.   Had  COVID booster  Review of Systems:  Review of Systems  Constitutional: Negative for chills, fever and weight loss.  HENT: Negative for tinnitus.   Respiratory: Negative for cough, sputum production and shortness of breath.   Cardiovascular: Negative for chest pain, palpitations and leg swelling.  Gastrointestinal: Negative for abdominal pain, constipation, diarrhea and heartburn.  Genitourinary: Negative for dysuria, frequency and urgency.  Musculoskeletal: Negative for back pain, falls, joint pain and myalgias.  Skin: Negative.   Neurological: Negative for dizziness and headaches.  Psychiatric/Behavioral: Negative for depression and memory loss. The patient does not have insomnia.     Past Medical History:  Diagnosis Date  . Anemia   . Chronic back pain   . Hypothyroidism    Past Surgical History:   Procedure Laterality Date  . CESAREAN SECTION  [id number]  . COLONOSCOPY N/A 08/07/2013   Procedure: COLONOSCOPY;  Surgeon: West Bali, MD;  [facility];  Service: Endoscopy;  Laterality: N/A;  10:30-moved to [address] notified pt  . ESOPHAGOGASTRODUODENOSCOPY    . ESOPHAGOGASTRODUODENOSCOPY N/A 08/07/2013   Procedure: ESOPHAGOGASTRODUODENOSCOPY (EGD);  Surgeon: West Bali, MD;  [facility];  Service: Endoscopy;  Laterality: N/A;  . GASTRIC BYPASS  [address] [place]  . TONSILLECTOMY     Childhood  . UPPER GI ENDOSCOPY  2004   Dr.Borhanian    Social History:   reports that she has been smoking cigarettes. She has a 5.00 pack-year smoking history. She has never used smokeless tobacco. She reports that she does not drink alcohol and does not use drugs.  Family History  Problem Relation Age of Onset  . Heart disease Mother   . Diabetes Mother   . Stroke Mother   . Hypertension Mother   . Diabetes Father   . Heart disease Father   . Hypertension Father   . Migraines Daughter   . Colon cancer Neg Hx     Medications: Patient's Medications  New Prescriptions   No medications on file  Previous Medications   ACETAMINOPHEN (TYLENOL) 500 MG TABLET    Take 500 mg by mouth every 6 (six) hours as needed for mild pain or headache.   CYANOCOBALAMIN (,VITAMIN B-12,) 1000 MCG/ML INJECTION    INJECT INTO THE MUSCLE EVERY 21 DAYS.   ESCITALOPRAM (LEXAPRO) 10  MG TABLET    TAKE ONE TABLET BY MOUTH ONCE DAILY.   ESTRADIOL (ESTRACE VAGINAL) 0.1 MG/GM VAGINAL CREAM    Place 1 Applicatorful vaginally 3 (three) times a week.   FOLIC ACID (FOLVITE) 1 MG TABLET    TAKE (1) TABLET BY MOUTH ONCE DAILY.   IBUPROFEN (ADVIL) 800 MG TABLET    Take by mouth.   LEVOTHYROXINE (SYNTHROID) 50 MCG TABLET    TAKE ONE TABLET BY MOUTH ONCE DAILY.   TIZANIDINE (ZANAFLEX) 4 MG TABLET    Take 4 mg by mouth 2 (two) times daily as needed.   ZONISAMIDE (ZONEGRAN) 100 MG CAPSULE     Take 200 mg by mouth daily.   Modified Medications   No medications on file  Discontinued Medications   VITAMIN D, ERGOCALCIFEROL, (DRISDOL) 1.25 MG (50000 UNIT) CAPS CAPSULE    Take 1 capsule (50,000 Units total) by mouth once a week.    Physical Exam:  Vitals:   03/18/20 1007  BP: 110/80  Pulse: (!) 58  Temp: (!) 97.3 F (36.3 C)  TempSrc: Temporal  SpO2: 98%  Height: 5' 7.5" (1.715 m)   Body mass index is 28.86 kg/m. Wt Readings from Last 3 Encounters:  02/26/20 187 lb (84.8 kg)  09/25/19 188 lb (85.3 kg)  09/18/19 189 lb (85.7 kg)    Physical Exam Constitutional:      General: She is not in acute distress.    Appearance: She is well-developed. She is not diaphoretic.  HENT:     Head: Normocephalic and atraumatic.     Mouth/Throat:     Pharynx: No oropharyngeal exudate.  Eyes:     Conjunctiva/sclera: Conjunctivae normal.     Pupils: Pupils are equal, round, and reactive to light.  Cardiovascular:     Rate and Rhythm: Normal rate and regular rhythm.     Heart sounds: Normal heart sounds.  Pulmonary:     Effort: Pulmonary effort is normal.     Breath sounds: Normal breath sounds.  Abdominal:     General: Bowel sounds are normal.     Palpations: Abdomen is soft.  Musculoskeletal:        General: No tenderness.     Cervical back: Normal range of motion and neck supple.  Skin:    General: Skin is warm and dry.  Neurological:     Mental Status: She is alert and oriented to person, place, and time.     Labs reviewed: Basic Metabolic Panel: Recent Labs    09/18/19 0959 02/19/20 0832  NA 141 140  K 4.1 4.3  CL 108 108  CO2 28 28  GLUCOSE 76 82  BUN 15 14  CREATININE 0.83 0.81  CALCIUM 8.4* 8.9  TSH 3.31  --    Liver Function Tests: Recent Labs    09/18/19 0959 02/19/20 0832  AST 13 15  ALT 14 12  BILITOT 0.3 0.4  PROT 5.7* 5.6*   No results for input(s): LIPASE, AMYLASE in the last 8760 hours. No results for input(s): AMMONIA in the last  8760 hours. CBC: Recent Labs    09/18/19 0959 02/19/20 0832  WBC 4.1 4.2  NEUTROABS 2,362 2,381  HGB 12.0 11.9  HCT 36.3 35.2  MCV 100.6* 99.7  PLT 296 267   Lipid Panel: Recent Labs    09/18/19 0959  CHOL 183  HDL 68  LDLCALC 98  TRIG 83  CHOLHDL 2.7   TSH: Recent Labs    09/18/19 0959  TSH 3.31  A1C: Lab Results  Component Value Date   HGBA1C 5.2 09/18/2019     Assessment/Plan 1. Other iron deficiency anemia Followed by hematology, recent lab work stable.   2. Type 2 diabetes mellitus without complication, without long-term current use of insulin (HCC) She has been well controlled since her gastric bypass with A1c consistent with the absence of diabetes for last 5 years. She recently had blood drawn from hematologist so will wait until next follow up to obtain a1c  3. Vitamin D deficiency -continues on supplment daily.  4. Hypothyroidism due to acquired atrophy of thyroid TSH at goal on synthroid 50 mcg  5. B12 deficiency -continues on injection every 3 weeks  6. Hyperlipidemia, unspecified hyperlipidemia type -cholesterol at goal with diet modifications. To continue lifestyle modifications.   7. Major depression, single episode, in complete remission (HCC) -well controlled on lexapro  Next appt: 5 months, labs at appt.  Janene Harvey. Biagio Borg  [facility] [phone]

## 2020-03-25 DIAGNOSIS — F4323 Adjustment disorder with mixed anxiety and depressed mood: Secondary | ICD-10-CM | POA: Diagnosis not present

## 2020-04-15 DIAGNOSIS — G43719 Chronic migraine without aura, intractable, without status migrainosus: Secondary | ICD-10-CM | POA: Diagnosis not present

## 2020-04-15 DIAGNOSIS — G43111 Migraine with aura, intractable, with status migrainosus: Secondary | ICD-10-CM | POA: Diagnosis not present

## 2020-04-15 DIAGNOSIS — M542 Cervicalgia: Secondary | ICD-10-CM | POA: Diagnosis not present

## 2020-06-03 DIAGNOSIS — F4323 Adjustment disorder with mixed anxiety and depressed mood: Secondary | ICD-10-CM | POA: Diagnosis not present

## 2020-06-06 ENCOUNTER — Other Ambulatory Visit: Payer: Self-pay | Admitting: Nurse Practitioner

## 2020-06-06 ENCOUNTER — Other Ambulatory Visit (HOSPITAL_COMMUNITY): Payer: Self-pay | Admitting: Hematology

## 2020-06-07 ENCOUNTER — Encounter (HOSPITAL_COMMUNITY): Payer: Self-pay | Admitting: Hematology

## 2020-07-02 ENCOUNTER — Other Ambulatory Visit: Payer: Self-pay | Admitting: Nurse Practitioner

## 2020-07-04 NOTE — Telephone Encounter (Signed)
Pharmacy requested refill. Pended Rx and sent to Jessica for approval due to HIGH ALERT Warning.  

## 2020-07-29 DIAGNOSIS — F4323 Adjustment disorder with mixed anxiety and depressed mood: Secondary | ICD-10-CM | POA: Diagnosis not present

## 2020-08-19 ENCOUNTER — Other Ambulatory Visit: Payer: Self-pay

## 2020-08-19 ENCOUNTER — Ambulatory Visit: Payer: BC Managed Care – PPO | Admitting: Nurse Practitioner

## 2020-08-19 ENCOUNTER — Encounter: Payer: Self-pay | Admitting: Nurse Practitioner

## 2020-08-19 VITALS — BP 118/79 | HR 63 | Temp 97.1°F | Ht 67.5 in | Wt 188.8 lb

## 2020-08-19 DIAGNOSIS — K912 Postsurgical malabsorption, not elsewhere classified: Secondary | ICD-10-CM

## 2020-08-19 DIAGNOSIS — E538 Deficiency of other specified B group vitamins: Secondary | ICD-10-CM

## 2020-08-19 DIAGNOSIS — E119 Type 2 diabetes mellitus without complications: Secondary | ICD-10-CM

## 2020-08-19 DIAGNOSIS — E034 Atrophy of thyroid (acquired): Secondary | ICD-10-CM | POA: Diagnosis not present

## 2020-08-19 DIAGNOSIS — D508 Other iron deficiency anemias: Secondary | ICD-10-CM

## 2020-08-19 DIAGNOSIS — E559 Vitamin D deficiency, unspecified: Secondary | ICD-10-CM

## 2020-08-19 DIAGNOSIS — K909 Intestinal malabsorption, unspecified: Secondary | ICD-10-CM | POA: Diagnosis not present

## 2020-08-19 DIAGNOSIS — E785 Hyperlipidemia, unspecified: Secondary | ICD-10-CM

## 2020-08-19 NOTE — Patient Instructions (Addendum)
TO GET [facility] AND COVID AT LOCAL PHARMACY   To schedule MAMMOGRAM  To schedule ophthalmology appt

## 2020-08-19 NOTE — Progress Notes (Signed)
Careteam: Patient Care Team: Lauree Chandler, NP as PCP - General (Geriatric Medicine) Gayland Curry, DO as Consulting Physician (Geriatric Medicine) Derek Jack, MD as Consulting Physician (Hematology) Rutherford Guys, MD as Consulting Physician (Ophthalmology) Haverstock, Jennefer Bravo, MD as Referring Physician (Dermatology)  PLACE OF SERVICE:  [place] Directive information    No Known Allergies  Chief Complaint  Patient presents with   Medical Management of Chronic Issues    5 month follow up   Health Maintenance    Zoster, 2nd COVID booster, Urine mircroalbumin     HPI: Patient is a 65 y.o. female for routine follow up.   Due for another COVID booster- education provided.   DM- has appt for eye exam.   Due to for mammogram and planning on scheduling this.   Continues to smoke but also chews gum to help curb.   Review of Systems:  Review of Systems  Constitutional:  Negative for chills, fever and weight loss.  HENT:  Negative for tinnitus.   Respiratory:  Negative for cough, sputum production and shortness of breath.   Cardiovascular:  Negative for chest pain, palpitations and leg swelling.  Gastrointestinal:  Negative for abdominal pain, constipation, diarrhea and heartburn.  Genitourinary:  Negative for dysuria, frequency and urgency.  Musculoskeletal:  Negative for back pain, falls, joint pain and myalgias.  Skin: Negative.   Neurological:  Negative for dizziness and headaches.  Psychiatric/Behavioral:  Negative for depression and memory loss. The patient does not have insomnia.    Past Medical History:  Diagnosis Date   Anemia    Chronic back pain    Hypothyroidism    Past Surgical History:  Procedure Laterality Date   CESAREAN SECTION  6144,3154   COLONOSCOPY N/A 08/07/2013   Procedure: COLONOSCOPY;  Surgeon: Danie Binder, MD;  [facility];  Service: Endoscopy;  Laterality: N/A;  10:30-moved to [place]  notified pt   ESOPHAGOGASTRODUODENOSCOPY     ESOPHAGOGASTRODUODENOSCOPY N/A 08/07/2013   Procedure: ESOPHAGOGASTRODUODENOSCOPY (EGD);  Surgeon: Danie Binder, MD;  [facility];  Service: Endoscopy;  Laterality: N/A;   GASTRIC BYPASS  2004   [place], [place]     Childhood   UPPER GI ENDOSCOPY  2004   Dr.Borhanian    Social History:   reports that she has been smoking cigarettes. She has a 5.00 pack-year smoking history. She has never used smokeless tobacco. She reports that she does not drink alcohol and does not use drugs.  Family History  Problem Relation Age of Onset   Heart disease Mother    Diabetes Mother    Stroke Mother    Hypertension Mother    Diabetes Father    Heart disease Father    Hypertension Father    Migraines Daughter    Colon cancer Neg Hx     Medications: Patient's Medications  New Prescriptions   No medications on file  Previous Medications   ACETAMINOPHEN (TYLENOL) 500 MG TABLET    Take 500 mg by mouth every 6 (six) hours as needed for mild pain or headache.   CYANOCOBALAMIN (,VITAMIN B-12,) 1000 MCG/ML INJECTION    INJECT 1ML INTO THE MUSCLE EVERY 21 DAYS.   ESCITALOPRAM (LEXAPRO) 10 MG TABLET    TAKE ONE TABLET BY MOUTH ONCE DAILY.   ESTRADIOL (ESTRACE VAGINAL) 0.1 MG/GM VAGINAL CREAM    Place 1 Applicatorful vaginally 3 (three) times a week.   FOLIC ACID (FOLVITE)  1 MG TABLET    TAKE (1) TABLET BY MOUTH ONCE DAILY.   IBUPROFEN (ADVIL) 800 MG TABLET    Take by mouth.   LEVOTHYROXINE (SYNTHROID) 50 MCG TABLET    TAKE ONE TABLET BY MOUTH ONCE DAILY.   TIZANIDINE (ZANAFLEX) 4 MG TABLET    Take 4 mg by mouth 2 (two) times daily as needed.   VITAMIN D, ERGOCALCIFEROL, (DRISDOL) 1.25 MG (50000 UNIT) CAPS CAPSULE    TAKE 1 CAPSULE BY MOUTH ONCE A WEEK.   ZONISAMIDE (ZONEGRAN) 100 MG CAPSULE    Take 200 mg by mouth daily.   Modified Medications   No medications on file  Discontinued Medications   No medications on file     Physical Exam:  There were no vitals filed for this visit. There is no height or weight on file to calculate BMI. Wt Readings from Last 3 Encounters:  02/26/20 187 lb (84.8 kg)  09/25/19 188 lb (85.3 kg)  09/18/19 189 lb (85.7 kg)    Physical Exam Constitutional:      General: She is not in acute distress.    Appearance: She is well-developed. She is not diaphoretic.  HENT:     Head: Normocephalic and atraumatic.     Mouth/Throat:     Pharynx: No oropharyngeal exudate.  Eyes:     Conjunctiva/sclera: Conjunctivae normal.     Pupils: Pupils are equal, round, and reactive to light.  Cardiovascular:     Rate and Rhythm: Normal rate and regular rhythm.     Heart sounds: Normal heart sounds.  Pulmonary:     Effort: Pulmonary effort is normal.     Breath sounds: Normal breath sounds.  Abdominal:     General: Bowel sounds are normal.     Palpations: Abdomen is soft.  Musculoskeletal:     Cervical back: Normal range of motion and neck supple.     Right lower leg: No edema.     Left lower leg: No edema.  Skin:    General: Skin is warm and dry.  Neurological:     Mental Status: She is alert.  Psychiatric:        Mood and Affect: Mood normal.    Labs reviewed: Basic Metabolic Panel: Recent Labs    09/18/19 0959 02/19/20 0832  NA 141 140  K 4.1 4.3  CL 108 108  CO2 28 28  GLUCOSE 76 82  BUN 15 14  CREATININE 0.83 0.81  CALCIUM 8.4* 8.9  TSH 3.31  --    Liver Function Tests: Recent Labs    09/18/19 0959 02/19/20 0832  AST 13 15  ALT 14 12  BILITOT 0.3 0.4  PROT 5.7* 5.6*   No results for input(s): LIPASE, AMYLASE in the last 8760 hours. No results for input(s): AMMONIA in the last 8760 hours. CBC: Recent Labs    09/18/19 0959 02/19/20 0832  WBC 4.1 4.2  NEUTROABS 2,362 2,381  HGB 12.0 11.9  HCT 36.3 35.2  MCV 100.6* 99.7  PLT 296 267   Lipid Panel: Recent Labs    09/18/19 0959  CHOL 183  HDL 68  LDLCALC 98  TRIG 83  CHOLHDL 2.7    TSH: Recent Labs    09/18/19 0959  TSH 3.31   A1C: Lab Results  Component Value Date   HGBA1C 5.2 09/18/2019     Assessment/Plan 1. Other iron deficiency anemia -followed by hematology - Iron, TIBC and Ferritin Panel - CBC with Differential/Platelet  2. Postoperative malabsorption -  Iron, TIBC and Ferritin Panel - CMP with eGFR([facility]) - CBC with Differential/Platelet - Vitamin D, 25-hydroxy - B12 and Folate Panel  3. Hypothyroidism due to acquired atrophy of thyroid Continues on synthroid 50 mcg - TSH  4. Malabsorption of iron -due to gastric bypass - Iron, TIBC and Ferritin Panel - CBC with Differential/Platelet  5. Type 2 diabetes mellitus without complication, without long-term current use of insulin (HCC) -diet controlled, continue compliance, routine foot care/monitoring and to keep up with diabetic eye exams through ophthalmology  - Hemoglobin A1c - Microalbumin, urine  6. Vitamin D deficiency -continue vit d supplement -vit D  7. B12 deficiency -continues on supplement  8. Hyperlipidemia, unspecified hyperlipidemia type -diet controlled.  - Lipid panel  9. Low folate Continues on folic acid 1 mg daily - B12 and Folate Panel   Next appt: 6 months. Carlos American. [place] [place] [facility] [phone]

## 2020-08-24 LAB — IRON,TIBC AND FERRITIN PANEL
%SAT: 36 % (calc) (ref 16–45)
Ferritin: 223 ng/mL (ref 16–288)
Iron: 97 ug/dL (ref 45–160)
TIBC: 266 mcg/dL (calc) (ref 250–450)

## 2020-08-24 LAB — COMPLETE METABOLIC PANEL WITH GFR
AG Ratio: 2 (calc) (ref 1.0–2.5)
ALT: 20 U/L (ref 6–29)
AST: 19 U/L (ref 10–35)
Albumin: 3.9 g/dL (ref 3.6–5.1)
Alkaline phosphatase (APISO): 87 U/L (ref 37–153)
BUN: 14 mg/dL (ref 7–25)
CO2: 29 mmol/L (ref 20–32)
Calcium: 9.2 mg/dL (ref 8.6–10.4)
Chloride: 113 mmol/L — ABNORMAL HIGH (ref 98–110)
Creat: 0.91 mg/dL (ref 0.50–1.05)
Globulin: 2 g/dL (calc) (ref 1.9–3.7)
Glucose, Bld: 72 mg/dL (ref 65–99)
Potassium: 4.3 mmol/L (ref 3.5–5.3)
Sodium: 144 mmol/L (ref 135–146)
Total Bilirubin: 0.3 mg/dL (ref 0.2–1.2)
Total Protein: 5.9 g/dL — ABNORMAL LOW (ref 6.1–8.1)
eGFR: 70 mL/min/{1.73_m2} (ref >=60)

## 2020-08-24 LAB — CBC WITH DIFFERENTIAL/PLATELET
Absolute Monocytes: 455 cells/uL (ref 200–950)
Basophils Absolute: 18 cells/uL (ref 0–200)
Basophils Relative: 0.4 %
Eosinophils Absolute: 99 cells/uL (ref 15–500)
Eosinophils Relative: 2.2 %
HCT: 37.7 % (ref 35.0–45.0)
Hemoglobin: 12.6 g/dL (ref 11.7–15.5)
Lymphs Abs: 986 cells/uL (ref 850–3900)
MCH: 33.4 pg — ABNORMAL HIGH (ref 27.0–33.0)
MCHC: 33.4 g/dL (ref 32.0–36.0)
MCV: 100 fL (ref 80.0–100.0)
MPV: 10.1 fL (ref 7.5–12.5)
Monocytes Relative: 10.1 %
Neutro Abs: 2943 cells/uL (ref 1500–7800)
Neutrophils Relative %: 65.4 %
Platelets: 311 10*3/uL (ref 140–400)
RBC: 3.77 10*6/uL — ABNORMAL LOW (ref 3.80–5.10)
RDW: 11.7 % (ref 11.0–15.0)
Total Lymphocyte: 21.9 %
WBC: 4.5 10*3/uL (ref 3.8–10.8)

## 2020-08-24 LAB — VITAMIN D 25 HYDROXY (VIT D DEFICIENCY, FRACTURES): Vit D, 25-Hydroxy: 62 ng/mL (ref 30–100)

## 2020-08-24 LAB — HEMOGLOBIN A1C
Hgb A1c MFr Bld: 5.2 % of total Hgb (ref <5.7)
Mean Plasma Glucose: 103 mg/dL
eAG (mmol/L): 5.7 mmol/L

## 2020-08-24 LAB — LIPID PANEL
Cholesterol: 199 mg/dL (ref <200)
HDL: 68 mg/dL (ref >=50)
LDL Cholesterol (Calc): 109 mg/dL (calc) — ABNORMAL HIGH
Non-HDL Cholesterol (Calc): 131 mg/dL (calc) — ABNORMAL HIGH (ref <130)
Total CHOL/HDL Ratio: 2.9 (calc) (ref <5.0)
Triglycerides: 108 mg/dL (ref <150)

## 2020-08-24 LAB — MICROALBUMIN, URINE: Microalb, Ur: <0.2 mg/dL

## 2020-08-24 LAB — TSH: TSH: 3.43 mIU/L (ref 0.40–4.50)

## 2020-08-24 LAB — B12 AND FOLATE PANEL
Folate: 12 ng/mL
Vitamin B-12: 311 pg/mL (ref 200–1100)

## 2020-08-30 NOTE — Progress Notes (Signed)
[facility] [address], [place] [address]   CLINIC:  Medical Oncology/Hematology  PCP:  [name], NP [address] / [place] [address]  [phone]  REASON FOR VISIT:  Follow-up for iron deficiency anemia and B12 deficiency  CURRENT THERAPY:  Intermittent iron infusions and B12 injections   INTERVAL HISTORY:  April [name], a 65 y.o. female, returns for routine follow-up for her iron deficiency anemia and B12 deficiency. April [name] was last seen on 02/26/20 by Dr. Rachel Moulds.  Today she reports feeling good. She is not currently taking iron tablets, and she is currently working 4 days a week. She denies weight loss, fevers, night sweats, and infections.   REVIEW OF SYSTEMS:  Review of Systems  Constitutional:  Negative for appetite change, fatigue, fever and unexpected weight change.  Neurological:  Positive for headaches (migranes).  All other systems reviewed and are negative.  PAST MEDICAL/SURGICAL HISTORY:  Past Medical History:  Diagnosis Date   Anemia    Chronic back pain    Hypothyroidism    Past Surgical History:  Procedure Laterality Date   CESAREAN SECTION  0867,6195   COLONOSCOPY N/A 08/07/2013   Procedure: COLONOSCOPY;  Surgeon: West Bali, MD;  [facility];  Service: Endoscopy;  Laterality: N/A;  10:30-moved to [address] notified pt   ESOPHAGOGASTRODUODENOSCOPY     ESOPHAGOGASTRODUODENOSCOPY N/A 08/07/2013   Procedure: ESOPHAGOGASTRODUODENOSCOPY (EGD);  Surgeon: West Bali, MD;  [facility];  Service: Endoscopy;  Laterality: N/A;   GASTRIC BYPASS  2004   [place], [place]   TONSILLECTOMY     Childhood   UPPER GI ENDOSCOPY  2004   Dr.Borhanian     SOCIAL HISTORY:  Social History   Socioeconomic History   Marital status: Married    Spouse name: Not on file   Number of children: Not on file   Years of education: Not on file   Highest education level: Not on file   Occupational History   Occupation: Dentist    Employer: [employer]  Tobacco Use   Smoking status: Every Day    Packs/day: 0.50    Years: 10.00    Pack years: 5.00    Types: Cigarettes   Smokeless tobacco: Never   Tobacco comments:    Relasped  Vaping Use   Vaping Use: Never used  Substance and Sexual Activity   Alcohol use: No    Alcohol/week: 0.0 standard drinks   Drug use: No   Sexual activity: Yes  Other Topics Concern   Not on file  Social History Narrative   Married, 1990, has 2 children. Patient lives in a multi-level home. Previously smoked 1/2 PPD, smoked for 10-20 years. Drinks a minimal amount of caffeinated beverages.    Social Determinants of Health   Financial Resource Strain: Not on file  Food Insecurity: Not on file  Transportation Needs: Not on file  Physical Activity: Not on file  Stress: Not on file  Social Connections: Not on file  Intimate Partner Violence: Not on file    FAMILY HISTORY:  Family History  Problem Relation Age of Onset   Heart disease Mother    Diabetes Mother    Stroke Mother    Hypertension Mother    Diabetes Father    Heart disease Father    Hypertension Father    Migraines Daughter    Colon cancer Neg Hx     CURRENT MEDICATIONS:  Current Outpatient Medications  Medication Sig Dispense Refill   acetaminophen (TYLENOL) 500 MG tablet Take 500 mg by mouth every 6 (six) hours as needed for mild pain or headache.     cyanocobalamin (,VITAMIN B-12,) 1000 MCG/ML injection INJECT INTO THE MUSCLE EVERY 21 DAYS. 1 mL 0   escitalopram (LEXAPRO) 10 MG tablet TAKE ONE TABLET BY MOUTH ONCE DAILY. 90 tablet 0   estradiol (ESTRACE VAGINAL) 0.1 MG/GM vaginal cream Place 1 Applicatorful vaginally 3 (three) times a week. 42.5 g 12   folic acid (FOLVITE) 1 MG tablet TAKE (1) TABLET BY MOUTH ONCE DAILY. 30 tablet 6   ibuprofen (ADVIL) 800 MG tablet Take by mouth as needed.     levothyroxine (SYNTHROID) 50 MCG tablet TAKE ONE  TABLET BY MOUTH ONCE DAILY. 90 tablet 0   tiZANidine (ZANAFLEX) 4 MG tablet Take 4 mg by mouth 2 (two) times daily as needed.     Vitamin D, Ergocalciferol, (DRISDOL) 1.25 MG (50000 UNIT) CAPS capsule TAKE 1 CAPSULE BY MOUTH ONCE A WEEK. 12 capsule 0   zonisamide (ZONEGRAN) 100 MG capsule Take 200 mg by mouth daily.   4   No current facility-administered medications for this visit.    ALLERGIES:  No Known Allergies  PHYSICAL EXAM:  Performance status (ECOG): 0 - Asymptomatic  There were no vitals filed for this visit. Wt Readings from Last 3 Encounters:  08/19/20 188 lb 12.8 oz (85.6 kg)  02/26/20 187 lb (84.8 kg)  09/25/19 188 lb (85.3 kg)   Physical Exam Vitals reviewed.  Constitutional:      Appearance: Normal appearance.  Cardiovascular:     Rate and Rhythm: Normal rate and regular rhythm.     Pulses: Normal pulses.     Heart sounds: Normal heart sounds.  Pulmonary:     Effort: Pulmonary effort is normal.     Breath sounds: Normal breath sounds.  Neurological:     General: No focal deficit present.     Mental Status: She is alert and oriented to person, place, and time.  Psychiatric:        Mood and Affect: Mood normal.        Behavior: Behavior normal.    LABORATORY DATA:  I have reviewed the labs as listed.  CBC Latest Ref Rng & Units 08/19/2020 02/19/2020 09/18/2019  WBC 3.8 - 10.8 Thousand/uL 4.5 4.2 4.1  Hemoglobin 11.7 - 15.5 g/dL 44.3 15.4 00.8  Hematocrit 35.0 - 45.0 % 37.7 35.2 36.3  Platelets 140 - 400 Thousand/uL 311 267 296   CMP Latest Ref Rng & Units 08/19/2020 02/19/2020 09/18/2019  Glucose 65 - 99 mg/dL 72 82 76  BUN 7 - 25 mg/dL 14 14 15   Creatinine 0.50 - 1.05 mg/dL 6.76 1.95  Sodium 135 - 146 mmol/L 144 140 141  Potassium 3.5 - 5.3 mmol/L 4.3 4.3 4.1  Chloride 98 - 110 mmol/L 113(H) 108 108  CO2 20 - 32 mmol/L 29 28 28   Calcium 8.6 - 10.4 mg/dL 9.2 8.9 0.93)  Total Protein 6.1 - 8.1 g/dL 5.9(L) 5.6(L) 5.7(L)  Total Bilirubin 0.2 - 1.2  mg/dL 0.3 0.4 0.3  Alkaline Phos 38 - 126 U/L - - -  AST 10 - 35 U/L 19 15 13   ALT 6 - 29 U/L 20 12 14       Component Value Date/Time   RBC 3.77 (L) 08/19/2020 0945   MCV 100.0 08/19/2020 0945   MCH 33.4 (H) 08/19/2020 0945   MCHC  33.4 08/19/2020 0945   RDW 11.7 08/19/2020 0945   LYMPHSABS 986 08/19/2020 0945   MONOABS 0.3 12/11/2017 0907   EOSABS 99 08/19/2020 0945   BASOSABS 18 08/19/2020 0945    DIAGNOSTIC IMAGING:  I have independently reviewed the scans and discussed with the patient. No results found.   ASSESSMENT:  1. Iron deficiency anemia: - Secondary to malabsorption from gastric bypass surgery.  She is on intermittent Feraheme infusions, last one on 09/28/2016 and 10/12/2016. - Colonoscopy on 08/07/2013 with a large redundant colon and internal/external hemorrhoids. -EGD on 08/07/2013 with a Schatzki ring at GE junction and a small clean-based ulcer at anastomosis. -Last Feraheme was on 06/21/2017.  Her tiredness improved after that.    2.  B12 deficiency: -Secondary to malabsorption from gastric bypass surgery.   3.  Vitamin D deficiency: - She is taking 50,000 units weekly.    PLAN:  1. Iron deficiency anemia: - Reviewed her labs from 08/19/2020.  Ferritin is 223 and stable for the last 1 year.  Hemoglobin is 12.6. - Recommend follow-up in 1 year.  2.  B12 deficiency: - Continue B12 injections monthly.  B12 level is normal.   3.  Vitamin D deficiency: - Vitamin D level was 60.  I have recommended that she may discontinue prescription vitamin D and start taking vitamin D 5000 units daily.   Orders placed this encounter:  No orders of the defined types were placed in this encounter.    [name], MD [facility] [phone]   I, Alda Ponder, am acting as a scribe for Dr. [name].  I, [name] MD, have reviewed the above documentation for accuracy and completeness, and I agree with the above.

## 2020-08-31 ENCOUNTER — Inpatient Hospital Stay (HOSPITAL_COMMUNITY): Payer: BC Managed Care – PPO | Attending: Hematology | Admitting: Hematology

## 2020-08-31 ENCOUNTER — Other Ambulatory Visit: Payer: Self-pay

## 2020-08-31 VITALS — BP 124/62 | HR 87 | Temp 96.8°F | Resp 18 | Wt 191.4 lb

## 2020-08-31 DIAGNOSIS — E559 Vitamin D deficiency, unspecified: Secondary | ICD-10-CM | POA: Insufficient documentation

## 2020-08-31 DIAGNOSIS — E538 Deficiency of other specified B group vitamins: Secondary | ICD-10-CM | POA: Diagnosis not present

## 2020-08-31 DIAGNOSIS — D509 Iron deficiency anemia, unspecified: Secondary | ICD-10-CM | POA: Diagnosis not present

## 2020-08-31 DIAGNOSIS — D508 Other iron deficiency anemias: Secondary | ICD-10-CM

## 2020-08-31 DIAGNOSIS — E039 Hypothyroidism, unspecified: Secondary | ICD-10-CM | POA: Insufficient documentation

## 2020-08-31 NOTE — Patient Instructions (Addendum)
[facility] at [facility] Discharge Instructions  You were seen today by Dr. Katragadda. He went over your recent results. Dr. Katragadda will see you back in 1 year for labs and follow up.   Thank you for choosing  Cancer Center at [facility] to provide your oncology and hematology care.  To afford each patient quality time with our provider, please arrive at least 15 minutes before your scheduled appointment time.   If you have a lab appointment with the Cancer Center please come in thru the Main Entrance and check in at the main information desk  You need to re-schedule your appointment should you arrive 10 or more minutes late.  We strive to give you quality time with our providers, and arriving late affects you and other patients whose appointments are after yours.  Also, if you no show three or more times for appointments you may be dismissed from the clinic at the providers discretion.     Again, thank you for choosing [facility].  Our hope is that these requests will decrease the amount of time that you wait before being seen by our physicians.       _____________________________________________________________  Should you have questions after your visit to  Cancer Center, please contact our office at [phone] between the hours of 8:00 a.m. and 4:30 p.m.  Voicemails left after 4:00 p.m. will not be returned until the following business day.  For prescription refill requests, have your pharmacy contact our office and allow 72 hours.    Cancer Center Support Programs:   > Cancer Support Group  2nd Tuesday of the month 1pm-2pm, Journey Room    

## 2020-09-01 ENCOUNTER — Encounter (HOSPITAL_COMMUNITY): Payer: Self-pay | Admitting: Hematology

## 2020-09-05 DIAGNOSIS — Z8616 Personal history of COVID-19: Secondary | ICD-10-CM

## 2020-09-05 HISTORY — DX: Personal history of COVID-19: Z86.16

## 2020-09-06 ENCOUNTER — Other Ambulatory Visit: Payer: Self-pay

## 2020-09-06 ENCOUNTER — Telehealth (INDEPENDENT_AMBULATORY_CARE_PROVIDER_SITE_OTHER): Payer: BC Managed Care – PPO | Admitting: Nurse Practitioner

## 2020-09-06 ENCOUNTER — Telehealth: Payer: Self-pay

## 2020-09-06 DIAGNOSIS — U071 COVID-19: Secondary | ICD-10-CM

## 2020-09-06 NOTE — Telephone Encounter (Signed)
April [name], April [name] are scheduled for a virtual visit with your provider today.    Just as we do with appointments in the office, we must obtain your consent to participate.  Your consent will be active for this visit and any virtual visit you may have with one of our providers in the next 365 days.    If you have a MyChart account, I can also send a copy of this consent to you electronically.  All virtual visits are billed to your insurance company just like a traditional visit in the office.  As this is a virtual visit, video technology does not allow for your provider to perform a traditional examination.  This may limit your provider's ability to fully assess your condition.  If your provider identifies any concerns that need to be evaluated in person or the need to arrange testing such as labs, EKG, etc, we will make arrangements to do so.    Although advances in technology are sophisticated, we cannot ensure that it will always work on either your end or our end.  If the connection with a video visit is poor, we may have to switch to a telephone visit.  With either a video or telephone visit, we are not always able to ensure that we have a secure connection.   I need to obtain your verbal consent now.   Are you willing to proceed with your visit today?   April [name] has provided verbal consent on 09/06/2020 for a virtual visit (video or telephone).   Elveria Royals, CMA 09/06/2020  9:00 AM

## 2020-09-06 NOTE — Progress Notes (Signed)
This service is provided via telemedicine  No vital signs collected/recorded due to the encounter was a telemedicine visit.   Location of patient (ex: home, work):  Home  Patient consents to a telephone visit:  Yes, see encounter dated 09/07/2020  Location of the provider (ex: office, home):  [facility]  Name of any referring provider:  N/A  Names of all persons participating in the telemedicine service and their role in the encounter:  Abbey Chatters, Nurse Practitioner, Elveria Royals, CMA, and patient.   Time spent on call:  8 minutes with medical assistant

## 2020-09-06 NOTE — Progress Notes (Signed)
Careteam: Patient Care Team: [name], NP as PCP - General (Geriatric Medicine) [name], DO as Consulting Physician (Geriatric Medicine) [name], MD as Consulting Physician (Hematology) Jethro Bolus, MD as Consulting Physician (Ophthalmology) Sharyn Lull, Elvin So, MD as Referring Physician (Dermatology)  Advanced Directive information    No Known Allergies  Chief Complaint  Patient presents with   Acute Visit    Patient tested positive for COVID on 08/292022. Symptoms started on 09/04/2020 and she would like to know about antiviral medication.Symptoms include headache, cough runny nose, mild fever, some chest and head congestion. Taste has changed. No known COVID exposure. Patient has been vaccinated and one booster.     HPI: Patient is a 65 y.o. female for positive COVID.  Head cold is the worse part of it. Took tylenol and ibuprofen which helped the headache. Feels much better today then yesterday.  Had chest congestion yesterday but this has improved.  Low grade fever yesterday, sweaty and cold. Today does not feel feverish Took mucinex yesterday and drank a lot of water.   Review of Systems:  Review of Systems  Constitutional:  Positive for malaise/fatigue. Negative for chills, fever and weight loss.  HENT:  Positive for congestion. Negative for sore throat.   Respiratory:  Negative for cough and shortness of breath.   Cardiovascular:  Negative for chest pain.  Neurological:  Positive for headaches. Negative for dizziness.   Past Medical History:  Diagnosis Date   Anemia    Chronic back pain    Hypothyroidism    Past Surgical History:  Procedure Laterality Date   CESAREAN SECTION  2956,2130   COLONOSCOPY N/A 08/07/2013   Procedure: COLONOSCOPY;  Surgeon: West Bali, MD;  [facility]: [facility];  Service: Endoscopy;  Laterality: N/A;  10:30-moved to 930 [address] Ann notified pt   ESOPHAGOGASTRODUODENOSCOPY      ESOPHAGOGASTRODUODENOSCOPY N/A 08/07/2013   Procedure: ESOPHAGOGASTRODUODENOSCOPY (EGD);  Surgeon: West Bali, MD;  Location: [facility];  Service: Endoscopy;  Laterality: N/A;   GASTRIC BYPASS  2004   [place], [place]   TONSILLECTOMY     Childhood   UPPER GI ENDOSCOPY  2004   Dr.Borhanian    Social History:   reports that she has been smoking cigarettes. She has a 5.00 pack-year smoking history. She has never used smokeless tobacco. She reports that she does not drink alcohol and does not use drugs.  Family History  Problem Relation Age of Onset   Heart disease Mother    Diabetes Mother    Stroke Mother    Hypertension Mother    Diabetes Father    Heart disease Father    Hypertension Father    Migraines Daughter    Colon cancer Neg Hx     Medications: Patient's Medications  New Prescriptions   No medications on file  Previous Medications   ACETAMINOPHEN (TYLENOL) 500 MG TABLET    Take 500 mg by mouth every 6 (six) hours as needed for mild pain or headache.   CYANOCOBALAMIN (,VITAMIN B-12,) 1000 MCG/ML INJECTION    INJECT INTO THE MUSCLE EVERY 21 DAYS.   ESCITALOPRAM (LEXAPRO) 10 MG TABLET    TAKE ONE TABLET BY MOUTH ONCE DAILY.   ESTRADIOL (ESTRACE VAGINAL) 0.1 MG/GM VAGINAL CREAM    Place 1 Applicatorful vaginally 3 (three) times a week.   FOLIC ACID (FOLVITE) 1 MG TABLET    TAKE (1) TABLET BY MOUTH ONCE DAILY.   IBUPROFEN (ADVIL) 800  MG TABLET    Take by mouth as needed.   LEVOTHYROXINE (SYNTHROID) 50 MCG TABLET    TAKE ONE TABLET BY MOUTH ONCE DAILY.   TIZANIDINE (ZANAFLEX) 4 MG TABLET    Take 4 mg by mouth 2 (two) times daily as needed.   VITAMIN D, ERGOCALCIFEROL, (DRISDOL) 1.25 MG (50000 UNIT) CAPS CAPSULE    TAKE 1 CAPSULE BY MOUTH ONCE A WEEK.   ZONISAMIDE (ZONEGRAN) 100 MG CAPSULE    Take 200 mg by mouth daily.   Modified Medications   No medications on file  Discontinued Medications   No medications on file    Physical Exam:  There were no vitals  filed for this visit. There is no height or weight on file to calculate BMI. Wt Readings from Last 3 Encounters:  08/31/20 191 lb 6.4 oz (86.8 kg)  08/19/20 188 lb 12.8 oz (85.6 kg)  02/26/20 187 lb (84.8 kg)      Labs reviewed: Basic Metabolic Panel: Recent Labs    09/18/19 0959 02/19/20 0832 08/19/20 0945  NA 141 140 144  K 4.1 4.3 4.3  CL 108 108 113*  CO2 28 28 29   GLUCOSE 76 82 72  BUN 15 14 14   CREATININE 0.83 0.81 0.91  CALCIUM 8.4* 8.9 9.2  TSH 3.31  --  3.43   Liver Function Tests: Recent Labs    09/18/19 0959 02/19/20 0832 08/19/20 0945  AST 13 15 19   ALT 14 12 20   BILITOT 0.3 0.4 0.3  PROT 5.7* 5.6* 5.9*   No results for input(s): LIPASE, AMYLASE in the last 8760 hours. No results for input(s): AMMONIA in the last 8760 hours. CBC: Recent Labs    09/18/19 0959 02/19/20 0832 08/19/20 0945  WBC 4.1 4.2 4.5  NEUTROABS 2,362 2,381 2,943  HGB 12.0 11.9 12.6  HCT 36.3 35.2 37.7  MCV 100.6* 99.7 100.0  PLT 296 267 311   Lipid Panel: Recent Labs    09/18/19 0959 08/19/20 0945  CHOL 183 199  HDL 68 68  LDLCALC 98 109*  TRIG 83 108  CHOLHDL 2.7 2.9   TSH: Recent Labs    09/18/19 0959 08/19/20 0945  TSH 3.31 3.43   A1C: Lab Results  Component Value Date   HGBA1C 5.2 08/19/2020     Assessment/Plan 1. COVID-19 -recommended to take Vit C 1000 mg twice daily, Vit D 5000 units daily and zinc 50 mg daily  -maintain proper hydration -tylenol 650 mg by mouth every 8 hours as needed fever/body aches.  -mucinex twice daily as needed for cough and chest congestion -to call back if symptoms worsen or fail to improve  -quarantine recommended for 5 days or more if symptoms have not improvement. To wear mask for 5 additional days.  -education provided on when to follow up and when to seek immediate medication attention through the ED.     10/19/20. 11/18/19  [facility] [phone]    Virtual Visit via  Janene Harvey  I connected with patient on 09/06/20 at  9:00 AM EDT by video and verified that I am speaking with the correct person using two identifiers.  Location: Patient: home Provider: [facility]   I discussed the limitations, risks, security and privacy concerns of performing an evaluation and management service by telephone and the availability of in person appointments. I also discussed with the patient that there may be a patient responsible charge related to this service. The patient expressed understanding  and agreed to proceed.   I discussed the assessment and treatment plan with the patient. The patient was provided an opportunity to ask questions and all were answered. The patient agreed with the plan and demonstrated an understanding of the instructions.   The patient was advised to call back or seek an in-person evaluation if the symptoms worsen or if the condition fails to improve as anticipated.  I provided 15 minutes of non-face-to-face time during this encounter.  Janene Harvey. [employer]ag[employer] printed and mailed

## 2020-09-23 DIAGNOSIS — M542 Cervicalgia: Secondary | ICD-10-CM | POA: Diagnosis not present

## 2020-09-23 DIAGNOSIS — G43719 Chronic migraine without aura, intractable, without status migrainosus: Secondary | ICD-10-CM | POA: Diagnosis not present

## 2020-09-23 DIAGNOSIS — G43111 Migraine with aura, intractable, with status migrainosus: Secondary | ICD-10-CM | POA: Diagnosis not present

## 2020-10-11 ENCOUNTER — Encounter (HOSPITAL_COMMUNITY): Payer: Self-pay | Admitting: Hematology

## 2020-10-13 ENCOUNTER — Other Ambulatory Visit (HOSPITAL_COMMUNITY): Payer: Self-pay | Admitting: Hematology

## 2020-10-13 ENCOUNTER — Other Ambulatory Visit: Payer: Self-pay | Admitting: Nurse Practitioner

## 2020-10-13 NOTE — Telephone Encounter (Signed)
Patient has request refill for medications "Lexapro", and "Synthroid". Patient is due for refills. Medication has high risk warning. Medications pend and sent to PCP [name], NP . Please Advise.

## 2020-10-22 DIAGNOSIS — S20224A Contusion of middle back wall of thorax, initial encounter: Secondary | ICD-10-CM | POA: Diagnosis not present

## 2020-10-22 DIAGNOSIS — S30810A Abrasion of lower back and pelvis, initial encounter: Secondary | ICD-10-CM | POA: Diagnosis not present

## 2020-10-22 DIAGNOSIS — S82892A Other fracture of left lower leg, initial encounter for closed fracture: Secondary | ICD-10-CM

## 2020-10-22 DIAGNOSIS — S82832A Other fracture of upper and lower end of left fibula, initial encounter for closed fracture: Secondary | ICD-10-CM | POA: Diagnosis not present

## 2020-10-22 DIAGNOSIS — G43909 Migraine, unspecified, not intractable, without status migrainosus: Secondary | ICD-10-CM | POA: Diagnosis not present

## 2020-10-22 DIAGNOSIS — S90411A Abrasion, right great toe, initial encounter: Secondary | ICD-10-CM | POA: Diagnosis not present

## 2020-10-22 DIAGNOSIS — S20419A Abrasion of unspecified back wall of thorax, initial encounter: Secondary | ICD-10-CM | POA: Diagnosis not present

## 2020-10-22 DIAGNOSIS — M25572 Pain in left ankle and joints of left foot: Secondary | ICD-10-CM | POA: Diagnosis not present

## 2020-10-22 DIAGNOSIS — F17200 Nicotine dependence, unspecified, uncomplicated: Secondary | ICD-10-CM | POA: Diagnosis not present

## 2020-10-22 DIAGNOSIS — S90412A Abrasion, left great toe, initial encounter: Secondary | ICD-10-CM | POA: Diagnosis not present

## 2020-10-22 DIAGNOSIS — W109XXA Fall (on) (from) unspecified stairs and steps, initial encounter: Secondary | ICD-10-CM | POA: Diagnosis not present

## 2020-10-22 DIAGNOSIS — S9305XA Dislocation of left ankle joint, initial encounter: Secondary | ICD-10-CM | POA: Diagnosis not present

## 2020-10-22 HISTORY — DX: Other fracture of left lower leg, initial encounter for closed fracture: S82.892A

## 2020-10-23 HISTORY — PX: FIBULA FRACTURE SURGERY: SHX947

## 2020-10-24 DIAGNOSIS — S8292XA Unspecified fracture of left lower leg, initial encounter for closed fracture: Secondary | ICD-10-CM | POA: Diagnosis not present

## 2020-10-25 ENCOUNTER — Other Ambulatory Visit: Payer: Self-pay

## 2020-10-25 ENCOUNTER — Ambulatory Visit
Admission: RE | Admit: 2020-10-25 | Discharge: 2020-10-25 | Disposition: A | Discharge status: Home / Self Care | Payer: BC Managed Care – PPO | Source: Ambulatory Visit | Attending: Physician Assistant | Admitting: Physician Assistant

## 2020-10-25 ENCOUNTER — Other Ambulatory Visit: Payer: Self-pay | Admitting: Physician Assistant

## 2020-10-25 DIAGNOSIS — S92102A Unspecified fracture of left talus, initial encounter for closed fracture: Secondary | ICD-10-CM | POA: Diagnosis not present

## 2020-10-25 DIAGNOSIS — S82832A Other fracture of upper and lower end of left fibula, initial encounter for closed fracture: Secondary | ICD-10-CM | POA: Diagnosis not present

## 2020-10-25 DIAGNOSIS — M7981 Nontraumatic hematoma of soft tissue: Secondary | ICD-10-CM | POA: Diagnosis not present

## 2020-10-25 DIAGNOSIS — S82852A Displaced trimalleolar fracture of left lower leg, initial encounter for closed fracture: Secondary | ICD-10-CM | POA: Diagnosis not present

## 2020-10-25 DIAGNOSIS — M25572 Pain in left ankle and joints of left foot: Secondary | ICD-10-CM

## 2020-10-27 ENCOUNTER — Encounter (HOSPITAL_BASED_OUTPATIENT_CLINIC_OR_DEPARTMENT_OTHER): Payer: Self-pay | Admitting: Orthopaedic Surgery

## 2020-10-27 ENCOUNTER — Other Ambulatory Visit: Payer: Self-pay

## 2020-10-27 DIAGNOSIS — Z993 Dependence on wheelchair: Secondary | ICD-10-CM

## 2020-10-27 DIAGNOSIS — L989 Disorder of the skin and subcutaneous tissue, unspecified: Secondary | ICD-10-CM

## 2020-10-27 HISTORY — DX: Disorder of the skin and subcutaneous tissue, unspecified: L98.9

## 2020-10-27 HISTORY — DX: Dependence on wheelchair: Z99.3

## 2020-10-27 NOTE — Progress Notes (Signed)
Spoke w/ via phone for pre-op interview---pt Lab needs dos----    none per anesthesia, surgery orders pending           Lab results------none COVID test -----patient states asymptomatic no test needed Arrive at -------1050 am 11-02-2020 NPO after MN NO Solid Food.  Clear liquids from MN until---950 am Med rec completed Medications to take morning of surgery -----levothyroxine Diabetic medication -----n/[name] instructed no nail polish to be worn day of surgery Patient instructed to bring photo id and insurance card day of surgery Patient aware to have Driver (ride ) / caregiver    for 24 hours after surgery spouse joe  Patient Special Instructions -----none Pre-Op special Istructions -----surgery orders req dr Odis Hollingshead epic ib Patient verbalized understanding of instructions that were given at this phone interview. Patient denies shortness of breath, chest pain, fever, cough at this phone interview.   Pt using wheelchair due to left ankle fracture can transer self per pt  [facility] oncology 08-31-2020  epic dr Zipporah Plants , last iron transfusion over a year ago per pt

## 2020-10-28 DIAGNOSIS — F4323 Adjustment disorder with mixed anxiety and depressed mood: Secondary | ICD-10-CM | POA: Diagnosis not present

## 2020-11-01 ENCOUNTER — Encounter (HOSPITAL_COMMUNITY): Payer: Self-pay | Admitting: Orthopaedic Surgery

## 2020-11-01 NOTE — Discharge Instructions (Addendum)
April Cedars, MD [facility]  Please read the following information regarding your care after surgery.  Please stop smoking as this will hurt your healing after surgery/injury.  Medications  If prescribed, you only need a prescription for the narcotic pain medicine (ex. tramadol).  All of the other medicines listed below are available over the counter. ? Aleve 2 pills twice a day for the first 3 days after surgery. ? acetominophen (Tylenol) 650 mg every 4-6 hours as you need for minor to moderate pain ? tramadol if prescribed for severe pain  ? To help prevent blood clots, take Xarelto 10 mg daily for 14 days after surgery. Start taking Xarelto the day after surgery. After finishing Xarelto course, start taking aspirin 325 mg daily for another 14 days.  Starting from day of surgery, you should also get up every hour while you are awake to move around.  Weight Bearing ? Do not bear any weight on the operated leg or foot.  Cast / Splint / Dressing ? If you have a splint, do NOT remove this. Keep your splint, cast or dressing clean and dry.  Don't put anything (coat hanger, pencil, etc) down inside of it.  If it gets damp, use a hair dryer on the cool setting to dry it.  If it gets soaked, call the office to schedule an appointment for a cast change.   Swelling IMPORTANT: It is normal for you to have swelling where you had surgery.  To reduce swelling and pain, keep your toes above your nose for at least 3 days after surgery.  It may be necessary to keep your foot or leg elevated for several weeks.  If it hurts, it should be elevated.  Follow Up Call my office at [phone] when you are discharged from the hospital or surgery center to schedule an appointment to be seen two weeks after surgery.  Call my office at [phone] if you develop a fever >101.5 F, nausea, vomiting, bleeding from the surgical site or severe pain.

## 2020-11-01 NOTE — Progress Notes (Signed)
April [name] denies chest pain or shortness of breath.  Patient denies having any s/s of Covid in her household.  Patient denies any known exposure to Covid.   I instructed patient to shower with antibiotic soap, if it is available.  Dry off with a clean towel. Do not put lotion, powder, cologne or deodorant or makeup.No jewelry or piercings. Men may shave their face and neck. Woman should not shave. No nail polish, artificial or acrylic nails. Wear clean clothes, brush your teeth. Glasses, contact lens,dentures or partials may not be worn in the OR. If you need to wear them, please bring a case for glasses, do not wear contacts or bring a case, the hospital does not have contact cases, dentures or partials will have to be removed , make sure they are clean, we will provide a denture cup to put them in. You will need some one to drive you home and a responsible person over the age of 80 to stay with you for the first 24 hours after surgery.

## 2020-11-02 ENCOUNTER — Observation Stay (HOSPITAL_COMMUNITY)
Admission: RE | Admit: 2020-11-02 | Discharge: 2020-11-03 | Disposition: A | Discharge status: Home / Self Care | Payer: BC Managed Care – PPO | Source: Ambulatory Visit | Attending: Orthopaedic Surgery | Admitting: Orthopaedic Surgery

## 2020-11-02 ENCOUNTER — Ambulatory Visit (HOSPITAL_COMMUNITY): Payer: BC Managed Care – PPO

## 2020-11-02 ENCOUNTER — Other Ambulatory Visit: Payer: Self-pay

## 2020-11-02 ENCOUNTER — Ambulatory Visit (HOSPITAL_COMMUNITY): Payer: BC Managed Care – PPO | Admitting: Anesthesiology

## 2020-11-02 ENCOUNTER — Encounter (HOSPITAL_COMMUNITY)
Admission: RE | Disposition: A | Discharge status: Home / Self Care | Payer: Self-pay | Source: Ambulatory Visit | Attending: Orthopaedic Surgery

## 2020-11-02 ENCOUNTER — Encounter (HOSPITAL_COMMUNITY): Payer: Self-pay | Admitting: Orthopaedic Surgery

## 2020-11-02 DIAGNOSIS — X58XXXA Exposure to other specified factors, initial encounter: Secondary | ICD-10-CM | POA: Diagnosis not present

## 2020-11-02 DIAGNOSIS — Z9889 Other specified postprocedural states: Secondary | ICD-10-CM

## 2020-11-02 DIAGNOSIS — D509 Iron deficiency anemia, unspecified: Secondary | ICD-10-CM | POA: Diagnosis not present

## 2020-11-02 DIAGNOSIS — S82852A Displaced trimalleolar fracture of left lower leg, initial encounter for closed fracture: Principal | ICD-10-CM | POA: Insufficient documentation

## 2020-11-02 DIAGNOSIS — S8292XD Unspecified fracture of left lower leg, subsequent encounter for closed fracture with routine healing: Secondary | ICD-10-CM | POA: Diagnosis not present

## 2020-11-02 DIAGNOSIS — G8918 Other acute postprocedural pain: Secondary | ICD-10-CM | POA: Diagnosis not present

## 2020-11-02 DIAGNOSIS — Z419 Encounter for procedure for purposes other than remedying health state, unspecified: Secondary | ICD-10-CM

## 2020-11-02 DIAGNOSIS — S93432A Sprain of tibiofibular ligament of left ankle, initial encounter: Secondary | ICD-10-CM | POA: Diagnosis not present

## 2020-11-02 DIAGNOSIS — E039 Hypothyroidism, unspecified: Secondary | ICD-10-CM | POA: Diagnosis not present

## 2020-11-02 HISTORY — DX: Anxiety disorder, unspecified: F41.9

## 2020-11-02 HISTORY — DX: Vitamin D deficiency, unspecified: E55.9

## 2020-11-02 HISTORY — DX: Headache, unspecified: R51.9

## 2020-11-02 HISTORY — PX: ORIF ANKLE FRACTURE: SHX5408

## 2020-11-02 HISTORY — DX: Deficiency of other specified B group vitamins: E53.8

## 2020-11-02 HISTORY — DX: Presence of spectacles and contact lenses: Z97.3

## 2020-11-02 LAB — CBC
HCT: 35.8 % — ABNORMAL LOW (ref 36.0–46.0)
Hemoglobin: 11.5 g/dL — ABNORMAL LOW (ref 12.0–15.0)
MCH: 33.2 pg (ref 26.0–34.0)
MCHC: 32.1 g/dL (ref 30.0–36.0)
MCV: 103.5 fL — ABNORMAL HIGH (ref 80.0–100.0)
Platelets: 421 10*3/uL — ABNORMAL HIGH (ref 150–400)
RBC: 3.46 MIL/uL — ABNORMAL LOW (ref 3.87–5.11)
RDW: 12.9 % (ref 11.5–15.5)
WBC: 5.8 10*3/uL (ref 4.0–10.5)
nRBC: 0 % (ref 0.0–0.2)

## 2020-11-02 SURGERY — OPEN REDUCTION INTERNAL FIXATION (ORIF) ANKLE FRACTURE
Anesthesia: Regional | Site: Ankle | Laterality: Left

## 2020-11-02 MED ORDER — FENTANYL CITRATE (PF) 100 MCG/2ML IJ SOLN
INTRAMUSCULAR | Status: AC
  Filled 2020-11-02: qty 2

## 2020-11-02 MED ORDER — LACTATED RINGERS IV SOLN: INTRAVENOUS | Status: DC

## 2020-11-02 MED ORDER — ONDANSETRON HCL 4 MG/2ML IJ SOLN
INTRAMUSCULAR | Status: DC | PRN
  Administered 2020-11-02: 4 mg via INTRAVENOUS

## 2020-11-02 MED ORDER — CEFAZOLIN SODIUM-DEXTROSE 2-4 GM/100ML-% IV SOLN
2.0000 g | Freq: Three times a day (TID) | INTRAVENOUS | Status: DC
  Administered 2020-11-02 – 2020-11-03 (×2): 2 g via INTRAVENOUS
  Filled 2020-11-02 (×4): qty 100

## 2020-11-02 MED ORDER — FENTANYL CITRATE (PF) 100 MCG/2ML IJ SOLN
25.0000 ug | INTRAMUSCULAR | Status: DC | PRN
  Administered 2020-11-02: 50 ug via INTRAVENOUS

## 2020-11-02 MED ORDER — ROPIVACAINE HCL 5 MG/ML IJ SOLN
INTRAMUSCULAR | Status: DC | PRN
  Administered 2020-11-02: 15 mL via PERINEURAL

## 2020-11-02 MED ORDER — ZONISAMIDE 100 MG PO CAPS
200.0000 mg | ORAL_CAPSULE | Freq: Every day | ORAL | Status: DC
  Administered 2020-11-02: 200 mg via ORAL
  Filled 2020-11-02 (×2): qty 2

## 2020-11-02 MED ORDER — MIDAZOLAM HCL 2 MG/2ML IJ SOLN: 2.0000 mg | Freq: Once | INTRAMUSCULAR | Status: AC

## 2020-11-02 MED ORDER — ACETAMINOPHEN 325 MG PO TABS: 325.0000 mg | ORAL_TABLET | Freq: Four times a day (QID) | ORAL | Status: DC | PRN

## 2020-11-02 MED ORDER — ORAL CARE MOUTH RINSE: 15.0000 mL | Freq: Once | OROMUCOSAL | Status: AC

## 2020-11-02 MED ORDER — DEXAMETHASONE SODIUM PHOSPHATE 10 MG/ML IJ SOLN
INTRAMUSCULAR | Status: DC | PRN
  Administered 2020-11-02: 5 mg

## 2020-11-02 MED ORDER — ESCITALOPRAM OXALATE 10 MG PO TABS
10.0000 mg | ORAL_TABLET | Freq: Every day | ORAL | Status: DC
  Administered 2020-11-02: 10 mg via ORAL
  Filled 2020-11-02: qty 1

## 2020-11-02 MED ORDER — VANCOMYCIN HCL 1000 MG IV SOLR
INTRAVENOUS | Status: DC | PRN
  Administered 2020-11-02: 1000 mg

## 2020-11-02 MED ORDER — TRAMADOL HCL 50 MG PO TABS
50.0000 mg | ORAL_TABLET | Freq: Four times a day (QID) | ORAL | Status: DC | PRN
Start: 2020-11-02 — End: 2020-11-03
  Administered 2020-11-03 (×2): 50 mg via ORAL
  Filled 2020-11-02 (×2): qty 1

## 2020-11-02 MED ORDER — BUPIVACAINE HCL (PF) 0.5 % IJ SOLN
INTRAMUSCULAR | Status: DC | PRN
  Administered 2020-11-02: 20 mL via PERINEURAL

## 2020-11-02 MED ORDER — ONDANSETRON HCL 4 MG/2ML IJ SOLN
INTRAMUSCULAR | Status: AC
  Filled 2020-11-02: qty 2

## 2020-11-02 MED ORDER — PROPOFOL 10 MG/ML IV BOLUS
INTRAVENOUS | Status: DC | PRN
  Administered 2020-11-02: 150 mg via INTRAVENOUS

## 2020-11-02 MED ORDER — FENTANYL CITRATE (PF) 250 MCG/5ML IJ SOLN
INTRAMUSCULAR | Status: AC
  Filled 2020-11-02: qty 5

## 2020-11-02 MED ORDER — ACETAMINOPHEN 500 MG PO TABS
500.0000 mg | ORAL_TABLET | Freq: Four times a day (QID) | ORAL | Status: DC
  Administered 2020-11-02 – 2020-11-03 (×3): 500 mg via ORAL
  Filled 2020-11-02 (×3): qty 1

## 2020-11-02 MED ORDER — LEVOTHYROXINE SODIUM 50 MCG PO TABS
50.0000 ug | ORAL_TABLET | Freq: Every day | ORAL | Status: DC
  Administered 2020-11-03: 50 ug via ORAL
  Filled 2020-11-02: qty 1

## 2020-11-02 MED ORDER — ONDANSETRON HCL 4 MG PO TABS: 4.0000 mg | ORAL_TABLET | Freq: Four times a day (QID) | ORAL | Status: DC | PRN

## 2020-11-02 MED ORDER — VANCOMYCIN HCL 1000 MG IV SOLR
INTRAVENOUS | Status: AC
  Filled 2020-11-02: qty 20

## 2020-11-02 MED ORDER — 0.9 % SODIUM CHLORIDE (POUR BTL) OPTIME
TOPICAL | Status: DC | PRN
  Administered 2020-11-02: 1000 mL

## 2020-11-02 MED ORDER — ONDANSETRON HCL 4 MG/2ML IJ SOLN: 4.0000 mg | Freq: Four times a day (QID) | INTRAMUSCULAR | Status: DC | PRN

## 2020-11-02 MED ORDER — BUPIVACAINE LIPOSOME 1.3 % IJ SUSP
INTRAMUSCULAR | Status: DC | PRN
  Administered 2020-11-02: 10 mL via PERINEURAL

## 2020-11-02 MED ORDER — MIDAZOLAM HCL 2 MG/2ML IJ SOLN
INTRAMUSCULAR | Status: AC
  Administered 2020-11-02: 2 mg via INTRAVENOUS
  Filled 2020-11-02: qty 2

## 2020-11-02 MED ORDER — CHLORHEXIDINE GLUCONATE 0.12 % MT SOLN
15.0000 mL | Freq: Once | OROMUCOSAL | Status: AC
  Administered 2020-11-02: 15 mL via OROMUCOSAL
  Filled 2020-11-02: qty 15

## 2020-11-02 MED ORDER — DEXAMETHASONE SODIUM PHOSPHATE 10 MG/ML IJ SOLN
INTRAMUSCULAR | Status: AC
  Filled 2020-11-02: qty 1

## 2020-11-02 MED ORDER — ACETAMINOPHEN 500 MG PO TABS
1000.0000 mg | ORAL_TABLET | Freq: Once | ORAL | Status: AC
  Administered 2020-11-02: 1000 mg via ORAL
  Filled 2020-11-02: qty 2

## 2020-11-02 MED ORDER — DEXAMETHASONE SODIUM PHOSPHATE 10 MG/ML IJ SOLN
INTRAMUSCULAR | Status: DC | PRN
  Administered 2020-11-02: 8 mg via INTRAVENOUS

## 2020-11-02 MED ORDER — CEFAZOLIN SODIUM-DEXTROSE 2-4 GM/100ML-% IV SOLN
2.0000 g | INTRAVENOUS | Status: AC
  Administered 2020-11-02: 2 g via INTRAVENOUS
  Filled 2020-11-02: qty 100

## 2020-11-02 SURGICAL SUPPLY — 58 items
ALCOHOL 70% 16 OZ (MISCELLANEOUS) ×2 IMPLANT
BAG COUNTER SPONGE SURGICOUNT (BAG) ×2 IMPLANT
BANDAGE ESMARK 6X9 LF (GAUZE/BANDAGES/DRESSINGS) ×1 IMPLANT
BIT DRILL 2.5X2.75 QC CALB (BIT) ×2 IMPLANT
BLADE SURG 15 STRL LF DISP TIS (BLADE) ×2 IMPLANT
BLADE SURG 15 STRL SS (BLADE) ×4
BNDG COHESIVE 4X5 TAN STRL (GAUZE/BANDAGES/DRESSINGS) ×2 IMPLANT
BNDG COHESIVE 6X5 TAN STRL LF (GAUZE/BANDAGES/DRESSINGS) ×2 IMPLANT
BNDG ESMARK 6X9 LF (GAUZE/BANDAGES/DRESSINGS) ×2
BNDG GAUZE ELAST 4 BULKY (GAUZE/BANDAGES/DRESSINGS) ×2 IMPLANT
CHLORAPREP W/TINT 26 (MISCELLANEOUS) ×4 IMPLANT
COVER SURGICAL LIGHT HANDLE (MISCELLANEOUS) ×2 IMPLANT
CUFF TOURN SGL QUICK 34 (TOURNIQUET CUFF) ×2
CUFF TRNQT CYL 34X4.125X (TOURNIQUET CUFF) ×1 IMPLANT
DRAPE U-SHAPE 47X51 STRL (DRAPES) ×2 IMPLANT
DRSG XEROFORM 1X8 (GAUZE/BANDAGES/DRESSINGS) ×2 IMPLANT
ELECT REM PT RETURN 9FT ADLT (ELECTROSURGICAL) ×2
ELECTRODE REM PT RTRN 9FT ADLT (ELECTROSURGICAL) ×1 IMPLANT
FIXATION ZIPTIGHT ANKLE SNDSMS (Ankle) ×1 IMPLANT
GAUZE SPONGE 4X4 12PLY STRL (GAUZE/BANDAGES/DRESSINGS) ×2 IMPLANT
GLOVE SRG 8 PF TXTR STRL LF DI (GLOVE) ×1 IMPLANT
GLOVE SURG ENC MOIS LTX SZ8 (GLOVE) ×2 IMPLANT
GLOVE SURG LTX SZ8 (GLOVE) ×2 IMPLANT
GLOVE SURG POLYISO LF SZ7.5 (GLOVE) ×2 IMPLANT
GLOVE SURG UNDER POLY LF SZ8 (GLOVE) ×2
GOWN STRL REUS W/ TWL LRG LVL3 (GOWN DISPOSABLE) ×1 IMPLANT
GOWN STRL REUS W/ TWL XL LVL3 (GOWN DISPOSABLE) ×2 IMPLANT
GOWN STRL REUS W/TWL LRG LVL3 (GOWN DISPOSABLE) ×2
GOWN STRL REUS W/TWL XL LVL3 (GOWN DISPOSABLE) ×4
K-WIRE ACE 1.6X6 (WIRE) ×4
KIT BASIN OR (CUSTOM PROCEDURE TRAY) ×2 IMPLANT
KIT TURNOVER KIT B (KITS) ×2 IMPLANT
KWIRE ACE 1.6X6 (WIRE) ×2 IMPLANT
NS IRRIG 1000ML POUR BTL (IV SOLUTION) ×2 IMPLANT
PACK ORTHO EXTREMITY (CUSTOM PROCEDURE TRAY) ×2 IMPLANT
PAD ARMBOARD 7.5X6 YLW CONV (MISCELLANEOUS) ×4 IMPLANT
PAD CAST 4YDX4 CTTN HI CHSV (CAST SUPPLIES) ×6 IMPLANT
PADDING CAST COTTON 4X4 STRL (CAST SUPPLIES) ×12
PLATE LOCK 4H 109 LT DIST FIB (Plate) ×2 IMPLANT
SCREW LOCK CORT STAR 3.5X10 (Screw) ×2 IMPLANT
SCREW LOCK CORT STAR 3.5X14 (Screw) ×4 IMPLANT
SCREW LOCK CORT STAR 3.5X16 (Screw) ×6 IMPLANT
SCREW NON LOCKING LP 3.5 14MM (Screw) ×4 IMPLANT
SCREW NON LOCKING LP 3.5 16MM (Screw) ×4 IMPLANT
SPLINT PLASTER CAST XFAST 5X30 (CAST SUPPLIES) ×2 IMPLANT
SPLINT PLASTER XFAST SET 5X30 (CAST SUPPLIES) ×2
SPONGE T-LAP 18X18 ~~LOC~~+RFID (SPONGE) ×4 IMPLANT
SUCTION FRAZIER HANDLE 10FR (MISCELLANEOUS) ×2
SUCTION TUBE FRAZIER 10FR DISP (MISCELLANEOUS) ×1 IMPLANT
SUT ETHILON 3 0 PS 1 (SUTURE) ×4 IMPLANT
SUT VIC AB 2-0 CT1 27 (SUTURE) ×4
SUT VIC AB 2-0 CT1 TAPERPNT 27 (SUTURE) ×2 IMPLANT
SUT VIC AB 3-0 SH 27 (SUTURE) ×2
SUT VIC AB 3-0 SH 27X BRD (SUTURE) ×1 IMPLANT
TOWEL GREEN STERILE (TOWEL DISPOSABLE) ×2 IMPLANT
TOWEL GREEN STERILE FF (TOWEL DISPOSABLE) ×2 IMPLANT
TUBE CONNECTING 12X1/4 (SUCTIONS) ×2 IMPLANT
ZIPTIGHT ANKLE SYNODESMOSS FIX (Ankle) ×2 IMPLANT

## 2020-11-02 NOTE — Anesthesia Procedure Notes (Signed)
Anesthesia Regional Block: Popliteal block   Pre-Anesthetic Checklist: , timeout performed,  Correct Patient, Correct Site, Correct Laterality,  Correct Procedure, Correct Position, site marked,  Risks and benefits discussed,  Surgical consent,  Pre-op evaluation,  At surgeon's request and post-op pain management  Laterality: Left  Prep: Maximum Sterile Barrier Precautions used, chloraprep       Needles:  Injection technique: Single-shot  Needle Type: Echogenic Stimulator Needle     Needle Length: 9cm  Needle Gauge: 22     Additional Needles:   Procedures:,,,, ultrasound used (permanent image in chart),,    Narrative:  Start time: 11/02/2020 4:53 PM End time: 11/02/2020 4:56 PM Injection made incrementally with aspirations every 5 mL.  Performed by: Personally  Anesthesiologist: Elmer Picker, MD  Additional Notes: Monitors applied. No increased pain on injection. No increased resistance to injection. Injection made in 5cc increments. Good needle visualization. Patient tolerated procedure well.

## 2020-11-02 NOTE — Brief Op Note (Addendum)
11/02/2020  7:42 PM  PATIENT:  April [name]  65 y.o. female  PRE-OPERATIVE DIAGNOSIS:  Closed trimalleolar fracture left ankle  POST-OPERATIVE DIAGNOSIS:  Closed trimalleolar fracture left ankle  PROCEDURE:  Procedure(s) with comments: OPEN REDUCTION INTERNAL FIXATION (ORIF) ANKLE FRACTURE AND SYNDESMOTIC FIXATION (Left) - 120 Open reduction internal fixation left distal fibula Open reduction internal fixation left ankle syndesmosis (Ziptight device)  SURGEON:  Surgeon(s) and Role:    * Netta Cedars, MD - Primary  PHYSICIAN ASSISTANT: None   ASSISTANTS: none   ANESTHESIA:   local and general  EBL:  Minimal   BLOOD ADMINISTERED:none  DRAINS: none   LOCAL MEDICATIONS USED:  NONE  SPECIMEN:  No Specimen  DISPOSITION OF SPECIMEN:  N/A  COUNTS:  YES  TOURNIQUET:   Total Tourniquet Time Documented: Thigh (Left) - 60 minutes Total: Thigh (Left) - 60 minutes   DICTATION: Note written in EPIC  PLAN OF CARE: Discharge to home after PACU  PATIENT DISPOSITION:  PACU - hemodynamically stable.   Delay start of Pharmacological VTE agent (>24hrs) due to surgical blood loss or risk of bleeding: yes

## 2020-11-02 NOTE — Anesthesia Procedure Notes (Signed)
Anesthesia Regional Block: Adductor canal block   Pre-Anesthetic Checklist: , timeout performed,  Correct Patient, Correct Site, Correct Laterality,  Correct Procedure, Correct Position, site marked,  Risks and benefits discussed,  Surgical consent,  Pre-op evaluation,  At surgeon's request and post-op pain management  Laterality: Left  Prep: Maximum Sterile Barrier Precautions used, chloraprep       Needles:  Injection technique: Single-shot  Needle Type: Echogenic Stimulator Needle     Needle Length: 9cm  Needle Gauge: 22     Additional Needles:   Procedures:,,,, ultrasound used (permanent image in chart),,    Narrative:  Start time: 11/02/2020 4:56 PM End time: 11/02/2020 4:59 PM Injection made incrementally with aspirations every 5 mL.  Performed by: Personally  Anesthesiologist: Elmer Picker, MD  Additional Notes: Monitors applied. No increased pain on injection. No increased resistance to injection. Injection made in 5cc increments. Good needle visualization. Patient tolerated procedure well.

## 2020-11-02 NOTE — Anesthesia Preprocedure Evaluation (Addendum)
Anesthesia Evaluation  Patient identified by MRN, date of birth, ID band Patient awake    Reviewed: Allergy & Precautions, NPO status , Patient's Chart, lab work & pertinent test results  Airway Mallampati: II  TM Distance: >3 FB Neck ROM: Full    Dental no notable dental hx. (+) Teeth Intact, Dental Advisory Given   Pulmonary Current Smoker and Patient abstained from smoking.,    Pulmonary exam normal breath sounds clear to auscultation       Cardiovascular negative cardio ROS Normal cardiovascular exam Rhythm:Regular Rate:Normal     Neuro/Psych  Headaches, PSYCHIATRIC DISORDERS Anxiety Depression    GI/Hepatic negative GI ROS, Neg liver ROS,   Endo/Other  diabetes, Type 2Hypothyroidism   Renal/GU negative Renal ROS  negative genitourinary   Musculoskeletal negative musculoskeletal ROS (+)   Abdominal   Peds  Hematology negative hematology ROS (+)   Anesthesia Other Findings   Reproductive/Obstetrics                            Anesthesia Physical Anesthesia Plan  ASA: 2  Anesthesia Plan: General and Regional   Post-op Pain Management:  Regional for Post-op pain   Induction: Intravenous  PONV Risk Score and Plan: 2 and Ondansetron, Dexamethasone and Midazolam  Airway Management Planned: LMA  Additional Equipment:   Intra-op Plan:   Post-operative Plan: Extubation in OR  Informed Consent: I have reviewed the patients History and Physical, chart, labs and discussed the procedure including the risks, benefits and alternatives for the proposed anesthesia with the patient or authorized representative who has indicated his/her understanding and acceptance.     Dental advisory given  Plan Discussed with: CRNA  Anesthesia Plan Comments:         Anesthesia Quick Evaluation

## 2020-11-02 NOTE — Transfer of Care (Signed)
Immediate Anesthesia Transfer of Care Note  Patient: April [name]  Procedure(s) Performed: OPEN REDUCTION INTERNAL FIXATION (ORIF) ANKLE FRACTURE AND SYNDESMOTIC FIXATION (Left: Ankle)  Patient Location: PACU  Anesthesia Type:General  Level of Consciousness: awake, alert  and oriented  Airway & Oxygen Therapy: Patient Spontanous Breathing  Post-op Assessment: Report given to RN  Post vital signs: Reviewed and stable  Last Vitals:  Vitals Value Taken Time  BP 134/79 11/02/20 1929  Temp    Pulse 66 11/02/20 1930  Resp 6 11/02/20 1930  SpO2 100 % 11/02/20 1930  Vitals shown include unvalidated device data.  Last Pain:  Vitals:   11/02/20 1326  TempSrc:   PainSc: 0-No pain         Complications: No notable events documented.

## 2020-11-02 NOTE — H&P (Addendum)
H&P Update:  -History and Physical Reviewed  -Patient has been re-examined  -No change in the plan of care  -The risks and benefits were presented and reviewed. The risks due to hardware failure/irritation, infection, stiffness, nerve/vessel/tendon injury, nonunion/malunion, wound healing issues, failure of this surgery, need for further surgery, thromboembolic events, amputation, death among others were discussed. The patient acknowledged the explanation, agreed to proceed with the plan and a consent was signed.  April [name]  ADDENDUM:  -The patient has past history of narcotic abuse and requested tramadol for postop pain rather than oxycodone. A script has been sent for 7 days to her pharmacy. -She also continues to smoke - we discussed elevated risk of complications especially wound healing issues and fracture union. -She will be on Xarelto 10 mg once daily starting POD1 to prevent VTE. She will take Xarelto for 14 days and then transition to Aspirin 325 mg once daily for another 14 days. This has been updated in discharge instructions and discussed with patient - including both the potential risks of bleeding and wound issues with Xarelto.

## 2020-11-02 NOTE — Anesthesia Procedure Notes (Signed)
Procedure Name: LMA Insertion Date/Time: 11/02/2020 5:47 PM Performed by: Lynnell Chad, CRNA Pre-anesthesia Checklist: Patient identified, Emergency Drugs available, Suction available and Patient being monitored Patient Re-evaluated:Patient Re-evaluated prior to induction Oxygen Delivery Method: Circle System Utilized Preoxygenation: Pre-oxygenation with 100% oxygen Induction Type: IV induction Ventilation: Mask ventilation without difficulty LMA: LMA inserted LMA Size: 4.0 Number of attempts: 1 Airway Equipment and Method: Bite block Placement Confirmation: positive ETCO2 Tube secured with: Tape Dental Injury: Teeth and Oropharynx as per pre-operative assessment

## 2020-11-03 ENCOUNTER — Encounter (HOSPITAL_COMMUNITY): Payer: Self-pay | Admitting: Orthopaedic Surgery

## 2020-11-03 DIAGNOSIS — S82852A Displaced trimalleolar fracture of left lower leg, initial encounter for closed fracture: Secondary | ICD-10-CM | POA: Diagnosis not present

## 2020-11-03 DIAGNOSIS — X58XXXA Exposure to other specified factors, initial encounter: Secondary | ICD-10-CM | POA: Diagnosis not present

## 2020-11-03 NOTE — Evaluation (Signed)
Physical Therapy Evaluation Patient Details Name: April [name] MRN: [id number] DOB: [birth year] Today's Date: 11/03/2020  History of Present Illness  Patient is a 65 y/o female admitted with L trimalleolar ankle fx from fall down stairs.  No s/p ORIF and syndesmotic fixation.  PMH positive for DM2, hypothyroid, and tobacco use.  Clinical Impression  Patient presents with decreased mobility due to procedure noted above.  She was maneuvering at home prior to surgery with a wheelchair.  She demonstrated ability to pivot to w/c with min A and sit to stand with ambulation to recliner from bathroom with minguard A.  She reports method for stairs that works for her from w/c.  Demonstrated reverse technique with walker, but she feels safe with her technique.  Discussed no follow up PT for now, but will need when MD clears for mobility and strengthening of the ankle.  PT will sign off as planned d/c today and help available at d/c.      Recommendations for follow up therapy are one component of a multi-disciplinary discharge planning process, led by the attending physician.  Recommendations may be updated based on patient status, additional functional criteria and insurance authorization.  Follow Up Recommendations No PT follow up    Assistance Recommended at Discharge Intermittent Supervision/Assistance  Functional Status Assessment Patient has had a recent decline in their functional status and demonstrates the ability to make significant improvements in function in a reasonable and predictable amount of time.  Equipment Recommendations  Rolling walker (2 wheels)    Recommendations for Other Services       Precautions / Restrictions Precautions Precautions: Fall Required Braces or Orthoses: Splint/Cast Splint/Cast: L LE Restrictions Weight Bearing Restrictions: Yes LLE Weight Bearing: Non weight bearing      Mobility  Bed Mobility Overal bed mobility: Modified Independent                   Transfers Overall transfer level: Needs assistance   Transfers: Squat Pivot Transfers;Sit to/from Stand Sit to Stand: Min guard     Squat pivot transfers: Min assist     General transfer comment: assist to w/c from bed then to toilet in bathroom    Ambulation/Gait Ambulation/Gait assistance: Min guard Gait Distance (Feet): 10 Feet Assistive device: Rolling walker (2 wheels) Gait Pattern/deviations: Step-to pattern;Decreased stride length     General Gait Details: used w/c to get to bathroom, used RW NWB on L with minguard to recliner.  Stairs            Wheelchair Mobility    Modified Rankin (Stroke Patients Only)       Balance Overall balance assessment: Needs assistance   Sitting balance-Leahy Scale: Good Sitting balance - Comments: seated on toilet to perform hygiene independent   Standing balance support: Bilateral upper extremity supported Standing balance-Leahy Scale: Poor Standing balance comment: UE support due to NWB L LE                             Pertinent Vitals/Pain Pain Assessment: Faces Faces Pain Scale: Hurts little more Pain Location: L foot with dependency Pain Descriptors / Indicators: Throbbing Pain Intervention(s): Monitored during session;Repositioned    Home Living Family/patient expects to be discharged to:: Private residence Living Arrangements: Spouse/significant other;Children Available Help at Discharge: Family;Available 24 hours/day Type of Home: House Home Access: Stairs to enter Entrance Stairs-Rails: None Entrance Stairs-Number of Steps: 3   Home Layout: Multi-level;Able to live  on main level with bedroom/bathroom Home Equipment: Crutches;Wheelchair - manual;Shower seat - built in Additional Comments: reports long bench tile seat in shower    Prior Function Prior Level of Function : Needs assist       Physical Assist : Mobility (physical);ADLs (physical) Mobility (physical):  Transfers;Stairs   Mobility Comments: assist for using w/c in the home since injury to get to bathroom, etc; crutches from top step to porch able to get to higher step standing from w/c, using crutches with assist       Hand Dominance        Extremity/Trunk Assessment   Upper Extremity Assessment Upper Extremity Assessment: Overall WFL for tasks assessed    Lower Extremity Assessment Lower Extremity Assessment: LLE deficits/detail LLE Deficits / Details: wiggles toes and lifts leg with difficulty due to heavy cast       Communication   Communication: No difficulties  Cognition Arousal/Alertness: Awake/alert Behavior During Therapy: WFL for tasks assessed/performed Overall Cognitive Status: Within Functional Limits for tasks assessed                                          General Comments General comments (skin integrity, edema, etc.): Educated on toe wiggles on L and ankle pumps on R throughout the day; discussed knee walker, reports MD gave script. educated on options for ease of manueverablity in the home; also discussed plans for PT later when able to move her ankle    Exercises     Assessment/Plan    PT Assessment Patient does not need any further PT services  PT Problem List         PT Treatment Interventions      PT Goals (Current goals can be found in the Care Plan section)  Acute Rehab PT Goals Patient Stated Goal: for home today PT Goal Formulation: All assessment and education complete, DC therapy    Frequency     Barriers to discharge        Co-evaluation               AM-PAC PT "6 Clicks" Mobility  Outcome Measure Help needed turning from your back to your side while in a flat bed without using bedrails?: None Help needed moving from lying on your back to sitting on the side of a flat bed without using bedrails?: None Help needed moving to and from a bed to a chair (including a wheelchair)?: A Little Help needed  standing up from a chair using your arms (e.g., wheelchair or bedside chair)?: A Little Help needed to walk in hospital room?: A Little Help needed climbing 3-5 steps with a railing? : A Lot 6 Click Score: 19    End of Session   Activity Tolerance: Patient tolerated treatment well Patient left: in chair;with call bell/phone within reach   PT Visit Diagnosis: Difficulty in walking, not elsewhere classified (R26.2);History of falling (Z91.81);Pain Pain - Right/Left: Left Pain - part of body: Ankle and joints of foot    Time: 2671-2458 PT Time Calculation (min) (ACUTE ONLY): 24 min   Charges:   PT Evaluation $PT Eval Low Complexity: 1 Low PT Treatments $Self Care/Home Management: 8-22        Sheran Lawless, PT Acute Rehabilitation Services [phone] Office:[phone] 11/03/2020   Elray Mcgregor 11/03/2020, 10:07 AM

## 2020-11-03 NOTE — Op Note (Addendum)
11/02/2020  10:46 PM   PATIENT: April [name]  65 y.o. female  MRN: [id number]   PRE-OPERATIVE DIAGNOSIS:   Closed trimalleolar fracture left ankle   POST-OPERATIVE DIAGNOSIS:   Closed trimalleolar fracture left ankle   PROCEDURE: 1) Open reduction internal fixation of the left distal fibula 2) Open reduction internal fixation of the left ankle syndesmosis 3) Closed treatment of the left medial malleolus fracture 4) Closed treatment of the left posterior malleolus fracture   SURGEON:  Netta Cedars, MD   ASSISTANT: None   ANESTHESIA: General, regional   EBL: Minimal   TOURNIQUET:    Total Tourniquet Time Documented: Thigh (Left) - 60 minutes Total: Thigh (Left) - 60 minutes    COMPLICATIONS: None apparent   DISPOSITION: Extubated, awake and stable to recovery.   INDICATION FOR PROCEDURE: The patient is a 65 year old female who fell on staircase on 10/22/20 sustaining a left trimalleolar ankle fracture. The patient has PMH notable for smoking, opioid abuse.   We discussed the diagnosis, alternative treatment options, risks and benefits of the above surgical intervention, as well as alternative non-operative treatments. All questions/concerns were addressed and the patient/family demonstrated appropriate understanding of the diagnosis, the procedure, the postoperative course, and overall prognosis. The patient wished to proceed with surgical intervention and signed an informed surgical consent as such, in each others presence prior to surgery.   PROCEDURE IN DETAIL: After preoperative consent was obtained and the correct operative site was identified, the patient was brought to the operating room supine on stretcher and transferred onto operating table.  General anesthesia was induced. Preoperative antibiotics were administered. Surgical timeout was taken. The patient was then positioned supine with an ipsilateral hip bump. The operative lower extremity  was prepped and draped in standard sterile fashion with a tourniquet around the thigh. The extremity was exsanguinated and the tourniquet was inflated to 250 mmHg.  A standard lateral incision was made over the distal fibula. Dissection was carried down to the level of the fibula while protecting the superficial peroneal nerve branches and the fracture site identified. The fracture was debrided and the edges defined to achieve cortical read. Reduction maneuver was performed using pointed reduction forceps and lobster forceps. In this manner, the fibula length was restored and fracture reduced. It was not possible to place a lag screw across the fracture site due to significant comminution and poor bone quality. We then selected an anatomic locking distal fibula plate and placed this directly over the fibula. This was implanted under intraoperative fluoroscopy with a combination of distal locking screws and proximal cortical screws.   A manual external rotation stress radiograph was obtained and demonstrated widening of the ankle mortise. Therefore a suture fixation system (Ziptight device) was implanted through the fibula plate in cannulated fashion to fix the syndesmosis. A repeat stress radiograph showed complete stability of the ankle mortise to testing.  Attention was then turned to the medial malleolus fracture. This was noted to be in anatomic alignment on intraoperative fluoroscopy. Due to extensive comminution of the posterior colliculus with intact anterior colliculus, this was managed by closed reduction without internal fixation. Similarly, the posterior malleolus fracture was noted to be too small in size to support screw fixation. Therefore, this fracture was also managed by closed reduction without internal fixation.   Final radiographs were taken with all hardware in place.  The surgical site was thoroughly irrigated. The tourniquet was deflated and hemostasis achieved. The deep layers were  closed using 2-0  vicryl and the subcutaneous tissues closed using 3-0 vicryl. The skin was closed without tension using 3-0 nylon suture.    The leg was cleaned with saline and sterile xeroform dressings with gauze were applied. A well padded bulky short leg splint was applied. The patient was awakened from anesthesia and transported to the recovery room in stable condition.    FOLLOW UP PLAN: -transfer to PACU, then RNF -IV Ancef x 24 hrs -strict NWB operative extremity, maximum elevation -maintain short leg splint until follow up -pain meds limited to tramadol at patient request in light of prior addiction history -DVT Ppx: Xarelto 10 mg once daily starting POD1 x 14 days followed by Aspirin 325 mg once daily for a further 14 days -physical therapy to assist with NWB training -discharge home POD1 pending pain control and physical therapy evaluation -sutures out in 2-3 weeks with exchange of short leg splint to short leg cast in outpatient office   RADIOGRAPHS: AP, lateral, oblique radiographs of the left ankle were obtained intraoperatively. These showed interval reduction and fixation of the fractures. All hardware is appropriately positioned and of the appropriate lengths. No other acute injuries are noted.   Netta Cedars Orthopaedic Surgery [facility]

## 2020-11-03 NOTE — Progress Notes (Signed)
Mobility Specialist Progress Note:   11/03/20 1400  Mobility  Activity Ambulated in room (Transfered to w/c)  Level of Assistance Contact guard assist, steadying assist  Assistive Device Front wheel walker;Wheelchair  LLE Weight Bearing NWB  Distance Ambulated (ft) 10 ft  Mobility Ambulated with assistance in room  Mobility Response Tolerated well  Mobility performed by Mobility specialist;Nurse  Bed Position  (w/c)  $Mobility charge 1 Mobility   Assisted RN in transferring to w/c for d/c. Pt to BR by hopping with RW before transferring to w/c.  Addison Lank Mobility Specialist  Phone [phone]

## 2020-11-03 NOTE — Progress Notes (Signed)
Subjective: 1 Day Post-Op Procedure(s) (LRB): OPEN REDUCTION INTERNAL FIXATION (ORIF) ANKLE FRACTURE AND SYNDESMOTIC FIXATION (Left)  Patient reports pain as mild.  Doing well now POD1. Elevating operative limb.   Objective:   VITALS:  Temp:  [97.7 F (36.5 C)-98.1 F (36.7 C)] 98 F (36.7 C) (10/27 1205) Pulse Rate:  [55-76] 69 (10/27 1205) Resp:  [6-29] 18 (10/27 1205) BP: (104-155)/(54-117) 124/69 (10/27 1205) SpO2:  [96 %-100 %] 100 % (10/27 1205) Weight:  [83 kg] 83 kg (10/26 1317)  Gen: AAOx3, NAD  Left lower extremity: Well padded short leg splint in place Wiggles toes SILT over toes CR<2s   LABS Recent Labs    11/02/20 1305  HGB 11.5*  WBC 5.8  PLT 421*   No results for input(s): NA, K, CL, CO2, BUN, CREATININE, GLUCOSE in the last 72 hours. No results for input(s): LABPT, INR in the last 72 hours.   Assessment/Plan: 1 Day Post-Op Procedure(s) (LRB): OPEN REDUCTION INTERNAL FIXATION (ORIF) ANKLE FRACTURE AND SYNDESMOTIC FIXATION (Left)  -doing well now POD1 s/p left ankle fracture ORIF -start Xarelto POD1 10mg  daily x 14 days followed by aspirin 325 mg daily x another 14 days -scripts sent to patient's preferred pharmacy through outside EMR -strict elevation LLE -maintain short leg splint -evaluated by PT -discharge home from [facility] 11/03/2020, 12:21 PM

## 2020-11-03 NOTE — Care Management (Signed)
PT recommending rolling walker, same ordered through [employer].

## 2020-11-03 NOTE — Anesthesia Postprocedure Evaluation (Signed)
Anesthesia Post Note  Patient: April [name]  Procedure(s) Performed: OPEN REDUCTION INTERNAL FIXATION (ORIF) ANKLE FRACTURE AND SYNDESMOTIC FIXATION (Left: Ankle)     Patient location during evaluation: PACU Anesthesia Type: Regional and General Level of consciousness: awake and alert Pain management: pain level controlled Vital Signs Assessment: post-procedure vital signs reviewed and stable Respiratory status: spontaneous breathing, nonlabored ventilation, respiratory function stable and patient connected to nasal cannula oxygen Cardiovascular status: blood pressure returned to baseline and stable Postop Assessment: no apparent nausea or vomiting Anesthetic complications: no   No notable events documented.  Last Vitals:  Vitals:   11/02/20 2056 11/03/20 0058  BP: (!) 120/57 108/67  Pulse: 67 60  Resp: 17 16  Temp: 36.6 C 36.7 C  SpO2: 100% 97%    Last Pain:  Vitals:   11/03/20 0143  TempSrc:   PainSc: Asleep                 Mayley Lish

## 2020-11-07 NOTE — Discharge Summary (Signed)
Physician Discharge Summary  Patient ID: April [name] MRN: [id number] DOB/AGE: [birth year] 65 y.o.  Admit date: 11/02/2020 Discharge date: 11/03/2020  Admission Diagnoses: Left trimalleolar ankle fracture  Discharge Diagnoses:  Active Problems:   Postoperative state   Discharged Condition: good  Hospital Course: Uncomplicated  The patient is a 65 year old female who fell on staircase on 10/22/20 sustaining a left trimalleolar ankle fracture. The patient has PMH notable for smoking, opioid abuse.    We discussed the diagnosis, alternative treatment options, risks and benefits of the above surgical intervention, as well as alternative non-operative treatments. All questions/concerns were addressed and the patient/family demonstrated appropriate understanding of the diagnosis, the procedure, the postoperative course, and overall prognosis. The patient wished to proceed with surgical intervention and signed an informed surgical consent as such, in each others presence prior to surgery.  She underwent left ankle ORIF on 11/02/20 without any complications. She was admitted for observation to ensure adequate pain control. The patient has history of narcotic abuse and it was agreed upon that the patient would use tramadol postop as outpatient. She also received nerve block by anesthesia. She discharged home POD1 after working with physical therapy. She had an uncomplicated admission.  Consults: None  Significant Diagnostic Studies: None  Treatments: surgery: Left ankle ORIF  Discharge Exam: Blood pressure 124/69, pulse 69, temperature 98 F (36.7 C), temperature source Oral, resp. rate 18, height 5\' 7"  (1.702 m), weight 83 kg, SpO2 100 %. Gen: AAOx3, NAD   Left lower extremity: Well padded short leg splint in place Wiggles toes SILT over toes CR<2s  Disposition: Discharge disposition: 01-Home or Self Care       Discharge Instructions     Discharge patient   Complete by:  As directed    Discharge disposition: 01-Home or Self Care   Discharge patient date: 11/03/2020      Allergies as of 11/03/2020   No Known Allergies      Medication List     TAKE these medications    acetaminophen 500 MG tablet Commonly known as: TYLENOL Take 500 mg by mouth every 6 (six) hours as needed for mild pain or headache.   aspirin 325 MG tablet Take 325 mg by mouth every evening.   cyanocobalamin 1000 MCG/ML injection Commonly known as: (VITAMIN B-12) Inject 1 mL (1,000 mcg total) into the skin every 30 (thirty) days.   escitalopram 10 MG tablet Commonly known as: LEXAPRO TAKE ONE TABLET BY MOUTH ONCE DAILY. What changed: when to take this   folic acid 1 MG tablet Commonly known as: FOLVITE TAKE (1) TABLET BY MOUTH ONCE DAILY. What changed: See the new instructions.   HYDROcodone-acetaminophen 7.5-325 MG tablet Commonly known as: NORCO Take 1 tablet by mouth every 6 (six) hours as needed for severe pain.   ibuprofen 600 MG tablet Commonly known as: ADVIL Take 600 mg by mouth every 6 (six) hours as needed (pain,.).   levothyroxine 50 MCG tablet Commonly known as: SYNTHROID TAKE ONE TABLET BY MOUTH ONCE DAILY.   tiZANidine 4 MG tablet Commonly known as: ZANAFLEX Take 4 mg by mouth 2 (two) times daily as needed (migraine headaches.).   Vitamin D (Ergocalciferol) 1.25 MG (50000 UNIT) Caps capsule Commonly known as: DRISDOL TAKE 1 CAPSULE BY MOUTH ONCE A WEEK. What changed:  when to take this additional instructions   zonisamide 100 MG capsule Commonly known as: ZONEGRAN Take 200 mg by mouth at bedtime.        Follow-up  Information     [facility] Follow up.   Why: Company walker was ordered through [employer] information: [address] [place] [address] [phone]                 Signed: Netta Cedars 11/07/2020, 7:56 PM

## 2020-11-08 DIAGNOSIS — S82852A Displaced trimalleolar fracture of left lower leg, initial encounter for closed fracture: Secondary | ICD-10-CM | POA: Diagnosis not present

## 2020-11-17 DIAGNOSIS — S82852A Displaced trimalleolar fracture of left lower leg, initial encounter for closed fracture: Secondary | ICD-10-CM | POA: Diagnosis not present

## 2020-11-25 DIAGNOSIS — F4323 Adjustment disorder with mixed anxiety and depressed mood: Secondary | ICD-10-CM | POA: Diagnosis not present

## 2021-01-07 ENCOUNTER — Encounter (HOSPITAL_COMMUNITY): Payer: Self-pay | Admitting: Hematology

## 2021-01-07 ENCOUNTER — Ambulatory Visit (HOSPITAL_COMMUNITY)
Admission: EM | Admit: 2021-01-07 | Discharge: 2021-01-07 | Disposition: A | Discharge status: Home / Self Care | Payer: Medicare Other | Attending: Emergency Medicine | Admitting: Emergency Medicine

## 2021-01-07 ENCOUNTER — Other Ambulatory Visit: Payer: Self-pay

## 2021-01-07 ENCOUNTER — Encounter (HOSPITAL_COMMUNITY): Payer: Self-pay | Admitting: Emergency Medicine

## 2021-01-07 ENCOUNTER — Ambulatory Visit (INDEPENDENT_AMBULATORY_CARE_PROVIDER_SITE_OTHER): Payer: Medicare Other

## 2021-01-07 DIAGNOSIS — S92015A Nondisplaced fracture of body of left calcaneus, initial encounter for closed fracture: Secondary | ICD-10-CM

## 2021-01-07 MED ORDER — HYDROCODONE-ACETAMINOPHEN 5-325 MG PO TABS: 1.0000 | ORAL_TABLET | ORAL | 0 refills | Status: AC | PRN

## 2021-01-07 NOTE — Discharge Instructions (Signed)
Your x-ray today showed a fracture ( break in bone) your heel   Continue to wear your boot. .This is used to protect your injury and prevent further damage. Leave in place until seen by orthopedic specialist. Do not put objects into splint to scratch skin. If numbness or tingling occurs after placement please return to Urgent Care for evaluation.   Notify your  orthopedic specialist when the office reopens.

## 2021-01-07 NOTE — ED Triage Notes (Signed)
History of fracture to fibula and malleolus.  Surgery was 10/26.  This morning slipped and felt a pop.  Patient is having pain in back of ankle.  Flexion of left foot is extremely painful in back of ankle.  Patient currently wearing a boot and in wheelchair

## 2021-01-07 NOTE — ED Provider Notes (Signed)
[facility]    CSN: [id number] Arrival date & time: 01/07/21  1017      History   Chief Complaint Chief Complaint  Patient presents with   Ankle Pain    HPI April [name] is a 65 y.o. female.   Patient presents with left heel pain beginning today after fall.  Endorses that she was getting out of the shower when she slipped and landed directly on her heel.  Pain started shortly after.  Pain is felt with flexion, able to bear weight.  Currently wearing Ortho boot due to surgical repair of fibula and malleolus on 10/26.  Healing appropriately.  Denies numbness, tingling.  Swelling has been present since initial fracture in October, has not worsened  Past Medical History:  Diagnosis Date   Anemia    Anxiety    Chronic back pain    Closed left ankle fracture 10/22/2020   in wheelchair   Dependence on wheelchair 10/27/2020   due to left ankle fracture 10-22-2020   History of COVID-19 09/05/2020   chest congestion, low grade fever all symptoms resolved per pt   Hypothyroidism    migraine    Skin abnormality 10/27/2020   scab on left knee and left toes healing, using splint on left leg   Vitamin B 12 deficiency    Vitamin D deficiency    Wears glasses     Patient Active Problem List   Diagnosis Date Noted   Postoperative state 11/02/2020   Major depression, single episode, in complete remission ([place]) 02/07/2018   Intractable migraine without status migrainosus 02/07/2018   Type 2 diabetes mellitus without complication, without long-term current use of insulin ([place]) 11/02/2016   Hyperlipidemia 11/02/2016   Hypothyroidism due to acquired atrophy of thyroid 11/02/2016   Iron deficiency anemia 09/14/2016   Malabsorption of iron 09/10/2013   H/O gastric bypass 09/10/2013   Postoperative malabsorption 07/29/2013   Encounter for screening colonoscopy 07/29/2013   Anemia 07/29/2013    Past Surgical History:  Procedure Laterality Date   CESAREAN SECTION   [id number]   COLONOSCOPY N/A 08/07/2013   Procedure: COLONOSCOPY;  Surgeon: Danie Binder, MD;  [facility];  Service: Endoscopy;  Laterality: N/A;  10:30-moved to [address] notified pt   ESOPHAGOGASTRODUODENOSCOPY     ESOPHAGOGASTRODUODENOSCOPY N/A 08/07/2013   Procedure: ESOPHAGOGASTRODUODENOSCOPY (EGD);  Surgeon: Danie Binder, MD;  [facility];  Service: Endoscopy;  Laterality: N/A;   GASTRIC BYPASS  2004   [place], [place]   ORIF ANKLE FRACTURE Left 11/02/2020   Procedure: OPEN REDUCTION INTERNAL FIXATION (ORIF) ANKLE FRACTURE AND SYNDESMOTIC FIXATION;  Surgeon: Armond Hang, MD;  Location: [place];  Service: Orthopedics;  Laterality: Left;  120   TONSILLECTOMY     Childhood   UPPER GI ENDOSCOPY  2004   Dr.Borhanian     OB History   No obstetric history on file.      Home Medications    Prior to Admission medications   Medication Sig Start Date End Date Taking? Authorizing Provider  HYDROcodone-acetaminophen (NORCO/VICODIN) 5-325 MG tablet Take 1 tablet by mouth every 4 (four) hours as needed for up to 3 days. 01/07/21 01/10/21 Yes Ronen Bromwell R, NP  naproxen sodium (ALEVE) 220 MG tablet Take 220 mg by mouth.   Yes [provider]  acetaminophen (TYLENOL) 500 MG tablet Take 500 mg by mouth every 6 (six) hours as needed for mild pain or headache.    [provider]  aspirin 325 MG tablet Take 325 mg by mouth every evening.    [provider]  cyanocobalamin (,VITAMIN B-12,) 1000 MCG/ML injection Inject 1 mL (1,000 mcg total) into the skin every 30 (thirty) days. 10/13/20   Derek Jack, MD  escitalopram (LEXAPRO) 10 MG tablet TAKE ONE TABLET BY MOUTH ONCE DAILY. Patient taking differently: Take 10 mg by mouth at bedtime. 10/13/20   Lauree Chandler, NP  folic acid (FOLVITE) 1 MG tablet TAKE (1) TABLET BY MOUTH ONCE DAILY. Patient taking differently: Take 1 mg by mouth every evening. 09/23/19   Derek Jack,  MD  ibuprofen (ADVIL) 600 MG tablet Take 600 mg by mouth every 6 (six) hours as needed (pain,.). 10/23/20   [provider]  levothyroxine (SYNTHROID) 50 MCG tablet TAKE ONE TABLET BY MOUTH ONCE DAILY. 10/13/20   Lauree Chandler, NP  tiZANidine (ZANAFLEX) 4 MG tablet Take 4 mg by mouth 2 (two) times daily as needed (migraine headaches.). 04/02/19   [provider]  Vitamin D, Ergocalciferol, (DRISDOL) 1.25 MG (50000 UNIT) CAPS capsule TAKE 1 CAPSULE BY MOUTH ONCE A WEEK. Patient taking differently: Take 50,000 Units by mouth every Wednesday. At bedtime 10/13/20   Derek Jack, MD  zonisamide (ZONEGRAN) 100 MG capsule Take 200 mg by mouth at bedtime. 06/12/17   [provider]    Family History Family History  Problem Relation Age of Onset   Heart disease Mother    Diabetes Mother    Stroke Mother    Hypertension Mother    Diabetes Father    Heart disease Father    Hypertension Father    Migraines Daughter    Colon cancer Neg Hx     Social History Social History   Tobacco Use   Smoking status: Every Day    Packs/day: 0.50    Years: 43.00    Pack years: 21.50    Types: Cigarettes   Smokeless tobacco: Never   Tobacco comments:    Relasped  Vaping Use   Vaping Use: Never used  Substance Use Topics   Alcohol use: No    Alcohol/week: 0.0 standard drinks   Drug use: Not Currently    Comment: hx of  prescrition narcotic dependence sober date jan 21st 2014     Allergies   Patient has no known allergies.   Review of Systems Review of Systems  Constitutional: Negative.   Respiratory: Negative.    Cardiovascular: Negative.   Musculoskeletal:  Positive for arthralgias and gait problem. Negative for back pain, joint swelling, myalgias, neck pain and neck stiffness.  Skin: Negative.     Physical Exam Triage Vital Signs ED Triage Vitals  Enc Vitals Group     BP 01/07/21 1059 (!) 142/87     Pulse Rate 01/07/21 1059 68     Resp 01/07/21  1059 20     Temp 01/07/21 1059 98 F (36.7 C)     Temp Source 01/07/21 1059 Oral     SpO2 01/07/21 1059 98 %     Weight --      Height --      Head Circumference --      Peak Flow --      Pain Score 01/07/21 1055 8     Pain Loc --      Pain Edu? --      Excl. in [place]? --    No data found.  Updated Vital Signs BP (!) 142/87 (BP Location: Right Arm)  Pulse 68    Temp 98 F (36.7 C) (Oral)    Resp 20    SpO2 98%   Visual Acuity Right Eye Distance:   Left Eye Distance:   Bilateral Distance:    Right Eye Near:   Left Eye Near:    Bilateral Near:     Physical Exam Constitutional:      Appearance: Normal appearance. She is normal weight.  HENT:     Head: Normocephalic.  Eyes:     Extraocular Movements: Extraocular movements intact.  Pulmonary:     Effort: Pulmonary effort is normal.  Musculoskeletal:     Comments: Point tenderness over left heel, no Achilles tendon tenderness, range of motion intact but pain elicited with flexion, moderate swelling present nonpitting, 2+ dorsalis pedis pulse, sensation intact  Neurological:     Mental Status: She is alert and oriented to person, place, and time. Mental status is at baseline.  Psychiatric:        Mood and Affect: Mood normal.     UC Treatments / Results  Labs (all labs ordered are listed, but only abnormal results are displayed) Labs Reviewed - No data to display  EKG   Radiology DG Ankle Complete Left  Result Date: 01/07/2021 CLINICAL DATA:  Fall in the shower this morning EXAM: LEFT ANKLE COMPLETE - 3+ VIEW COMPARISON:  11/02/2020 FINDINGS: Distal fibular plate and syndesmotic anchor. Medial malleolus and distal fibular fractures appear healed. Marked disuse osteopenia. Avulsion type fracture lucency the to the posterior calcaneus at the level of the Achilles attachment. IMPRESSION: 1. Calcaneal avulsion fracture the Achilles attachment. 2. No complicating features of recent ankle fracture repair with interval  healing. 3. Prominent disuse osteopenia. Electronically Signed   By: Tiburcio Pea M.D.   On: 01/07/2021 11:44    Procedures Procedures (including critical care time)  Medications Ordered in UC Medications - No data to display  Initial Impression / Assessment and Plan / UC Course  I have reviewed the triage vital signs and the nursing notes.  Pertinent labs & imaging results that were available during my care of the patient were reviewed by me and considered in my medical decision making (see chart for details).  Closed nondisplaced fracture of body of left calcaneus, initial encounter  Fracture confirmed on x-ray, patient is already wearing Ortho boot and in wheelchair, patient to notify orthopedic doctor after holiday when office reopens May continue use of the counter pain medicine every 4 hours, 3-day limit, PDMP reviewed, low risk Final Clinical Impressions(s) / UC Diagnoses   Final diagnoses:  Closed nondisplaced fracture of body of left calcaneus, initial encounter     Discharge Instructions      Your x-ray today showed a fracture ( break in bone) your heel   Continue to wear your boot. .This is used to protect your injury and prevent further damage. Leave in place until seen by orthopedic specialist. Do not put objects into splint to scratch skin. If numbness or tingling occurs after placement please return to Urgent Care for evaluation.   Notify your  orthopedic specialist when the office reopens.       ED Prescriptions     Medication Sig Dispense Auth. Provider   HYDROcodone-acetaminophen (NORCO/VICODIN) 5-325 MG tablet Take 1 tablet by mouth every 4 (four) hours as needed for up to 3 days. 10 tablet Valinda Hoar, NP      I have reviewed the PDMP during this encounter.   Valinda Hoar, NP  01/07/21 1305 ° °

## 2021-01-10 ENCOUNTER — Telehealth: Payer: Self-pay

## 2021-01-10 NOTE — Telephone Encounter (Signed)
Noted  

## 2021-01-10 NOTE — Telephone Encounter (Signed)
Patient states she recently fractured her ankle and had surgery. It was suggested by her orthopedic to get a bone density. To Abbey Chatters, NP to order.

## 2021-01-10 NOTE — Telephone Encounter (Signed)
Agree, she also needs an in office visit for welcome to medicare AWV.  Sent to taylor to schedule, in office

## 2021-01-11 ENCOUNTER — Other Ambulatory Visit (HOSPITAL_COMMUNITY): Payer: Self-pay | Admitting: Hematology

## 2021-01-12 DIAGNOSIS — S82852A Displaced trimalleolar fracture of left lower leg, initial encounter for closed fracture: Secondary | ICD-10-CM | POA: Diagnosis not present

## 2021-01-12 DIAGNOSIS — S92002A Unspecified fracture of left calcaneus, initial encounter for closed fracture: Secondary | ICD-10-CM | POA: Diagnosis not present

## 2021-01-12 DIAGNOSIS — F17210 Nicotine dependence, cigarettes, uncomplicated: Secondary | ICD-10-CM | POA: Diagnosis not present

## 2021-01-13 DIAGNOSIS — G518 Other disorders of facial nerve: Secondary | ICD-10-CM | POA: Diagnosis not present

## 2021-01-13 DIAGNOSIS — M542 Cervicalgia: Secondary | ICD-10-CM | POA: Diagnosis not present

## 2021-01-13 DIAGNOSIS — M791 Myalgia, unspecified site: Secondary | ICD-10-CM | POA: Diagnosis not present

## 2021-01-13 DIAGNOSIS — G43719 Chronic migraine without aura, intractable, without status migrainosus: Secondary | ICD-10-CM | POA: Diagnosis not present

## 2021-01-13 DIAGNOSIS — G43111 Migraine with aura, intractable, with status migrainosus: Secondary | ICD-10-CM | POA: Diagnosis not present

## 2021-01-14 DIAGNOSIS — M25572 Pain in left ankle and joints of left foot: Secondary | ICD-10-CM | POA: Diagnosis not present

## 2021-01-24 ENCOUNTER — Encounter (HOSPITAL_COMMUNITY): Payer: Self-pay | Admitting: Hematology

## 2021-01-27 DIAGNOSIS — S82852A Displaced trimalleolar fracture of left lower leg, initial encounter for closed fracture: Secondary | ICD-10-CM | POA: Diagnosis not present

## 2021-02-01 ENCOUNTER — Encounter (HOSPITAL_COMMUNITY): Payer: Self-pay | Admitting: Hematology

## 2021-02-03 ENCOUNTER — Other Ambulatory Visit: Payer: Self-pay

## 2021-02-03 ENCOUNTER — Ambulatory Visit (INDEPENDENT_AMBULATORY_CARE_PROVIDER_SITE_OTHER): Payer: Medicare Other | Admitting: Nurse Practitioner

## 2021-02-03 ENCOUNTER — Encounter: Payer: Self-pay | Admitting: Nurse Practitioner

## 2021-02-03 VITALS — BP 130/78 | HR 69 | Temp 97.3°F | Ht 68.0 in | Wt 185.6 lb

## 2021-02-03 DIAGNOSIS — E2839 Other primary ovarian failure: Secondary | ICD-10-CM | POA: Diagnosis not present

## 2021-02-03 DIAGNOSIS — Z0000 Encounter for general adult medical examination without abnormal findings: Secondary | ICD-10-CM

## 2021-02-03 DIAGNOSIS — Z23 Encounter for immunization: Secondary | ICD-10-CM | POA: Diagnosis not present

## 2021-02-03 NOTE — Progress Notes (Signed)
Subjective:    April [name] is a 66 y.o. female who presents for a Welcome to Medicare exam.   Review of Systems  Cardiac Risk Factors include: advanced age (>77men, >37 women);diabetes mellitus;hypertension;smoking/ tobacco exposure      Objective:    Today's Vitals   02/03/21 1553 02/03/21 1628  BP: 130/78   Pulse: 69   Temp: (!) 97.3 F (36.3 C)   TempSrc: Temporal   SpO2: 97%   Weight: 185 lb 9.6 oz (84.2 kg)   Height: 5\' 8"  (1.727 m)   PainSc:  1   Body mass index is 28.22 kg/m.  Medications Outpatient Encounter Medications as of 02/03/2021  Medication Sig   acetaminophen (TYLENOL) 500 MG tablet Take 500 mg by mouth every 6 (six) hours as needed for mild pain or headache.   cyanocobalamin (,VITAMIN B-12,) 1000 MCG/ML injection Inject 1 mL (1,000 mcg total) into the skin every 30 (thirty) days.   escitalopram (LEXAPRO) 10 MG tablet TAKE ONE TABLET BY MOUTH ONCE DAILY.   folic acid (FOLVITE) 1 MG tablet TAKE (1) TABLET BY MOUTH ONCE DAILY.   levothyroxine (SYNTHROID) 50 MCG tablet TAKE ONE TABLET BY MOUTH ONCE DAILY.   naproxen sodium (ALEVE) 220 MG tablet Take 220 mg by mouth.   tiZANidine (ZANAFLEX) 4 MG tablet Take 4 mg by mouth 2 (two) times daily as needed (migraine headaches.).   Vitamin D, Ergocalciferol, (DRISDOL) 1.25 MG (50000 UNIT) CAPS capsule TAKE 1 CAPSULE BY MOUTH ONCE A WEEK. (Patient taking differently: Take 50,000 Units by mouth every Wednesday. At bedtime)   zonisamide (ZONEGRAN) 100 MG capsule Take 200 mg by mouth at bedtime.   [DISCONTINUED] aspirin 325 MG tablet Take 325 mg by mouth every evening. (Patient not taking: Reported on 02/03/2021)   [DISCONTINUED] ibuprofen (ADVIL) 600 MG tablet Take 600 mg by mouth every 6 (six) hours as needed (pain,.). (Patient not taking: Reported on 02/03/2021)   No facility-administered encounter medications on file as of 02/03/2021.     History: Past Medical History:  Diagnosis Date   Anemia    Anxiety     Chronic back pain    Closed left ankle fracture 10/22/2020   in wheelchair   Dependence on wheelchair 10/27/2020   due to left ankle fracture 10-22-2020   History of COVID-19 09/05/2020   chest congestion, low grade fever all symptoms resolved per pt   Hypothyroidism    migraine    Skin abnormality 10/27/2020   scab on left knee and left toes healing, using splint on left leg   Vitamin B 12 deficiency    Vitamin D deficiency    Wears glasses    Past Surgical History:  Procedure Laterality Date   CESAREAN SECTION  10/29/2020   COLONOSCOPY N/A 08/07/2013   Procedure: COLONOSCOPY;  Surgeon: 08/09/2013, MD;  [facility];  Service: Endoscopy;  Laterality: N/A;  10:30-moved to [address] notified pt   ESOPHAGOGASTRODUODENOSCOPY     ESOPHAGOGASTRODUODENOSCOPY N/A 08/07/2013   Procedure: ESOPHAGOGASTRODUODENOSCOPY (EGD);  Surgeon: 08/09/2013, MD;  [facility];  Service: Endoscopy;  Laterality: N/A;   FIBULA FRACTURE SURGERY Left 10/23/2020   GASTRIC BYPASS  01/08/2002   [place], [place]   ORIF ANKLE FRACTURE Left 11/02/2020   Procedure: OPEN REDUCTION INTERNAL FIXATION (ORIF) ANKLE FRACTURE AND SYNDESMOTIC FIXATION;  Surgeon: 11/04/2020, MD;  [facility];  Service: Orthopedics;  Laterality: Left;  120   TONSILLECTOMY  Childhood   UPPER GI ENDOSCOPY  01/08/2002   Dr.Borhanian     Family History  Problem Relation Age of Onset   Heart disease Mother    Diabetes Mother    Stroke Mother    Hypertension Mother    Diabetes Father    Heart disease Father    Hypertension Father    Migraines Daughter    Colon cancer Neg Hx    Social History   Occupational History   Occupation: Environmental health practitionerDentist    Employer: [employer]  Tobacco Use   Smoking status: Every Day    Packs/day: 0.50    Years: 43.00    Pack years: 21.50    Types: Cigarettes   Smokeless tobacco: Never   Tobacco comments:    Relasped  Vaping Use   Vaping Use: Never used   Substance and Sexual Activity   Alcohol use: No    Alcohol/week: 0.0 standard drinks   Drug use: Not Currently    Comment: hx of  prescrition narcotic dependence sober date jan 21st 2014   Sexual activity: Yes    Tobacco Counseling Ready to quit: Not Answered Counseling given: Not Answered Tobacco comments: Relasped   Immunizations and Health Maintenance Immunization History  Administered Date(s) Administered   Fluad Quad(high Dose 65+) 09/18/2019, 02/03/2021   Influenza Inj Mdck Quad Pf 10/16/2016   Influenza,inj,Quad PF,6+ Mos 11/21/2015, 08/29/2018   Influenza-Unspecified 10/08/2012, 10/09/2014, 09/06/2017   Moderna Sars-Covid-2 Vaccination 01/17/2019, 02/14/2019, 12/25/2019   Pneumococcal Conjugate-13 11/21/2015   Pneumococcal Polysaccharide-23 11/02/2016   Tdap 11/23/2014   Health Maintenance Due  Topic Date Due   Zoster Vaccines- Shingrix (1 of 2) Never done   COVID-19 Vaccine (4 - Booster for Moderna series) 02/19/2020   OPHTHALMOLOGY EXAM  06/21/2020   MAMMOGRAM  09/25/2020   DEXA SCAN  Never done    Activities of Daily Living In your present state of health, do you have any difficulty performing the following activities: 02/03/2021 11/02/2020  Hearing? N -  Vision? N -  Difficulty concentrating or making decisions? N -  Walking or climbing stairs? N -  Dressing or bathing? N -  Doing errands, shopping? N N  Preparing Food and eating ? N -  Using the Toilet? N -  In the past six months, have you accidently leaked urine? N -  Do you have problems with loss of bowel control? N -  Managing your Medications? N -  Managing your Finances? N -  Housekeeping or managing your Housekeeping? N -  Some recent data might be hidden    Physical Exam   Advanced Directives: Does Patient Have a Medical Advance Directive?: Yes Type of Advance Directive: Healthcare Power of Attorney, Living will Does patient want to make changes to medical advance directive?: No -  Patient declined Copy of Healthcare Power of Attorney in Chart?: No - copy requested    Assessment:    This is a routine wellness examination for this patient .   Vision/Hearing screen Hearing Screening - Comments:: No hearing concerns  Vision Screening - Comments:: Patient is planning on scheduling and eye exam with Dr.Shapiro   Dietary issues and exercise activities discussed:  Current Exercise Habits: The patient does not participate in regular exercise at present   Goals   None   Depression Screen PHQ 2/9 Scores 02/03/2021 09/18/2019 02/07/2018 11/21/2015  PHQ - 2 Score 0 0 0 0  Exception Documentation - Other- indicate reason in comment box - -  Not completed -  Seeing a therapist - -     Fall Risk Fall Risk  02/03/2021  Falls in the past year? 1  Number falls in past yr: 1  Injury with Fall? 1  Risk for fall due to : History of fall(s)  Follow up Falls evaluation completed    Cognitive Function: MMSE - Mini Mental State Exam 02/03/2021  Orientation to time 5  Orientation to Place 5  Registration 3  Attention/ Calculation 5  Recall 3  Language- name 2 objects 2  Language- repeat 1  Language- follow 3 step command 3  Language- read & follow direction 1  Write a sentence 1  Copy design 1  Total score 30        Patient Care Team: [name], NP as PCP - General (Geriatric Medicine) [name], DO as Consulting Physician (Geriatric Medicine) [name], MD as Consulting Physician (Hematology) Jethro Bolus, MD as Consulting Physician (Ophthalmology) [place], Elvin So, MD as Referring Physician (Dermatology)     Plan:     I have personally reviewed and noted the following in the patients chart:   Medical and social history Use of alcohol, tobacco or illicit drugs  Current medications and supplements Functional ability and status Nutritional status Physical activity Advanced directives List of other  physicians Hospitalizations, surgeries, and ER visits in previous 12 months Vitals Screenings to include cognitive, depression, and falls Referrals and appointments  In addition, I have reviewed and discussed with patient certain preventive protocols, quality metrics, and best practice recommendations. A written personalized care plan for preventive services as well as general preventive health recommendations were provided to patient.     [name], NP 02/03/2021

## 2021-02-03 NOTE — Patient Instructions (Signed)
April [name] , Thank you for taking time to come for your Medicare Wellness Visit. I appreciate your ongoing commitment to your health goals. Please review the following plan we discussed and let me know if I can assist you in the future.   Screening recommendations/referrals: Colonoscopy up to date Mammogram ordered today Bone Density ordered today Recommended yearly ophthalmology/optometry visit for glaucoma screening and checkup Recommended yearly dental visit for hygiene and checkup  Vaccinations: Influenza vaccine up to date Pneumococcal vaccine up to date Tdap vaccine up to date Shingles vaccine RECOMMENDED to get at local pharmacy COVID BOOSTER- recommended to get at local pharmacy    Advanced directives: recommended to bring to follow to place on file  Conditions/risks identified: fracture, fall risk  Next appointment: yearly    Preventive Care 65 Years and Older, Female Preventive care refers to lifestyle choices and visits with your health care provider that can promote health and wellness. What does preventive care include? A yearly physical exam. This is also called an annual well check. Dental exams once or twice a year. Routine eye exams. Ask your health care provider how often you should have your eyes checked. Personal lifestyle choices, including: Daily care of your teeth and gums. Regular physical activity. Eating a healthy diet. Avoiding tobacco and drug use. Limiting alcohol use. Practicing safe sex. Taking low-dose aspirin every day. Taking vitamin and mineral supplements as recommended by your health care provider. What happens during an annual well check? The services and screenings done by your health care provider during your annual well check will depend on your age, overall health, lifestyle risk factors, and family history of disease. Counseling  Your health care provider may ask you questions about your: Alcohol use. Tobacco use. Drug  use. Emotional well-being. Home and relationship well-being. Sexual activity. Eating habits. History of falls. Memory and ability to understand (cognition). Work and work Astronomer. Reproductive health. Screening  You may have the following tests or measurements: Height, weight, and BMI. Blood pressure. Lipid and cholesterol levels. These may be checked every 5 years, or more frequently if you are over 79 years old. Skin check. Lung cancer screening. You may have this screening every year starting at age 20 if you have a 30-pack-year history of smoking and currently smoke or have quit within the past 15 years. Fecal occult blood test (FOBT) of the stool. You may have this test every year starting at age 75. Flexible sigmoidoscopy or colonoscopy. You may have a sigmoidoscopy every 5 years or a colonoscopy every 10 years starting at age 73. Hepatitis C blood test. Hepatitis B blood test. Sexually transmitted disease (STD) testing. Diabetes screening. This is done by checking your blood sugar (glucose) after you have not eaten for a while (fasting). You may have this done every 1-3 years. Bone density scan. This is done to screen for osteoporosis. You may have this done starting at age 12. Mammogram. This may be done every 1-2 years. Talk to your health care provider about how often you should have regular mammograms. Talk with your health care provider about your test results, treatment options, and if necessary, the need for more tests. Vaccines  Your health care provider may recommend certain vaccines, such as: Influenza vaccine. This is recommended every year. Tetanus, diphtheria, and acellular pertussis (Tdap, Td) vaccine. You may need a Td booster every 10 years. Zoster vaccine. You may need this after age 69. Pneumococcal 13-valent conjugate (PCV13) vaccine. One dose is recommended after age 20.  Pneumococcal polysaccharide (PPSV23) vaccine. One dose is recommended after age  41. Talk to your health care provider about which screenings and vaccines you need and how often you need them. This information is not intended to replace advice given to you by your health care provider. Make sure you discuss any questions you have with your health care provider. Document Released: 01/21/2015 Document Revised: 09/14/2015 Document Reviewed: 10/26/2014 Elsevier Interactive Patient Education  2017 [employer] Prevention in the Home Falls can cause injuries. They can happen to people of all ages. There are many things you can do to make your home safe and to help prevent falls. What can I do on the outside of my home? Regularly fix the edges of walkways and driveways and fix any cracks. Remove anything that might make you trip as you walk through a door, such as a raised step or threshold. Trim any bushes or trees on the path to your home. Use bright outdoor lighting. Clear any walking paths of anything that might make someone trip, such as rocks or tools. Regularly check to see if handrails are loose or broken. Make sure that both sides of any steps have handrails. Any raised decks and porches should have guardrails on the edges. Have any leaves, snow, or ice cleared regularly. Use sand or salt on walking paths during winter. Clean up any spills in your garage right away. This includes oil or grease spills. What can I do in the bathroom? Use night lights. Install grab bars by the toilet and in the tub and shower. Do not use towel bars as grab bars. Use non-skid mats or decals in the tub or shower. If you need to sit down in the shower, use a plastic, non-slip stool. Keep the floor dry. Clean up any water that spills on the floor as soon as it happens. Remove soap buildup in the tub or shower regularly. Attach bath mats securely with double-sided non-slip rug tape. Do not have throw rugs and other things on the floor that can make you trip. What can I do in the  bedroom? Use night lights. Make sure that you have a light by your bed that is easy to reach. Do not use any sheets or blankets that are too big for your bed. They should not hang down onto the floor. Have a firm chair that has side arms. You can use this for support while you get dressed. Do not have throw rugs and other things on the floor that can make you trip. What can I do in the kitchen? Clean up any spills right away. Avoid walking on wet floors. Keep items that you use a lot in easy-to-reach places. If you need to reach something above you, use a strong step stool that has a grab bar. Keep electrical cords out of the way. Do not use floor polish or wax that makes floors slippery. If you must use wax, use non-skid floor wax. Do not have throw rugs and other things on the floor that can make you trip. What can I do with my stairs? Do not leave any items on the stairs. Make sure that there are handrails on both sides of the stairs and use them. Fix handrails that are broken or loose. Make sure that handrails are as long as the stairways. Check any carpeting to make sure that it is firmly attached to the stairs. Fix any carpet that is loose or worn. Avoid having throw rugs at the  top or bottom of the stairs. If you do have throw rugs, attach them to the floor with carpet tape. Make sure that you have a light switch at the top of the stairs and the bottom of the stairs. If you do not have them, ask someone to add them for you. What else can I do to help prevent falls? Wear shoes that: Do not have high heels. Have rubber bottoms. Are comfortable and fit you well. Are closed at the toe. Do not wear sandals. If you use a stepladder: Make sure that it is fully opened. Do not climb a closed stepladder. Make sure that both sides of the stepladder are locked into place. Ask someone to hold it for you, if possible. Clearly mark and make sure that you can see: Any grab bars or  handrails. First and last steps. Where the edge of each step is. Use tools that help you move around (mobility aids) if they are needed. These include: Canes. Walkers. Scooters. Crutches. Turn on the lights when you go into a dark area. Replace any light bulbs as soon as they burn out. Set up your furniture so you have a clear path. Avoid moving your furniture around. If any of your floors are uneven, fix them. If there are any pets around you, be aware of where they are. Review your medicines with your doctor. Some medicines can make you feel dizzy. This can increase your chance of falling. Ask your doctor what other things that you can do to help prevent falls. This information is not intended to replace advice given to you by your health care provider. Make sure you discuss any questions you have with your health care provider. Document Released: 10/21/2008 Document Revised: 06/02/2015 Document Reviewed: 01/29/2014 Elsevier Interactive Patient Education  2017 [employer].

## 2021-02-06 ENCOUNTER — Encounter (HOSPITAL_COMMUNITY): Payer: Self-pay | Admitting: Hematology

## 2021-02-08 ENCOUNTER — Other Ambulatory Visit: Payer: Self-pay

## 2021-02-08 ENCOUNTER — Ambulatory Visit (HOSPITAL_COMMUNITY)
Admission: RE | Admit: 2021-02-08 | Discharge: 2021-02-08 | Disposition: A | Discharge status: Home / Self Care | Payer: Medicare Other | Source: Ambulatory Visit | Attending: Nurse Practitioner | Admitting: Nurse Practitioner

## 2021-02-08 DIAGNOSIS — Z0000 Encounter for general adult medical examination without abnormal findings: Secondary | ICD-10-CM | POA: Diagnosis not present

## 2021-02-08 DIAGNOSIS — E2839 Other primary ovarian failure: Secondary | ICD-10-CM | POA: Insufficient documentation

## 2021-02-08 DIAGNOSIS — Z1231 Encounter for screening mammogram for malignant neoplasm of breast: Secondary | ICD-10-CM | POA: Diagnosis not present

## 2021-02-08 DIAGNOSIS — Z780 Asymptomatic menopausal state: Secondary | ICD-10-CM | POA: Diagnosis not present

## 2021-02-08 DIAGNOSIS — M810 Age-related osteoporosis without current pathological fracture: Secondary | ICD-10-CM | POA: Diagnosis not present

## 2021-02-14 ENCOUNTER — Encounter (HOSPITAL_COMMUNITY): Payer: Self-pay | Admitting: Hematology

## 2021-02-20 ENCOUNTER — Other Ambulatory Visit: Payer: Self-pay | Admitting: *Deleted

## 2021-02-20 MED ORDER — ROMOSOZUMAB-AQQG 105 MG/1.17ML ~~LOC~~ SOSY: 210.0000 mg | PREFILLED_SYRINGE | Freq: Once | SUBCUTANEOUS | 11 refills | Status: AC

## 2021-02-20 NOTE — Telephone Encounter (Signed)
Misty Stanley came to me because she spoke with Patient's insurance to get [place] approved and they told her that an order had to be faxed to [facility] Fax:[phone]  Misty Stanley has spoke with Patient and she wants the order faxed so she can get Injection Friday.   Faxed.   [name], NP  02/09/2021  8:36 AM EST     Osteoporosis noted on bone density, recommend starting evenity once monthly for 12 month followed by prolia for treatment of osteoporosis. If she is willing to start medication  Misty Stanley can start the prior authorization process.  Also recommended to take calcium 600 mg twice daily with Vitamin D 2000 units daily and weight bearing activity 30 mins/5 days a week

## 2021-02-24 ENCOUNTER — Encounter: Payer: Self-pay | Admitting: Nurse Practitioner

## 2021-02-24 ENCOUNTER — Other Ambulatory Visit: Payer: Self-pay

## 2021-02-24 ENCOUNTER — Ambulatory Visit (INDEPENDENT_AMBULATORY_CARE_PROVIDER_SITE_OTHER): Payer: Medicare Other | Admitting: Nurse Practitioner

## 2021-02-24 VITALS — BP 124/78 | HR 81 | Temp 97.1°F | Ht 68.0 in | Wt 181.0 lb

## 2021-02-24 DIAGNOSIS — K909 Intestinal malabsorption, unspecified: Secondary | ICD-10-CM

## 2021-02-24 DIAGNOSIS — E538 Deficiency of other specified B group vitamins: Secondary | ICD-10-CM | POA: Diagnosis not present

## 2021-02-24 DIAGNOSIS — M8000XS Age-related osteoporosis with current pathological fracture, unspecified site, sequela: Secondary | ICD-10-CM | POA: Diagnosis not present

## 2021-02-24 DIAGNOSIS — E559 Vitamin D deficiency, unspecified: Secondary | ICD-10-CM

## 2021-02-24 DIAGNOSIS — F325 Major depressive disorder, single episode, in full remission: Secondary | ICD-10-CM

## 2021-02-24 DIAGNOSIS — S82852D Displaced trimalleolar fracture of left lower leg, subsequent encounter for closed fracture with routine healing: Secondary | ICD-10-CM | POA: Diagnosis not present

## 2021-02-24 DIAGNOSIS — E034 Atrophy of thyroid (acquired): Secondary | ICD-10-CM | POA: Diagnosis not present

## 2021-02-24 DIAGNOSIS — E785 Hyperlipidemia, unspecified: Secondary | ICD-10-CM | POA: Diagnosis not present

## 2021-02-24 DIAGNOSIS — E119 Type 2 diabetes mellitus without complications: Secondary | ICD-10-CM

## 2021-02-24 NOTE — Patient Instructions (Signed)
We will call you to schedule evenity injection

## 2021-02-24 NOTE — Progress Notes (Signed)
Careteam: Patient Care Team: [name], NP as PCP - General (Geriatric Medicine) [name], DO as Consulting Physician (Geriatric Medicine) [name], MD as Consulting Physician (Hematology) Jethro Bolus, MD as Consulting Physician (Ophthalmology) [place], Elvin So, MD as Referring Physician (Dermatology)  PLACE OF SERVICE:  [facility]  Advanced Directive information Does Patient Have a Medical Advance Directive?: Yes, Type of Advance Directive: Healthcare Power of [place];Living will, Does patient want to make changes to medical advance directive?: No - Patient declined  No Known Allergies  Chief Complaint  Patient presents with   Medical Management of Chronic Issues    6 month follow-up. FYI- Evenity not received from [facility], patient will be called once received to schedule a nurse visit. Marland Kitchen Discuss need for shingrix, covid boosters, A1c and eye exam or postpone if patient refuses. Patient denies receiving any vaccines since last visit.       HPI: Patient is a 66 y.o. female here for 6 months follow up.  Pt stated her left ankle is still swollen. Has gotten better since left ankle surgery. Has some pain with flexion but other wise denies pain; not taking any pain medication for it. Has one more follow up with ortho scheduled.   Did slip out of the shower after ankle surgery and injured heel but did not require surgery. Ortho follow up appointment the 03/10/20.  Bone density completed and did show osteoporosis- she will start evenity once received from pharmacy  Depression: States it is stable on Lexapro 10 mg and has no issues.  Diabetes: States, it is stable, no longer on medications since bypass surgery.   Diet:  describes diet as a variety of food options; avoid sugars; consumes adequate water and protein.  Exercise: Walks around at work. No other physical activity.   Smoker: Yes, 1/2 pack a day. Has tried bupropion. Not ready to wean of  cigarettes.   Plans to get shingrix, covid boosters, eye exam.  Review of Systems:  Review of Systems  Constitutional:  Negative for chills, fever and malaise/fatigue.  Respiratory:  Negative for cough, shortness of breath and wheezing.   Cardiovascular:  Negative for chest pain, palpitations and leg swelling.  Gastrointestinal:  Negative for abdominal pain, constipation, diarrhea, nausea and vomiting.  Genitourinary:  Negative for dysuria and frequency.  Musculoskeletal:  Positive for back pain (managed). Negative for joint pain and myalgias.       Left foot/ankle swelling since fracture and surgery  Neurological:  Negative for dizziness, tingling, weakness and headaches.  Psychiatric/Behavioral:  Negative for depression. The patient does not have insomnia.    Past Medical History:  Diagnosis Date   Anemia    Anxiety    Chronic back pain    Closed left ankle fracture 10/22/2020   in wheelchair   Dependence on wheelchair 10/27/2020   due to left ankle fracture 10-22-2020   History of COVID-19 09/05/2020   chest congestion, low grade fever all symptoms resolved per pt   Hypothyroidism    migraine    Skin abnormality 10/27/2020   scab on left knee and left toes healing, using splint on left leg   Vitamin B 12 deficiency    Vitamin D deficiency    Wears glasses    Past Surgical History:  Procedure Laterality Date   CESAREAN SECTION  3474,2595   COLONOSCOPY N/A 08/07/2013   Procedure: COLONOSCOPY;  Surgeon: West Bali, MD;  Location: [facility];  Service: Endoscopy;  Laterality:  N/A;  10:30-moved to [address] notified pt   ESOPHAGOGASTRODUODENOSCOPY     ESOPHAGOGASTRODUODENOSCOPY N/A 08/07/2013   Procedure: ESOPHAGOGASTRODUODENOSCOPY (EGD);  Surgeon: West BaliSandi L Fields, MD;  Location: [facility];  Service: Endoscopy;  Laterality: N/A;   FIBULA FRACTURE SURGERY Left 10/23/2020   [place]  [place]08[place]   ORIF ANKLE FRACTURE Left 11/02/2020    Procedure: OPEN REDUCTION INTERNAL FIXATION (ORIF) ANKLE FRACTURE AND SYNDESMOTIC FIXATION;  Surgeon: Netta Cedarsamanathan, Deepak, MD;  Location: [facility] OR;  Service: Orthopedics;  Laterality: Left;  120   TONSILLECTOMY     Childhood   UPPER GI ENDOSCOPY  01/08/2002   Dr.Borhanian    Social History:   reports that she has been smoking cigarettes. She has a 21.50 pack-year smoking history. She has never used smokeless tobacco. She reports that she does not currently use drugs. She reports that she does not drink alcohol.  Family History  Problem Relation Age of Onset   Heart disease Mother    Diabetes Mother    Stroke Mother    Hypertension Mother    Diabetes Father    Heart disease Father    Hypertension Father    Migraines Daughter    Colon cancer Neg Hx     Medications: Patient's Medications  New Prescriptions   No medications on file  Previous Medications   ACETAMINOPHEN (TYLENOL) 500 MG TABLET    Take 500 mg by mouth every 6 (six) hours as needed for mild pain or headache.   CYANOCOBALAMIN (,VITAMIN B-12,) 1000 MCG/ML INJECTION    Inject 1 mL (1,000 mcg total) into the skin every 30 (thirty) days.   ESCITALOPRAM (LEXAPRO) 10 MG TABLET    TAKE ONE TABLET BY MOUTH ONCE DAILY.   FOLIC ACID (FOLVITE) 1 MG TABLET    TAKE (1) TABLET BY MOUTH ONCE DAILY.   LEVOTHYROXINE (SYNTHROID) 50 MCG TABLET    TAKE ONE TABLET BY MOUTH ONCE DAILY.   NAPROXEN SODIUM (ALEVE) 220 MG TABLET    Take 220 mg by mouth.   TIZANIDINE (ZANAFLEX) 4 MG TABLET    Take 4 mg by mouth 2 (two) times daily as needed (migraine headaches.).   VITAMIN D, ERGOCALCIFEROL, (DRISDOL) 1.25 MG (50000 UNIT) CAPS CAPSULE    TAKE 1 CAPSULE BY MOUTH ONCE A WEEK.   ZONISAMIDE (ZONEGRAN) 100 MG CAPSULE    Take 200 mg by mouth at bedtime.  Modified Medications   No medications on file  Discontinued Medications   No medications on file    Physical Exam:  Vitals:   02/24/21 0920  BP: 124/78  Pulse: 81  Temp: (!) 97.1 F (36.2 C)   TempSrc: Temporal  SpO2: 96%  Weight: 181 lb (82.1 kg)  Height: 5\' 8"  (1.727 m)   Body mass index is 27.52 kg/m. Wt Readings from Last 3 Encounters:  02/24/21 181 lb (82.1 kg)  02/03/21 185 lb 9.6 oz (84.2 kg)  11/02/20 183 lb (83 kg)    Physical Exam Constitutional:      General: She is not in acute distress.    Appearance: Normal appearance. She is not ill-appearing.  Eyes:     Conjunctiva/sclera: Conjunctivae normal.  Cardiovascular:     Rate and Rhythm: Normal rate and regular rhythm.     Pulses: Normal pulses.     Heart sounds: Normal heart sounds.  Pulmonary:     Effort: Pulmonary effort is normal.     Breath sounds: Normal breath sounds.  Musculoskeletal:  General: Swelling present. No signs of injury. Normal range of motion.     Right lower leg: No edema.     Left lower leg: No edema.     Comments: Left foot swelling, not red , not tender; Normal rotation, flexion, and extension of left foot.   Skin:    General: Skin is warm and dry.  Neurological:     Mental Status: She is oriented to person, place, and time.     Coordination: Coordination normal.     Gait: Gait normal.  Psychiatric:        Mood and Affect: Mood normal.        Behavior: Behavior normal.        Judgment: Judgment normal.    Labs reviewed: Basic Metabolic Panel: Recent Labs    08/19/20 0945  NA 144  K 4.3  CL 113*  CO2 29  GLUCOSE 72  BUN 14  CREATININE 0.91  CALCIUM 9.2  TSH 3.43   Liver Function Tests: Recent Labs    08/19/20 0945  AST 19  ALT 20  BILITOT 0.3  PROT 5.9*   No results for input(s): LIPASE, AMYLASE in the last 8760 hours. No results for input(s): AMMONIA in the last 8760 hours. CBC: Recent Labs    08/19/20 0945 11/02/20 1305  WBC 4.5 5.8  NEUTROABS 2,943  --   HGB 12.6 11.5*  HCT 37.7 35.8*  MCV 100.0 103.5*  PLT 311 421*   Lipid Panel: Recent Labs    08/19/20 0945  CHOL 199  HDL 68  LDLCALC 109*  TRIG 108  CHOLHDL 2.9    TSH: Recent Labs    08/19/20 0945  TSH 3.43   A1C: Lab Results  Component Value Date   HGBA1C 5.2 08/19/2020     Assessment/Plan  1. Hypothyroidism due to acquired atrophy of thyroid -Takes Synthroid 50 mcg daily  -Stable condition, no issues with medication.  2. Type 2 diabetes mellitus without complication, without long-term current use of insulin (HCC) -In remission since gastric bypass. 2014 -Encouraged dietary compliance, routine foot care/monitoring and to keep up with diabetic eye exams through ophthalmology  -A1C ordered   3. 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 Vitamin D deficiency -Ordered vitamin D level  -Will follow up with pending results  -continue on supplement  5. B12 deficiency -continue on supplement  -Ordered B 12 level  6. Hyperlipidemia, unspecified hyperlipidemia type -diet controlled  08/19/20 LDL 109 Lipid panel ordered   7. Major depression, single episode, in complete remission (HCC) - Takes Lexapro 10 mg QD -Controlled on current regimen -Will continue to monitor   8. Age-related osteoporosis with current pathological fracture, sequela -osteoporosis notd on recent dexa scan Taking Calcium 600 mg BID  Taking Vitamin D 1000  daily and 50,000  weekly Waiting for Evenity to be delivered  Will continue to monitor  9. Closed trimalleolar fracture of left ankle with routine healing, subsequent encounter -Had surgery 11/02/2020 -Still swollen, pain with flexion but improved -continues to follow up with orthopedic, questions if she will need some PT.  -Has appointment with ortho 03/10/21     Next appt: Return in about 6 months (around 08/24/2021) for routine follow up . I personally was present during the history, physical exam and medical decision-making activities of this service and have verified that the service and findings  are accurately documented in the  students note Janene Harvey. Biagio Borg  [facility] [phone]

## 2021-02-25 LAB — B12 AND FOLATE PANEL
Folate: 18.7 ng/mL
Vitamin B-12: 351 pg/mL (ref 200–1100)

## 2021-02-25 LAB — COMPLETE METABOLIC PANEL WITH GFR
AG Ratio: 1.9 (calc) (ref 1.0–2.5)
ALT: 17 U/L (ref 6–29)
AST: 17 U/L (ref 10–35)
Albumin: 3.7 g/dL (ref 3.6–5.1)
Alkaline phosphatase (APISO): 89 U/L (ref 37–153)
BUN: 14 mg/dL (ref 7–25)
CO2: 26 mmol/L (ref 20–32)
Calcium: 8.8 mg/dL (ref 8.6–10.4)
Chloride: 110 mmol/L (ref 98–110)
Creat: 0.85 mg/dL (ref 0.50–1.05)
Globulin: 2 g/dL (calc) (ref 1.9–3.7)
Glucose, Bld: 80 mg/dL (ref 65–99)
Potassium: 4.7 mmol/L (ref 3.5–5.3)
Sodium: 143 mmol/L (ref 135–146)
Total Bilirubin: 0.3 mg/dL (ref 0.2–1.2)
Total Protein: 5.7 g/dL — ABNORMAL LOW (ref 6.1–8.1)
eGFR: 76 mL/min/{1.73_m2} (ref >=60)

## 2021-02-25 LAB — LIPID PANEL
Cholesterol: 170 mg/dL (ref <200)
HDL: 51 mg/dL (ref >=50)
LDL Cholesterol (Calc): 101 mg/dL (calc) — ABNORMAL HIGH
Non-HDL Cholesterol (Calc): 119 mg/dL (calc) (ref <130)
Total CHOL/HDL Ratio: 3.3 (calc) (ref <5.0)
Triglycerides: 89 mg/dL (ref <150)

## 2021-02-25 LAB — HEMOGLOBIN A1C
Hgb A1c MFr Bld: 5.3 % of total Hgb (ref <5.7)
Mean Plasma Glucose: 105 mg/dL
eAG (mmol/L): 5.8 mmol/L

## 2021-02-25 LAB — CBC WITH DIFFERENTIAL/PLATELET
Absolute Monocytes: 343 cells/uL (ref 200–950)
Basophils Absolute: 40 cells/uL (ref 0–200)
Basophils Relative: 0.9 %
Eosinophils Absolute: 141 cells/uL (ref 15–500)
Eosinophils Relative: 3.2 %
HCT: 33.2 % — ABNORMAL LOW (ref 35.0–45.0)
Hemoglobin: 10.9 g/dL — ABNORMAL LOW (ref 11.7–15.5)
Lymphs Abs: 1509 cells/uL (ref 850–3900)
MCH: 32.5 pg (ref 27.0–33.0)
MCHC: 32.8 g/dL (ref 32.0–36.0)
MCV: 99.1 fL (ref 80.0–100.0)
MPV: 10.4 fL (ref 7.5–12.5)
Monocytes Relative: 7.8 %
Neutro Abs: 2367 cells/uL (ref 1500–7800)
Neutrophils Relative %: 53.8 %
Platelets: 345 10*3/uL (ref 140–400)
RBC: 3.35 10*6/uL — ABNORMAL LOW (ref 3.80–5.10)
RDW: 11.8 % (ref 11.0–15.0)
Total Lymphocyte: 34.3 %
WBC: 4.4 10*3/uL (ref 3.8–10.8)

## 2021-02-25 LAB — IRON,TIBC AND FERRITIN PANEL
%SAT: 50 % (calc) — ABNORMAL HIGH (ref 16–45)
Ferritin: 207 ng/mL (ref 16–288)
Iron: 111 ug/dL (ref 45–160)
TIBC: 224 mcg/dL (calc) — ABNORMAL LOW (ref 250–450)

## 2021-02-25 LAB — VITAMIN D 25 HYDROXY (VIT D DEFICIENCY, FRACTURES): Vit D, 25-Hydroxy: 82 ng/mL (ref 30–100)

## 2021-03-10 ENCOUNTER — Ambulatory Visit (INDEPENDENT_AMBULATORY_CARE_PROVIDER_SITE_OTHER): Payer: Medicare Other | Admitting: *Deleted

## 2021-03-10 ENCOUNTER — Other Ambulatory Visit: Payer: Self-pay

## 2021-03-10 DIAGNOSIS — S82852A Displaced trimalleolar fracture of left lower leg, initial encounter for closed fracture: Secondary | ICD-10-CM | POA: Diagnosis not present

## 2021-03-10 DIAGNOSIS — M8000XS Age-related osteoporosis with current pathological fracture, unspecified site, sequela: Secondary | ICD-10-CM | POA: Diagnosis not present

## 2021-03-10 DIAGNOSIS — M79672 Pain in left foot: Secondary | ICD-10-CM | POA: Diagnosis not present

## 2021-03-10 MED ORDER — ROMOSOZUMAB-AQQG 105 MG/1.17ML ~~LOC~~ SOSY
210.0000 mg | PREFILLED_SYRINGE | Freq: Once | SUBCUTANEOUS | Status: AC
  Administered 2021-03-10: 210 mg via SUBCUTANEOUS

## 2021-03-28 ENCOUNTER — Telehealth: Payer: Self-pay

## 2021-03-28 NOTE — Telephone Encounter (Signed)
Incoming fax received from CoverMyMeds to initiate a prior authorization for Evenity ? ?Key: [id number] ? ?Side Note: Patient has not tried any of the step therapy recommendations  ? ? ? ?Awaiting reply  ?

## 2021-03-29 ENCOUNTER — Other Ambulatory Visit: Payer: Self-pay | Admitting: Nurse Practitioner

## 2021-03-29 NOTE — Telephone Encounter (Signed)
High risk or very high risk warning populated when attempting to refill Lexapro. RX request sent to PCP for review and approval if warranted.  ? ?

## 2021-03-30 ENCOUNTER — Other Ambulatory Visit: Payer: Self-pay | Admitting: *Deleted

## 2021-03-30 NOTE — Telephone Encounter (Signed)
Misty Stanley came to me to refill patient's Evenity.  ?Rx was sent to [facility] on 02/20/2021 for a years supply.  ?Patient is going to call [facility] to release refill and have it shipped to our office.  ?

## 2021-04-03 ENCOUNTER — Encounter: Payer: Self-pay | Admitting: Nurse Practitioner

## 2021-04-04 NOTE — Telephone Encounter (Signed)
Received Notice of Denial for Evenity. April [name] [phone] is stating that April [name] is DENIED because it is not on patient's plan's drug list (formulary). Medication authorization requires the following: ? ?1.)Need to try two (2) of these covered drugs: ? A)Alendronate ? B)Forteo or Teriparatide ? C)Ibandronate ? D)Prolia ? E)Risedronate ? F)Tymlos ? ?2.)OR Doctor needs to give specific medical reasons why two (2) of the covered drugs are not appropriate for patient.  ? ? ?Patient has received one Evenity Injection so far but they told patient that was on "Emergency Basis" only and will not cover the injection unless these requests are made.  ? ?Please Advise.  ? ? ? ? ? ?Member ID: [id number] ?Reference #: [id number] ? ?

## 2021-04-04 NOTE — Telephone Encounter (Signed)
She has not been on treatment in the past, if agreeable we can start with prolia injection every 6 months since Evenity is not covered.  ?

## 2021-04-12 ENCOUNTER — Ambulatory Visit: Payer: Medicare Other

## 2021-04-24 ENCOUNTER — Encounter (HOSPITAL_COMMUNITY): Payer: Self-pay | Admitting: Hematology

## 2021-05-03 ENCOUNTER — Telehealth: Payer: Self-pay | Admitting: *Deleted

## 2021-05-03 NOTE — Telephone Encounter (Signed)
Called and [facility] for patient to return call.  ? ?Calling patient to inform her that her [place] was Verified and PA Approved and her Copay will be $435 at time of service 4828/2023 for her Evenity Injection.  ? ?Awaiting callback.  ?

## 2021-05-04 NOTE — Telephone Encounter (Signed)
Patient notified and agreed with Copay. Confirmed appointment 05/05/2021 for Evenity injection.  ?

## 2021-05-05 ENCOUNTER — Ambulatory Visit (INDEPENDENT_AMBULATORY_CARE_PROVIDER_SITE_OTHER): Payer: Medicare Other

## 2021-05-05 DIAGNOSIS — M8000XS Age-related osteoporosis with current pathological fracture, unspecified site, sequela: Secondary | ICD-10-CM

## 2021-05-05 MED ORDER — ROMOSOZUMAB-AQQG 105 MG/1.17ML ~~LOC~~ SOSY
105.0000 mg | PREFILLED_SYRINGE | Freq: Once | SUBCUTANEOUS | Status: DC
Start: 2021-05-05 — End: 2021-05-05

## 2021-05-05 MED ORDER — ROMOSOZUMAB-AQQG 105 MG/1.17ML ~~LOC~~ SOSY
210.0000 mg | PREFILLED_SYRINGE | Freq: Once | SUBCUTANEOUS | Status: AC
  Administered 2021-05-05: 210 mg via SUBCUTANEOUS

## 2021-05-31 DIAGNOSIS — M542 Cervicalgia: Secondary | ICD-10-CM | POA: Diagnosis not present

## 2021-05-31 DIAGNOSIS — G518 Other disorders of facial nerve: Secondary | ICD-10-CM | POA: Diagnosis not present

## 2021-05-31 DIAGNOSIS — M791 Myalgia, unspecified site: Secondary | ICD-10-CM | POA: Diagnosis not present

## 2021-05-31 DIAGNOSIS — G43111 Migraine with aura, intractable, with status migrainosus: Secondary | ICD-10-CM | POA: Diagnosis not present

## 2021-05-31 DIAGNOSIS — G43719 Chronic migraine without aura, intractable, without status migrainosus: Secondary | ICD-10-CM | POA: Diagnosis not present

## 2021-06-09 ENCOUNTER — Ambulatory Visit (INDEPENDENT_AMBULATORY_CARE_PROVIDER_SITE_OTHER): Payer: Medicare Other | Admitting: *Deleted

## 2021-06-09 DIAGNOSIS — M8000XS Age-related osteoporosis with current pathological fracture, unspecified site, sequela: Secondary | ICD-10-CM | POA: Diagnosis not present

## 2021-06-09 MED ORDER — ROMOSOZUMAB-AQQG 105 MG/1.17ML ~~LOC~~ SOSY
210.0000 mg | PREFILLED_SYRINGE | Freq: Once | SUBCUTANEOUS | Status: AC
  Administered 2021-06-09: 210 mg via SUBCUTANEOUS

## 2021-07-14 ENCOUNTER — Ambulatory Visit (INDEPENDENT_AMBULATORY_CARE_PROVIDER_SITE_OTHER): Payer: Medicare Other

## 2021-07-14 DIAGNOSIS — M810 Age-related osteoporosis without current pathological fracture: Secondary | ICD-10-CM | POA: Diagnosis not present

## 2021-07-14 DIAGNOSIS — M8000XS Age-related osteoporosis with current pathological fracture, unspecified site, sequela: Secondary | ICD-10-CM | POA: Diagnosis not present

## 2021-07-14 MED ORDER — ROMOSOZUMAB-AQQG 105 MG/1.17ML ~~LOC~~ SOSY
105.0000 mg | PREFILLED_SYRINGE | Freq: Once | SUBCUTANEOUS | Status: AC
  Administered 2021-07-14: 105 mg via SUBCUTANEOUS

## 2021-08-18 ENCOUNTER — Ambulatory Visit (INDEPENDENT_AMBULATORY_CARE_PROVIDER_SITE_OTHER): Payer: Medicare Other | Admitting: *Deleted

## 2021-08-18 DIAGNOSIS — M8000XS Age-related osteoporosis with current pathological fracture, unspecified site, sequela: Secondary | ICD-10-CM | POA: Diagnosis not present

## 2021-08-18 MED ORDER — ROMOSOZUMAB-AQQG 105 MG/1.17ML ~~LOC~~ SOSY
210.0000 mg | PREFILLED_SYRINGE | Freq: Once | SUBCUTANEOUS | Status: AC
  Administered 2021-08-18: 210 mg via SUBCUTANEOUS

## 2021-08-24 NOTE — Progress Notes (Deleted)
Careteam: Patient Care Team: [name], NP as PCP - General (Geriatric Medicine) [name], DO as Consulting Physician (Geriatric Medicine) [name], MD as Consulting Physician (Hematology) Jethro Bolus, MD as Consulting Physician (Ophthalmology) Haverstock, Elvin So, MD as Referring Physician (Dermatology)  PLACE OF SERVICE:  [facility]  Advanced Directive information    No Known Allergies  No chief complaint on file.    HPI: Patient is a 66 y.o. female ***  Review of Systems:  ROS***  Past Medical History:  Diagnosis Date   Anemia    Anxiety    Chronic back pain    Closed left ankle fracture 10/22/2020   in wheelchair   Dependence on wheelchair 10/27/2020   due to left ankle fracture 10-22-2020   History of COVID-19 09/05/2020   chest congestion, low grade fever all symptoms resolved per pt   Hypothyroidism    migraine    Skin abnormality 10/27/2020   scab on left knee and left toes healing, using splint on left leg   Vitamin B 12 deficiency    Vitamin D deficiency    Wears glasses    Past Surgical History:  Procedure Laterality Date   CESAREAN SECTION  8413,2440   COLONOSCOPY N/A 08/07/2013   Procedure: COLONOSCOPY;  Surgeon: West Bali, MD;  [facility];  Service: Endoscopy;  Laterality: N/A;  10:30-moved to [address] notified pt   ESOPHAGOGASTRODUODENOSCOPY     ESOPHAGOGASTRODUODENOSCOPY N/A 08/07/2013   Procedure: ESOPHAGOGASTRODUODENOSCOPY (EGD);  Surgeon: West Bali, MD;  [facility];  Service: Endoscopy;  Laterality: N/A;   FIBULA FRACTURE SURGERY Left 10/23/2020   GASTRIC BYPASS  01/08/2002   [place], [place]   ORIF ANKLE FRACTURE Left 11/02/2020   Procedure: OPEN REDUCTION INTERNAL FIXATION (ORIF) ANKLE FRACTURE AND SYNDESMOTIC FIXATION;  Surgeon: Netta Cedars, MD;  Location: [facility] OR;  Service: Orthopedics;  Laterality: Left;  120   TONSILLECTOMY     Childhood   UPPER GI  ENDOSCOPY  01/08/2002   Dr.Borhanian    Social History:   reports that she has been smoking cigarettes. She has a 21.50 pack-year smoking history. She has never used smokeless tobacco. She reports that she does not currently use drugs. She reports that she does not drink alcohol.  Family History  Problem Relation Age of Onset   Heart disease Mother    Diabetes Mother    Stroke Mother    Hypertension Mother    Diabetes Father    Heart disease Father    Hypertension Father    Migraines Daughter    Colon cancer Neg Hx     Medications: Patient's Medications  New Prescriptions   No medications on file  Previous Medications   ACETAMINOPHEN (TYLENOL) 500 MG TABLET    Take 500 mg by mouth every 6 (six) hours as needed for mild pain or headache.   CYANOCOBALAMIN (,VITAMIN B-12,) 1000 MCG/ML INJECTION    Inject 1 mL (1,000 mcg total) into the skin every 30 (thirty) days.   ESCITALOPRAM (LEXAPRO) 10 MG TABLET    TAKE ONE TABLET BY MOUTH ONCE DAILY.   FOLIC ACID (FOLVITE) 1 MG TABLET    TAKE (1) TABLET BY MOUTH ONCE DAILY.   LEVOTHYROXINE (SYNTHROID) 50 MCG TABLET    TAKE ONE TABLET BY MOUTH ONCE DAILY.   NAPROXEN SODIUM (ALEVE) 220 MG TABLET    Take 220 mg by mouth.   ROMOSOZUMAB-AQQG (EVENITY) 105 MG/1. SOSY INJECTION  Inject 210 mg into the skin once.   TIZANIDINE (ZANAFLEX) 4 MG TABLET    Take 4 mg by mouth 2 (two) times daily as needed (migraine headaches.).   VITAMIN D, ERGOCALCIFEROL, (DRISDOL) 1.25 MG (50000 UNIT) CAPS CAPSULE    TAKE 1 CAPSULE BY MOUTH ONCE A WEEK.   ZONISAMIDE (ZONEGRAN) 100 MG CAPSULE    Take 200 mg by mouth at bedtime.  Modified Medications   No medications on file  Discontinued Medications   No medications on file    Physical Exam:  There were no vitals filed for this visit. There is no height or weight on file to calculate BMI. Wt Readings from Last 3 Encounters:  02/24/21 181 lb (82.1 kg)  02/03/21 185 lb 9.6 oz (84.2 kg)  11/02/20 183 lb (83  kg)    Physical Exam***  Labs reviewed: Basic Metabolic Panel: Recent Labs    02/24/21 1013  NA 143  K 4.7  CL 110  CO2 26  GLUCOSE 80  BUN 14  CREATININE 0.85  CALCIUM 8.8   Liver Function Tests: Recent Labs    02/24/21 1013  AST 17  ALT 17  BILITOT 0.3  PROT 5.7*   No results for input(s): "LIPASE", "AMYLASE" in the last 8760 hours. No results for input(s): "AMMONIA" in the last 8760 hours. CBC: Recent Labs    11/02/20 1305 02/24/21 1013  WBC 5.8 4.4  NEUTROABS  --  2,367  HGB 11.5* 10.9*  HCT 35.8* 33.2*  MCV 103.5* 99.1  PLT 421* 345   Lipid Panel: Recent Labs    02/24/21 1013  CHOL 170  HDL 51  LDLCALC 101*  TRIG 89  CHOLHDL 3.3   TSH: No results for input(s): "TSH" in the last 8760 hours. A1C: Lab Results  Component Value Date   HGBA1C 5.3 02/24/2021     Assessment/Plan There are no diagnoses linked to this encounter.  No follow-ups on file.: *** Estrellita Lasky K. Biagio Borg  [facility] [phone]

## 2021-08-25 ENCOUNTER — Encounter: Payer: Self-pay | Admitting: Nurse Practitioner

## 2021-08-25 ENCOUNTER — Encounter: Payer: Medicare Other | Admitting: Nurse Practitioner

## 2021-08-25 DIAGNOSIS — E785 Hyperlipidemia, unspecified: Secondary | ICD-10-CM

## 2021-08-25 DIAGNOSIS — K909 Intestinal malabsorption, unspecified: Secondary | ICD-10-CM

## 2021-08-25 DIAGNOSIS — E119 Type 2 diabetes mellitus without complications: Secondary | ICD-10-CM

## 2021-08-25 DIAGNOSIS — M8000XS Age-related osteoporosis with current pathological fracture, unspecified site, sequela: Secondary | ICD-10-CM

## 2021-08-25 DIAGNOSIS — E034 Atrophy of thyroid (acquired): Secondary | ICD-10-CM

## 2021-08-25 DIAGNOSIS — F325 Major depressive disorder, single episode, in full remission: Secondary | ICD-10-CM

## 2021-08-25 DIAGNOSIS — E559 Vitamin D deficiency, unspecified: Secondary | ICD-10-CM

## 2021-08-25 DIAGNOSIS — E538 Deficiency of other specified B group vitamins: Secondary | ICD-10-CM

## 2021-08-28 NOTE — Progress Notes (Signed)
No show for appt. 

## 2021-08-29 ENCOUNTER — Other Ambulatory Visit: Payer: Self-pay | Admitting: *Deleted

## 2021-08-29 DIAGNOSIS — D508 Other iron deficiency anemias: Secondary | ICD-10-CM

## 2021-08-30 ENCOUNTER — Inpatient Hospital Stay: Payer: Medicare Other | Attending: Hematology

## 2021-08-30 DIAGNOSIS — M542 Cervicalgia: Secondary | ICD-10-CM | POA: Diagnosis not present

## 2021-08-30 DIAGNOSIS — D508 Other iron deficiency anemias: Secondary | ICD-10-CM

## 2021-08-30 DIAGNOSIS — G518 Other disorders of facial nerve: Secondary | ICD-10-CM | POA: Diagnosis not present

## 2021-08-30 DIAGNOSIS — E559 Vitamin D deficiency, unspecified: Secondary | ICD-10-CM | POA: Insufficient documentation

## 2021-08-30 DIAGNOSIS — E538 Deficiency of other specified B group vitamins: Secondary | ICD-10-CM | POA: Diagnosis not present

## 2021-08-30 DIAGNOSIS — D509 Iron deficiency anemia, unspecified: Secondary | ICD-10-CM | POA: Insufficient documentation

## 2021-08-30 DIAGNOSIS — G43719 Chronic migraine without aura, intractable, without status migrainosus: Secondary | ICD-10-CM | POA: Diagnosis not present

## 2021-08-30 DIAGNOSIS — M791 Myalgia, unspecified site: Secondary | ICD-10-CM | POA: Diagnosis not present

## 2021-08-30 LAB — VITAMIN D 25 HYDROXY (VIT D DEFICIENCY, FRACTURES): Vit D, 25-Hydroxy: 50.79 ng/mL (ref 30–100)

## 2021-08-30 LAB — IRON AND TIBC
Iron: 73 ug/dL (ref 28–170)
Saturation Ratios: 24 % (ref 10.4–31.8)
TIBC: 303 ug/dL (ref 250–450)
UIBC: 230 ug/dL

## 2021-08-30 LAB — FOLATE: Folate: 36.8 ng/mL (ref >5.9)

## 2021-08-30 LAB — VITAMIN B12: Vitamin B-12: 381 pg/mL (ref 180–914)

## 2021-08-30 LAB — FERRITIN: Ferritin: 146 ng/mL (ref 11–307)

## 2021-08-31 NOTE — Progress Notes (Signed)
[facility] [address] [place] [address]   CLINIC:  Medical Oncology/Hematology  PCP:  April Seller, NP [address] [place]. [place] [address] [phone]   REASON FOR VISIT:  Follow-up for iron deficiency anemia and B12 deficiency   CURRENT THERAPY:  Intermittent iron infusions and B12 injections   INTERVAL HISTORY:  April [name] 66 y.o. female returns for routine follow-up of her iron deficiency anemia and B12 deficiency.  She was last seen by Dr. Ellin Saba on 08/31/2020.  At today's visit, she reports feeling fairly well.  She broke her left ankle in October 2022, required surgery.  Otherwise, no major changes in her health.  She has been feeling more fatigued over the past 6 months.  She is taking vitamin D 50,000 units weekly and self administering monthly B12 injections at home.  No prior blood per rectum, melena, or epistaxis.  No fever, chills, shortness of breath, cough, chest pain, nausea, vomiting, abdominal pain.  No current signs or symptoms of blood clots.  She has 70% energy and 100% appetite. She endorses that she is maintaining a stable weight.   REVIEW OF SYSTEMS:    Review of Systems  Constitutional:  Positive for fatigue. Negative for appetite change, chills, diaphoresis, fever and unexpected weight change.  HENT:   Negative for lump/mass and nosebleeds.   Eyes:  Negative for eye problems.  Respiratory:  Negative for cough, hemoptysis and shortness of breath.   Cardiovascular:  Negative for chest pain, leg swelling and palpitations.  Gastrointestinal:  Negative for abdominal pain, blood in stool, constipation, diarrhea, nausea and vomiting.  Genitourinary:  Negative for hematuria.   Skin: Negative.   Neurological:  Negative for dizziness, headaches and light-headedness.  Hematological:  Does not bruise/bleed easily.      PAST MEDICAL/SURGICAL HISTORY:  Past Medical History:  Diagnosis Date   Anemia    Anxiety    Chronic  back pain    Closed left ankle fracture 10/22/2020   in wheelchair   Dependence on wheelchair 10/27/2020   due to left ankle fracture 10-22-2020   History of COVID-19 09/05/2020   chest congestion, low grade fever all symptoms resolved per pt   Hypothyroidism    migraine    Skin abnormality 10/27/2020   scab on left knee and left toes healing, using splint on left leg   Vitamin B 12 deficiency    Vitamin D deficiency    Wears glasses    Past Surgical History:  Procedure Laterality Date   CESAREAN SECTION  [id number]   COLONOSCOPY N/A 08/07/2013   Procedure: COLONOSCOPY;  Surgeon: West Bali, MD;  [facility];  Service: Endoscopy;  Laterality: N/A;  10:30-moved to 930 [address] notified pt   ESOPHAGOGASTRODUODENOSCOPY     ESOPHAGOGASTRODUODENOSCOPY N/A 08/07/2013   Procedure: ESOPHAGOGASTRODUODENOSCOPY (EGD);  Surgeon: West Bali, MD;  [facility];  Service: Endoscopy;  Laterality: N/A;   FIBULA FRACTURE SURGERY Left 10/23/2020   GASTRIC BYPASS  01/08/2002   [place], [place]   ORIF ANKLE FRACTURE Left 11/02/2020   Procedure: OPEN REDUCTION INTERNAL FIXATION (ORIF) ANKLE FRACTURE AND SYNDESMOTIC FIXATION;  Surgeon: Netta Cedars, MD;  Location: [facility];  Service: Orthopedics;  Laterality: Left;  120   TONSILLECTOMY     Childhood   UPPER GI ENDOSCOPY  01/08/2002   Dr.Borhanian      SOCIAL HISTORY:  Social History   Socioeconomic History   Marital status: Married  Spouse name: Not on file   Number of children: Not on file   Years of education: Not on file   Highest education level: Not on file  Occupational History   Occupation: Dentist    Employer: [employer]  Tobacco Use   Smoking status: Every Day    Packs/day: 0.50    Years: 43.00    Total pack years: 21.50    Types: Cigarettes   Smokeless tobacco: Never   Tobacco comments:    Relasped  Vaping Use   Vaping Use: Never used  Substance and Sexual Activity   Alcohol use:  No    Alcohol/week: 0.0 standard drinks of alcohol   Drug use: Not Currently    Comment: hx of  prescrition narcotic dependence sober date jan 21st 2014   Sexual activity: Yes  Other Topics Concern   Not on file  Social History Narrative   Married, 1990, has 2 children. Patient lives in a multi-level home. Previously smoked 1/2 PPD, smoked for 10-20 years. Drinks a minimal amount of caffeinated beverages.    Social Determinants of Health   Financial Resource Strain: Not on file  Food Insecurity: Not on file  Transportation Needs: Not on file  Physical Activity: Not on file  Stress: Not on file  Social Connections: Not on file  Intimate Partner Violence: Not on file    FAMILY HISTORY:  Family History  Problem Relation Age of Onset   Heart disease Mother    Diabetes Mother    Stroke Mother    Hypertension Mother    Diabetes Father    Heart disease Father    Hypertension Father    Migraines Daughter    Colon cancer Neg Hx     CURRENT MEDICATIONS:  Outpatient Encounter Medications as of 09/01/2021  Medication Sig Note   acetaminophen (TYLENOL) 500 MG tablet Take 500 mg by mouth every 6 (six) hours as needed for mild pain or headache.    cyanocobalamin (,VITAMIN B-12,) 1000 MCG/ML injection Inject 1 mL (1,000 mcg total) into the skin every 30 (thirty) days. 10/28/2020: 15 th of each month   escitalopram (LEXAPRO) 10 MG tablet TAKE ONE TABLET BY MOUTH ONCE DAILY.    folic acid (FOLVITE) 1 MG tablet TAKE (1) TABLET BY MOUTH ONCE DAILY.    levothyroxine (SYNTHROID) 50 MCG tablet TAKE ONE TABLET BY MOUTH ONCE DAILY.    naproxen sodium (ALEVE) 220 MG tablet Take 220 mg by mouth.    Romosozumab-aqqg (EVENITY) 105 MG/1. SOSY injection Inject 210 mg into the skin once.    tiZANidine (ZANAFLEX) 4 MG tablet Take 4 mg by mouth 2 (two) times daily as needed (migraine headaches.).    Vitamin D, Ergocalciferol, (DRISDOL) 1.25 MG (50000 UNIT) CAPS capsule TAKE 1 CAPSULE BY MOUTH ONCE A  WEEK.    zonisamide (ZONEGRAN) 100 MG capsule Take 200 mg by mouth at bedtime.    No facility-administered encounter medications on file as of 09/01/2021.    ALLERGIES:  No Known Allergies   PHYSICAL EXAM:    ECOG PERFORMANCE STATUS: 1 - Symptomatic but completely ambulatory  There were no vitals filed for this visit. There were no vitals filed for this visit. Physical Exam Constitutional:      Appearance: Normal appearance.  HENT:     Head: Normocephalic and atraumatic.     Mouth/Throat:     Mouth: Mucous membranes are moist.  Eyes:     Extraocular Movements: Extraocular movements intact.     Pupils:  Pupils are equal, round, and reactive to light.  Cardiovascular:     Rate and Rhythm: Normal rate and regular rhythm.     Pulses: Normal pulses.     Heart sounds: Normal heart sounds.  Pulmonary:     Effort: Pulmonary effort is normal.     Breath sounds: Normal breath sounds.  Abdominal:     General: Bowel sounds are normal.     Palpations: Abdomen is soft.     Tenderness: There is no abdominal tenderness.  Musculoskeletal:        General: No swelling.     Right lower leg: No edema.     Left lower leg: No edema.  Lymphadenopathy:     Cervical: No cervical adenopathy.  Skin:    General: Skin is warm and dry.  Neurological:     General: No focal deficit present.     Mental Status: She is alert and oriented to person, place, and time.  Psychiatric:        Mood and Affect: Mood normal.        Behavior: Behavior normal.      LABORATORY DATA:  I have reviewed the labs as listed.  CBC    Component Value Date/Time   WBC 4.4 02/24/2021 1013   RBC 3.35 (L) 02/24/2021 1013   HGB 10.9 (L) 02/24/2021 1013   HCT 33.2 (L) 02/24/2021 1013   PLT 345 02/24/2021 1013   MCV 99.1 02/24/2021 1013   MCH 32.5 02/24/2021 1013   MCHC 32.8 02/24/2021 1013   RDW 11.8 02/24/2021 1013   LYMPHSABS 1,509 02/24/2021 1013   MONOABS 0.3 12/11/2017 0907   EOSABS 141 02/24/2021 1013    BASOSABS 40 02/24/2021 1013      Latest Ref Rng & Units 02/24/2021   10:13 AM 08/19/2020    9:45 AM 02/19/2020    8:32 AM  CMP  Glucose 65 - 99 mg/dL 80  72  82   BUN 7 - 25 mg/dL 14  14  14    Creatinine 0.50 - 1.05 mg/dL 0.85  0.91  0.81   Sodium 135 - 146 mmol/L 143  144  140   Potassium 3.5 - 5.3 mmol/L 4.7  4.3  4.3   Chloride 98 - 110 mmol/L 110  113  108   CO2 20 - 32 mmol/L 26  29  28    Calcium 8.6 - 10.4 mg/dL 8.8  9.2  8.9   Total Protein 6.1 - 8.1 g/dL 5.7  5.9  5.6   Total Bilirubin 0.2 - 1.2 mg/dL 0.3  0.3  0.4   AST 10 - 35 U/L 17  19  15    ALT 6 - 29 U/L 17  20  12      DIAGNOSTIC IMAGING:  I have independently reviewed the relevant imaging and discussed with the patient.  ASSESSMENT & PLAN: 1.  Iron deficiency anemia - Secondary to malabsorption from gastric bypass surgery.  She is on intermittent Feraheme infusions, last one on 09/28/2016 and 10/12/2016. - Colonoscopy on 08/07/2013 with a large redundant colon and internal/external hemorrhoids. - EGD on 08/07/2013 with a Schatzki ring at GE junction and a small clean-based ulcer at anastomosis. - Last Feraheme was on 04/24/2019.  Her tiredness improved after that. - No bright red blood per rectum or melena.  - She reports mild to moderate fatigue for the past 6 months. - Labs (08/30/2021): CBC shows mild anemia with Hgb 11.7/MCV 103.8.  Ferritin 146, iron saturation 24%. - PLAN:  No indication for iron supplementation at this time.  Due to downtrending iron panel, mild anemia, and fatigue, we will recheck labs and discuss via phone visit in 6 months.  2.  Vitamin B12 deficiency - Secondary to malabsorption from gastric bypass surgery - Receiving monthly B12 injections (self-administered at home) - Labs (08/30/2021): Vitamin B12 normal at 381.  Normal folate. - PLAN: Continue monthly B12 injections.  Repeat B12 with MMA in 6 months  3.  Folate deficiency - Secondary to malabsorption from gastric bypass surgery. - She is  taking folic acid 1 mg daily - Most recent labs showed normal folate 36.8 - PLAN: Continue daily folic acid supplement  3.  Vitamin D deficiency - Secondary to malabsorption from gastric bypass surgery - Taking vitamin D 50,000 units weekly - Labs (08/30/2021): Normal vitamin D 50.79. - PLAN: Continue vitamin D.  Recheck in 6 months    All questions were answered. The patient knows to call the clinic with any problems, questions or concerns.  Medical decision making: Low  Time spent on visit: I spent 14 minutes counseling the patient face to face. The total time spent in the appointment was 20 minutes and more than 50% was on counseling.   Harriett Rush, PA-C  09/01/2021 5:35 PM

## 2021-09-01 ENCOUNTER — Inpatient Hospital Stay: Payer: Medicare Other

## 2021-09-01 ENCOUNTER — Inpatient Hospital Stay: Payer: Medicare Other | Admitting: Physician Assistant

## 2021-09-01 ENCOUNTER — Encounter: Payer: Self-pay | Admitting: Physician Assistant

## 2021-09-01 VITALS — BP 113/67 | HR 63 | Temp 98.3°F | Resp 18 | Wt 177.0 lb

## 2021-09-01 DIAGNOSIS — D519 Vitamin B12 deficiency anemia, unspecified: Secondary | ICD-10-CM

## 2021-09-01 DIAGNOSIS — S92002A Unspecified fracture of left calcaneus, initial encounter for closed fracture: Secondary | ICD-10-CM | POA: Diagnosis not present

## 2021-09-01 DIAGNOSIS — K912 Postsurgical malabsorption, not elsewhere classified: Secondary | ICD-10-CM

## 2021-09-01 DIAGNOSIS — E538 Deficiency of other specified B group vitamins: Secondary | ICD-10-CM | POA: Diagnosis not present

## 2021-09-01 DIAGNOSIS — Z9884 Bariatric surgery status: Secondary | ICD-10-CM

## 2021-09-01 DIAGNOSIS — D509 Iron deficiency anemia, unspecified: Secondary | ICD-10-CM | POA: Diagnosis not present

## 2021-09-01 DIAGNOSIS — E559 Vitamin D deficiency, unspecified: Secondary | ICD-10-CM | POA: Diagnosis not present

## 2021-09-01 DIAGNOSIS — D508 Other iron deficiency anemias: Secondary | ICD-10-CM

## 2021-09-01 DIAGNOSIS — S82852A Displaced trimalleolar fracture of left lower leg, initial encounter for closed fracture: Secondary | ICD-10-CM | POA: Diagnosis not present

## 2021-09-01 DIAGNOSIS — K909 Intestinal malabsorption, unspecified: Secondary | ICD-10-CM

## 2021-09-01 LAB — CBC WITH DIFFERENTIAL/PLATELET
Abs Immature Granulocytes: 0.01 10*3/uL (ref 0.00–0.07)
Basophils Absolute: 0 10*3/uL (ref 0.0–0.1)
Basophils Relative: 1 %
Eosinophils Absolute: 0.1 10*3/uL (ref 0.0–0.5)
Eosinophils Relative: 2 %
HCT: 35.9 % — ABNORMAL LOW (ref 36.0–46.0)
Hemoglobin: 11.7 g/dL — ABNORMAL LOW (ref 12.0–15.0)
Immature Granulocytes: 0 %
Lymphocytes Relative: 32 %
Lymphs Abs: 1.4 10*3/uL (ref 0.7–4.0)
MCH: 33.8 pg (ref 26.0–34.0)
MCHC: 32.6 g/dL (ref 30.0–36.0)
MCV: 103.8 fL — ABNORMAL HIGH (ref 80.0–100.0)
Monocytes Absolute: 0.4 10*3/uL (ref 0.1–1.0)
Monocytes Relative: 8 %
Neutro Abs: 2.5 10*3/uL (ref 1.7–7.7)
Neutrophils Relative %: 57 %
Platelets: 294 10*3/uL (ref 150–400)
RBC: 3.46 MIL/uL — ABNORMAL LOW (ref 3.87–5.11)
RDW: 12.7 % (ref 11.5–15.5)
WBC: 4.4 10*3/uL (ref 4.0–10.5)
nRBC: 0 % (ref 0.0–0.2)

## 2021-09-01 NOTE — Patient Instructions (Signed)
[facility] at [facility] **VISIT SUMMARY & IMPORTANT INSTRUCTIONS **   You were seen today by Rojelio Brenner PA-C for your vitamin and mineral deficiencies related to your history of gastric bypass surgery.  Your most recent labs show that your iron, folate, vitamin D, and vitamin B12 are all within the normal range.  We will check blood count today to make sure that the anemia that was noted in February 2023 is continuing to improve.  If there are no other abnormalities on your blood work today, we will repeat labs in 6 months and discuss these results via phone visit.  ** Thank you for trusting me with your healthcare!  I strive to provide all of my patients with quality care at each visit.  If you receive a survey for this visit, I would be so grateful to you for taking the time to provide feedback.  Thank you in advance!  ~ Dejanae Helser                   Dr. [name]   &   Rojelio Brenner, PA-C   - - - - - - - - - - - - - - - - - -    Thank you for choosing [facility] at [facility] to provide your oncology and hematology care.  To afford each patient quality time with our provider, please arrive at least 15 minutes before your scheduled appointment time.   If you have a lab appointment with the Cancer Center please come in thru the Main Entrance and check in at the main information desk.  You need to re-schedule your appointment should you arrive 10 or more minutes late.  We strive to give you quality time with our providers, and arriving late affects you and other patients whose appointments are after yours.  Also, if you no show three or more times for appointments you may be dismissed from the clinic at the providers discretion.     Again, thank you for choosing [facility].  Our hope is that these requests will decrease the amount of time that you wait before being seen by our physicians.        _____________________________________________________________  Should you have questions after your visit to [facility], please contact our office at [phone] and follow the prompts.  Our office hours are 8:00 a.m. and 4:30 p.m. Monday - Friday.  Please note that voicemails left after 4:00 p.m. may not be returned until the following business day.  We are closed weekends and major holidays.  You do have access to a nurse 24-7, just call the main number to the clinic [phone] and do not press any options, hold on the line and a nurse will answer the phone.    For prescription refill requests, have your pharmacy contact our office and allow 72 hours.

## 2021-09-22 ENCOUNTER — Ambulatory Visit: Payer: Medicare Other | Admitting: *Deleted

## 2021-09-22 DIAGNOSIS — M8000XS Age-related osteoporosis with current pathological fracture, unspecified site, sequela: Secondary | ICD-10-CM

## 2021-09-22 MED ORDER — ROMOSOZUMAB-AQQG 105 MG/1.17ML ~~LOC~~ SOSY
210.0000 mg | PREFILLED_SYRINGE | Freq: Once | SUBCUTANEOUS | Status: AC
  Administered 2021-09-22: 210 mg via SUBCUTANEOUS

## 2021-10-11 DIAGNOSIS — M791 Myalgia, unspecified site: Secondary | ICD-10-CM | POA: Diagnosis not present

## 2021-10-11 DIAGNOSIS — G518 Other disorders of facial nerve: Secondary | ICD-10-CM | POA: Diagnosis not present

## 2021-10-11 DIAGNOSIS — M542 Cervicalgia: Secondary | ICD-10-CM | POA: Diagnosis not present

## 2021-10-11 DIAGNOSIS — G43111 Migraine with aura, intractable, with status migrainosus: Secondary | ICD-10-CM | POA: Diagnosis not present

## 2021-10-18 ENCOUNTER — Encounter: Payer: Self-pay | Admitting: Nurse Practitioner

## 2021-10-20 ENCOUNTER — Ambulatory Visit: Payer: Medicare Other

## 2021-10-27 ENCOUNTER — Ambulatory Visit: Payer: Medicare Other

## 2021-11-03 ENCOUNTER — Ambulatory Visit: Payer: Medicare Other | Admitting: *Deleted

## 2021-11-03 DIAGNOSIS — M8000XS Age-related osteoporosis with current pathological fracture, unspecified site, sequela: Secondary | ICD-10-CM

## 2021-11-03 MED ORDER — ROMOSOZUMAB-AQQG 105 MG/1.17ML ~~LOC~~ SOSY
210.0000 mg | PREFILLED_SYRINGE | Freq: Once | SUBCUTANEOUS | Status: AC
  Administered 2021-11-03: 210 mg via SUBCUTANEOUS

## 2021-11-11 ENCOUNTER — Other Ambulatory Visit (HOSPITAL_COMMUNITY): Payer: Self-pay | Admitting: Hematology

## 2021-12-09 ENCOUNTER — Other Ambulatory Visit (HOSPITAL_COMMUNITY): Payer: Self-pay | Admitting: Hematology

## 2021-12-11 ENCOUNTER — Encounter (HOSPITAL_COMMUNITY): Payer: Self-pay | Admitting: Hematology

## 2021-12-15 ENCOUNTER — Ambulatory Visit (INDEPENDENT_AMBULATORY_CARE_PROVIDER_SITE_OTHER): Payer: Medicare Other

## 2021-12-15 DIAGNOSIS — M8000XS Age-related osteoporosis with current pathological fracture, unspecified site, sequela: Secondary | ICD-10-CM

## 2021-12-15 MED ORDER — ROMOSOZUMAB-AQQG 105 MG/1.17ML ~~LOC~~ SOSY
210.0000 mg | PREFILLED_SYRINGE | Freq: Once | SUBCUTANEOUS | Status: AC
  Administered 2021-12-15: 210 mg via SUBCUTANEOUS

## 2022-01-16 ENCOUNTER — Telehealth: Payer: Self-pay | Admitting: *Deleted

## 2022-01-16 NOTE — Telephone Encounter (Signed)
Initiated Prior Authorization for patient's [employer] through [employer] was APPROVED 01/16/2022-01/17/2023 Authorization #: [id number]

## 2022-01-17 DIAGNOSIS — G518 Other disorders of facial nerve: Secondary | ICD-10-CM | POA: Diagnosis not present

## 2022-01-17 DIAGNOSIS — G43719 Chronic migraine without aura, intractable, without status migrainosus: Secondary | ICD-10-CM | POA: Diagnosis not present

## 2022-01-17 DIAGNOSIS — M542 Cervicalgia: Secondary | ICD-10-CM | POA: Diagnosis not present

## 2022-01-17 DIAGNOSIS — M791 Myalgia, unspecified site: Secondary | ICD-10-CM | POA: Diagnosis not present

## 2022-01-19 ENCOUNTER — Ambulatory Visit: Payer: Medicare Other

## 2022-01-26 ENCOUNTER — Ambulatory Visit (INDEPENDENT_AMBULATORY_CARE_PROVIDER_SITE_OTHER): Payer: Medicare Other

## 2022-01-26 DIAGNOSIS — M8000XS Age-related osteoporosis with current pathological fracture, unspecified site, sequela: Secondary | ICD-10-CM | POA: Diagnosis not present

## 2022-01-26 MED ORDER — ROMOSOZUMAB-AQQG 105 MG/1.17ML ~~LOC~~ SOSY
210.0000 mg | PREFILLED_SYRINGE | Freq: Once | SUBCUTANEOUS | Status: AC
  Administered 2022-01-26: 210 mg via SUBCUTANEOUS

## 2022-02-07 ENCOUNTER — Other Ambulatory Visit: Payer: Self-pay | Admitting: Hematology

## 2022-02-08 ENCOUNTER — Encounter: Payer: Medicare Other | Admitting: Nurse Practitioner

## 2022-02-22 ENCOUNTER — Inpatient Hospital Stay: Payer: Medicare Other | Attending: Hematology

## 2022-02-22 DIAGNOSIS — D508 Other iron deficiency anemias: Secondary | ICD-10-CM

## 2022-02-22 DIAGNOSIS — E538 Deficiency of other specified B group vitamins: Secondary | ICD-10-CM | POA: Insufficient documentation

## 2022-02-22 DIAGNOSIS — E559 Vitamin D deficiency, unspecified: Secondary | ICD-10-CM | POA: Insufficient documentation

## 2022-02-22 DIAGNOSIS — D509 Iron deficiency anemia, unspecified: Secondary | ICD-10-CM | POA: Insufficient documentation

## 2022-02-22 DIAGNOSIS — Z9884 Bariatric surgery status: Secondary | ICD-10-CM

## 2022-02-22 DIAGNOSIS — K912 Postsurgical malabsorption, not elsewhere classified: Secondary | ICD-10-CM

## 2022-02-22 DIAGNOSIS — D519 Vitamin B12 deficiency anemia, unspecified: Secondary | ICD-10-CM

## 2022-02-22 LAB — CBC WITH DIFFERENTIAL/PLATELET
Abs Immature Granulocytes: 0.01 10*3/uL (ref 0.00–0.07)
Basophils Absolute: 0 10*3/uL (ref 0.0–0.1)
Basophils Relative: 1 %
Eosinophils Absolute: 0.1 10*3/uL (ref 0.0–0.5)
Eosinophils Relative: 2 %
HCT: 33.8 % — ABNORMAL LOW (ref 36.0–46.0)
Hemoglobin: 11.1 g/dL — ABNORMAL LOW (ref 12.0–15.0)
Immature Granulocytes: 0 %
Lymphocytes Relative: 30 %
Lymphs Abs: 1.4 10*3/uL (ref 0.7–4.0)
MCH: 33.7 pg (ref 26.0–34.0)
MCHC: 32.8 g/dL (ref 30.0–36.0)
MCV: 102.7 fL — ABNORMAL HIGH (ref 80.0–100.0)
Monocytes Absolute: 0.3 10*3/uL (ref 0.1–1.0)
Monocytes Relative: 7 %
Neutro Abs: 2.7 10*3/uL (ref 1.7–7.7)
Neutrophils Relative %: 60 %
Platelets: 286 10*3/uL (ref 150–400)
RBC: 3.29 MIL/uL — ABNORMAL LOW (ref 3.87–5.11)
RDW: 12.5 % (ref 11.5–15.5)
WBC: 4.5 10*3/uL (ref 4.0–10.5)
nRBC: 0 % (ref 0.0–0.2)

## 2022-02-22 LAB — COMPREHENSIVE METABOLIC PANEL
ALT: 14 U/L (ref 0–44)
AST: 15 U/L (ref 15–41)
Albumin: 3.6 g/dL (ref 3.5–5.0)
Alkaline Phosphatase: 104 U/L (ref 38–126)
Anion gap: 3 — ABNORMAL LOW (ref 5–15)
BUN: 16 mg/dL (ref 8–23)
CO2: 23 mmol/L (ref 22–32)
Calcium: 7.9 mg/dL — ABNORMAL LOW (ref 8.9–10.3)
Chloride: 110 mmol/L (ref 98–111)
Creatinine, Ser: 0.85 mg/dL (ref 0.44–1.00)
GFR, Estimated: >60 mL/min (ref >60)
Glucose, Bld: 119 mg/dL — ABNORMAL HIGH (ref 70–99)
Potassium: 3.7 mmol/L (ref 3.5–5.1)
Sodium: 136 mmol/L (ref 135–145)
Total Bilirubin: 0.1 mg/dL — ABNORMAL LOW (ref 0.3–1.2)
Total Protein: 6.1 g/dL — ABNORMAL LOW (ref 6.5–8.1)

## 2022-02-22 LAB — IRON AND TIBC
Iron: 78 ug/dL (ref 28–170)
Saturation Ratios: 30 % (ref 10.4–31.8)
TIBC: 263 ug/dL (ref 250–450)
UIBC: 185 ug/dL

## 2022-02-22 LAB — FERRITIN: Ferritin: 94 ng/mL (ref 11–307)

## 2022-02-22 LAB — VITAMIN B12: Vitamin B-12: 301 pg/mL (ref 180–914)

## 2022-02-22 LAB — RETICULOCYTES
Immature Retic Fract: 8.7 % (ref 2.3–15.9)
RBC.: 3.36 MIL/uL — ABNORMAL LOW (ref 3.87–5.11)
Retic Count, Absolute: 39.3 10*3/uL (ref 19.0–186.0)
Retic Ct Pct: 1.2 % (ref 0.4–3.1)

## 2022-02-22 LAB — FOLATE: Folate: 19.8 ng/mL (ref >5.9)

## 2022-02-22 LAB — LACTATE DEHYDROGENASE: LDH: 121 U/L (ref 98–192)

## 2022-02-24 LAB — MISC LABCORP TEST (SEND OUT): Labcorp test code: 81950

## 2022-03-01 LAB — COPPER, SERUM: Copper: 106 ug/dL (ref 80–158)

## 2022-03-02 LAB — METHYLMALONIC ACID, SERUM: Methylmalonic Acid, Quantitative: 134 nmol/L (ref 0–378)

## 2022-03-08 NOTE — Progress Notes (Signed)
VIRTUAL VISIT via [place]   I connected with April [name]  on 03/09/2022 at 1:04 PM by telephone and verified that I am speaking with the correct person using two identifiers.  Location: Patient: Home Provider: [facility]   I discussed the limitations, risks, security and privacy concerns of performing an evaluation and management service by telephone and the availability of in person appointments. I also discussed with the patient that there may be a patient responsible charge related to this service. The patient expressed understanding and agreed to proceed.  REASON FOR VISIT:  Follow-up for iron deficiency anemia and B12 deficiency   CURRENT THERAPY:  Intermittent iron infusions and B12 injections   INTERVAL HISTORY:  April [name] is contacted today for follow-up of her iron deficiency anemia and B12 deficiency.  She was last seen by Tarri Abernethy PA-C on 09/01/2021.  At today's visit, she reports feeling somewhat poorly due to current COVID-19 infection with symptoms of cough, shortness of breath, and headache.  Apart from her current respiratory virus, she has been doing well overall and denies any major changes in her health.  She does report some underlying fatigue with energy about 60 to 80%.  She denies any ice pica.  No prior blood per rectum, melena, or epistaxis.  No fever, chills, shortness of breath, cough, chest pain, nausea, vomiting, abdominal pain.    She has 50% energy and 100% appetite. She endorses that she is maintaining a stable weight.   REVIEW OF SYSTEMS:   Review of Systems  Constitutional:  Positive for malaise/fatigue. Negative for chills, diaphoresis, fever and weight loss.  Respiratory:  Positive for cough and shortness of breath (COVID).   Cardiovascular:  Negative for chest pain and palpitations.  Gastrointestinal:  Negative for abdominal pain, blood in stool, melena, nausea and vomiting.   Neurological:  Positive for headaches. Negative for dizziness.     PHYSICAL EXAM: (per limitations of virtual telephone visit)  The patient is alert and oriented x 3, exhibiting adequate mentation, good mood, and ability to speak in full sentences and execute sound judgement.  ASSESSMENT & PLAN:  1.  Iron deficiency anemia - Secondary to malabsorption from gastric bypass surgery.  She is on intermittent Feraheme infusions, last one on 09/28/2016 and 10/12/2016. - Colonoscopy on 08/07/2013 with a large redundant colon and internal/external hemorrhoids. - EGD on 08/07/2013 with a Schatzki ring at GE junction and a small clean-based ulcer at anastomosis. - Last Feraheme was on 04/24/2019.   - No bright red blood per rectum or melena. - She reports mild to moderate fatigue for the past 6 months. - Most recent labs (02/22/2022): Hgb 11.1/MCV 102.7.  Ferritin 94, iron saturation 30%.   - Mild macrocytic anemia despite adequate vitamin/mineral levels.  Normal kidney function, reticulocytes, LDH, copper.  Normal TSH.  Macrocytosis has been mild and intermittent since at least 2018.  Surveillance is appropriate due to mild degree of anemia, but would consider bone marrow biopsy if worsening unexplained macrocytic anemia. - PLAN: Recommend IV Feraheme x 1 to replete her iron stores in the setting of chronic malabsorption. - Check abdominal ultrasound due to persistent macrocytosis. - Would consider bone marrow biopsy if worsening unexplained macrocytic anemia.   2.  Vitamin B12 deficiency - Secondary to malabsorption from gastric bypass surgery - Receiving q3 week B12 injections (self-administered at home) - Labs (02/22/2022): B12 301, MMA normal. - PLAN: Continue monthly B12 injections.  Repeat B12 with MMA in 6 months   3.  Folate deficiency - Secondary to malabsorption from gastric bypass surgery. - She is taking folic acid 1 mg daily - Most recent labs showed normal folate 19.8 - PLAN: Continue  daily folic acid supplement   4.  Vitamin D deficiency - Secondary to malabsorption from gastric bypass surgery - Taking vitamin D 50,000 units weekly - Labs (02/22/2022): Normal vitamin D33.6. - PLAN: Continue vitamin D.  Recheck in 6 months  PLAN SUMMARY: >> IV Feraheme x 1 >> Abdominal ultrasound >> Labs in 6 months = CBC/D, CMP, ferritin, iron/TIBC, folate, MMA, B12, vitamin D >> OFFICE visit in 6 months (1 week after labs)     I discussed the assessment and treatment plan with the patient. The patient was provided an opportunity to ask questions and all were answered. The patient agreed with the plan and demonstrated an understanding of the instructions.   The patient was advised to call back or seek an in-person evaluation if the symptoms worsen or if the condition fails to improve as anticipated.  I provided 22 minutes of non-face-to-face time during this encounter.  Harriett Rush, PA-C 03/09/2022 1:49 PM

## 2022-03-09 ENCOUNTER — Ambulatory Visit: Payer: Medicare Other

## 2022-03-09 ENCOUNTER — Inpatient Hospital Stay: Payer: Medicare Other | Attending: Physician Assistant | Admitting: Physician Assistant

## 2022-03-09 DIAGNOSIS — D539 Nutritional anemia, unspecified: Secondary | ICD-10-CM

## 2022-03-09 DIAGNOSIS — K912 Postsurgical malabsorption, not elsewhere classified: Secondary | ICD-10-CM | POA: Diagnosis not present

## 2022-03-09 DIAGNOSIS — E559 Vitamin D deficiency, unspecified: Secondary | ICD-10-CM | POA: Diagnosis not present

## 2022-03-09 DIAGNOSIS — E538 Deficiency of other specified B group vitamins: Secondary | ICD-10-CM | POA: Insufficient documentation

## 2022-03-09 DIAGNOSIS — D519 Vitamin B12 deficiency anemia, unspecified: Secondary | ICD-10-CM | POA: Diagnosis not present

## 2022-03-09 DIAGNOSIS — D508 Other iron deficiency anemias: Secondary | ICD-10-CM | POA: Diagnosis not present

## 2022-03-09 DIAGNOSIS — D509 Iron deficiency anemia, unspecified: Secondary | ICD-10-CM | POA: Insufficient documentation

## 2022-03-09 DIAGNOSIS — Z9884 Bariatric surgery status: Secondary | ICD-10-CM

## 2022-03-09 MED ORDER — CYANOCOBALAMIN 1000 MCG/ML IJ SOLN: INTRAMUSCULAR | 11 refills | Status: DC

## 2022-03-09 MED ORDER — VITAMIN D (ERGOCALCIFEROL) 1.25 MG (50000 UNIT) PO CAPS: 50000.0000 [IU] | ORAL_CAPSULE | ORAL | 0 refills | Status: DC

## 2022-03-15 NOTE — Progress Notes (Signed)
   This service is provided via telemedicine  No vital signs collected/recorded due to the encounter was a telemedicine visit.   Location of patient (ex: home, work):  Home  Patient consents to a telephone visit: Yes, see telephone visit dated 03/16/2022  Location of the provider (ex: office, home):  [facility], Office   Name of any referring provider:  N/A  Names of all persons participating in the telemedicine service and their role in the encounter:  Placido Hangartner/CMA, Sherrie Mustache, NP, and Patient   Time spent on call:  9 min with medical assistant

## 2022-03-16 ENCOUNTER — Telehealth: Payer: Self-pay

## 2022-03-16 ENCOUNTER — Inpatient Hospital Stay: Payer: Medicare Other

## 2022-03-16 ENCOUNTER — Ambulatory Visit (INDEPENDENT_AMBULATORY_CARE_PROVIDER_SITE_OTHER): Payer: Medicare Other | Admitting: Nurse Practitioner

## 2022-03-16 ENCOUNTER — Encounter: Payer: Self-pay | Admitting: Nurse Practitioner

## 2022-03-16 VITALS — Ht 68.0 in | Wt 182.0 lb

## 2022-03-16 VITALS — BP 108/64 | HR 64 | Temp 97.8°F | Resp 18

## 2022-03-16 DIAGNOSIS — F17200 Nicotine dependence, unspecified, uncomplicated: Secondary | ICD-10-CM | POA: Diagnosis not present

## 2022-03-16 DIAGNOSIS — Z0000 Encounter for general adult medical examination without abnormal findings: Secondary | ICD-10-CM | POA: Diagnosis not present

## 2022-03-16 DIAGNOSIS — K912 Postsurgical malabsorption, not elsewhere classified: Secondary | ICD-10-CM

## 2022-03-16 DIAGNOSIS — D508 Other iron deficiency anemias: Secondary | ICD-10-CM

## 2022-03-16 DIAGNOSIS — E538 Deficiency of other specified B group vitamins: Secondary | ICD-10-CM | POA: Diagnosis not present

## 2022-03-16 DIAGNOSIS — D509 Iron deficiency anemia, unspecified: Secondary | ICD-10-CM | POA: Diagnosis not present

## 2022-03-16 DIAGNOSIS — Z9884 Bariatric surgery status: Secondary | ICD-10-CM

## 2022-03-16 MED ORDER — CETIRIZINE HCL 10 MG PO TABS
10.0000 mg | ORAL_TABLET | Freq: Once | ORAL | Status: AC
  Administered 2022-03-16: 10 mg via ORAL
  Filled 2022-03-16: qty 1

## 2022-03-16 MED ORDER — ACETAMINOPHEN 325 MG PO TABS
650.0000 mg | ORAL_TABLET | Freq: Once | ORAL | Status: AC
  Administered 2022-03-16: 650 mg via ORAL
  Filled 2022-03-16: qty 2

## 2022-03-16 MED ORDER — SODIUM CHLORIDE 0.9 % IV SOLN
510.0000 mg | Freq: Once | INTRAVENOUS | Status: AC
  Administered 2022-03-16: 510 mg via INTRAVENOUS
  Filled 2022-03-16: qty 510

## 2022-03-16 MED ORDER — SODIUM CHLORIDE 0.9 % IV SOLN: Freq: Once | INTRAVENOUS | Status: AC

## 2022-03-16 NOTE — Progress Notes (Signed)
Subjective:   April [name] is a 67 y.o. female who presents for Medicare Annual (Subsequent) preventive examination.  Review of Systems     Cardiac Risk Factors include: advanced age (>28mn, >>43women);diabetes mellitus;smoking/ tobacco exposure     Objective:    Today's Vitals   03/16/22 0825  Weight: 182 lb (82.6 kg)  Height: '5\' 8"'$  (1.727 m)   Body mass index is 27.67 kg/m.     03/16/2022    8:26 AM 03/09/2022   12:21 PM 09/01/2021    9:07 AM 02/24/2021    9:22 AM 02/03/2021    4:02 PM 11/02/2020    1:29 PM 08/31/2020    8:31 AM  Advanced Directives  Does Patient Have a Medical Advance Directive? No No No Yes Yes Yes Yes  Type of AScientist, research (medical)Living will [place] will Living will;Healthcare Power of Attorney  Does patient want to make changes to medical advance directive?    No - Patient declined No - Patient declined  No - Patient declined  Copy of [place] Chart?    No - copy requested No - copy requested  Yes - validated most recent copy scanned in chart (See row information)  Would patient like information on creating a medical advance directive?  No - Patient declined No - Patient declined        Current Medications (verified) Outpatient Encounter Medications as of 03/16/2022  Medication Sig   acetaminophen (TYLENOL) 500 MG tablet Take 500 mg by mouth every 6 (six) hours as needed for mild pain or headache.   cyanocobalamin (VITAMIN B12) 1000 MCG/ML injection INJECT 1ML INTO THE MUSCLE EVERY 21 DAYS.   escitalopram (LEXAPRO) 10 MG tablet TAKE ONE TABLET BY MOUTH ONCE DAILY.   folic acid (FOLVITE) 1 MG tablet TAKE (1) TABLET BY MOUTH ONCE DAILY.   levothyroxine (SYNTHROID) 50 MCG tablet TAKE ONE TABLET BY MOUTH ONCE DAILY.   naproxen sodium (ALEVE) 220 MG tablet Take 220 mg by mouth.   Romosozumab-aqqg (EVENITY) 105 MG/1.17ML SOSY injection  Inject 210 mg into the skin once.   tiZANidine (ZANAFLEX) 4 MG tablet Take 4 mg by mouth 2 (two) times daily as needed (migraine headaches.).   Vitamin D, Ergocalciferol, (DRISDOL) 1.25 MG (50000 UNIT) CAPS capsule Take 1 capsule (50,000 Units total) by mouth once a week.   zonisamide (ZONEGRAN) 100 MG capsule Take 200 mg by mouth at bedtime.   No facility-administered encounter medications on file as of 03/16/2022.    Allergies (verified) Patient has no known allergies.   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   due to left ankle fracture 10-22-2020   History of COVID-19 09/05/2020   chest congestion, low grade fever all symptoms resolved per pt   Hypothyroidism    migraine    Skin abnormality 10/27/2020   scab on left knee and left toes healing, using splint on left leg   Vitamin B 12 deficiency    Vitamin D deficiency    Wears glasses    Past Surgical History:  Procedure Laterality Date   CESAREAN SECTION  [id number]  COLONOSCOPY N/A 08/07/2013   Procedure: COLONOSCOPY;  Surgeon: SDanie Binder MD;  [facility];  Service: Endoscopy;  Laterality: N/A;  10:30-moved to [place] notified pt   ESOPHAGOGASTRODUODENOSCOPY     ESOPHAGOGASTRODUODENOSCOPY N/A 08/07/2013   Procedure: ESOPHAGOGASTRODUODENOSCOPY (EGD);  Surgeon: Danie Binder, MD;  [facility];  Service: Endoscopy;  Laterality: N/A;   FIBULA FRACTURE SURGERY Left 10/23/2020   GASTRIC BYPASS  01/08/2002   [place]   ORIF ANKLE FRACTURE Left 11/02/2020   Procedure: OPEN REDUCTION INTERNAL FIXATION (ORIF) ANKLE FRACTURE AND SYNDESMOTIC FIXATION;  Surgeon: Armond Hang, MD;  Location: [facility];  Service: Orthopedics;  Laterality: Left;  120   TONSILLECTOMY     Childhood   UPPER GI ENDOSCOPY  01/08/2002   Dr.Borhanian    Family History   Problem Relation Age of Onset   Heart disease Mother    Diabetes Mother    Stroke Mother    Hypertension Mother    Diabetes Father    Heart disease Father    Hypertension Father    Migraines Daughter    Colon cancer Neg Hx    Social History   Socioeconomic History   Marital status: Married    Spouse name: Not on file   Number of children: Not on file   Years of education: Not on file   Highest education level: Not on file  Occupational History   Occupation: Dentist    Employer: [name]  Tobacco Use   Smoking status: Every Day    Packs/day: 0.50    Years: 43.00    Total pack years: 21.50    Types: Cigarettes   Smokeless tobacco: Never   Tobacco comments:    Relasped  Vaping Use   Vaping Use: Never used  Substance and Sexual Activity   Alcohol use: No    Alcohol/week: 0.0 standard drinks of alcohol   Drug use: Not Currently    Comment: hx of  prescrition narcotic dependence sober date jan 21st 2014   Sexual activity: Yes  Other Topics Concern   Not on file  Social History Narrative   Married, 1990, has 2 children. Patient lives in a multi-level home. Previously smoked 1/2 PPD, smoked for 10-20 years. Drinks a minimal amount of caffeinated beverages.    Social Determinants of Health   Financial Resource Strain: Not on file  Food Insecurity: Not on file  Transportation Needs: Not on file  Physical Activity: Not on file  Stress: Not on file  Social Connections: Not on file    Tobacco Counseling Ready to quit: Not Answered Counseling given: Not Answered Tobacco comments: Relasped   Clinical Intake:  Pre-visit preparation completed: Yes  Pain : No/denies pain     BMI - recorded: 27 Nutritional Risks: None  How often do you need to have someone help you when you read instructions, pamphlets, or other written materials from your doctor or pharmacy?: 1 - Never  April [name]         Activities of Daily Living    03/16/2022    8:26 AM   In your present state of health, do you have any difficulty performing the following activities:  Hearing? 0  Vision? 0  Difficulty concentrating or making decisions? 0  Walking or climbing stairs? 0  Dressing or bathing? 0  Doing errands, shopping? 0  Preparing Food and eating ? N  Using the Toilet? N  In the past six months, have you accidently leaked urine? N  Do you have problems with loss of bowel control? N  Managing your Medications? N  Managing your Finances? N  Housekeeping or managing your Housekeeping? N    Patient Care Team: Lauree Chandler, NP as PCP - General (Geriatric Medicine) Gayland Curry, DO as Consulting Physician (Geriatric Medicine) Derek Jack, MD as Consulting Physician (Hematology) Rutherford Guys, MD as Consulting Physician (Ophthalmology) [place], Jennefer Bravo, MD as Referring Physician (Dermatology)  Indicate any recent Medical Services you may have received from other than [facility] providers in the past year (date may be approximate).     Assessment:   This is a routine wellness examination for April [name].  Hearing/Vision screen No results found.  Dietary issues and exercise activities discussed: Current Exercise Habits: Home exercise routine, Type of exercise: Other - see comments (walking), Time (Minutes): 30, Frequency (Times/Week): 1, Weekly Exercise (Minutes/Week): 30, Intensity: Mild, Exercise limited by: None identified   Goals Addressed   None    Depression Screen    03/16/2022    8:28 AM 02/24/2021    9:22 AM 02/03/2021    4:00 PM 09/18/2019    9:15 AM 02/07/2018    8:40 AM 11/21/2015    9:11 AM 11/23/2014    1:21 PM  PHQ 2/9 Scores  PHQ - 2 Score 0 0 0 0 0 0 0  Exception Documentation  Other- indicate reason in comment box  Other- indicate reason in comment box     Not completed  Seeing a therapist  Seeing a therapist       Fall Risk    03/16/2022    8:28 AM 02/24/2021    9:21 AM 02/03/2021    3:59 PM 08/19/2020    9:26  AM 09/18/2019    9:15 AM  [place] in the past year? 0 1 1 0 0  Number falls in past yr:  1 1 0 0  Injury with Fall?  1 1 0 0  Risk for fall due to :  History of fall(s) History of fall(s) No Fall Risks   Follow up  Falls evaluation completed Falls evaluation completed Falls evaluation completed     [place]:  Any stairs in or around the home? Yes  If so, are there any without handrails? Yes  Home free of loose throw rugs in walkways, pet beds, electrical cords, etc? Yes  Adequate lighting in your home to reduce risk of falls? Yes   ASSISTIVE DEVICES UTILIZED TO PREVENT FALLS:  Life alert? No  Use of a cane, walker or w/c? No  Grab bars in the bathroom? No  Shower chair or bench in shower? Yes  Elevated toilet seat or a handicapped toilet? Yes   TIMED UP AND GO:  Was the test performed? No .    Cognitive Function:    02/03/2021    4:04 PM  MMSE - Mini Mental State Exam  Orientation to time 5  Orientation to Place 5  Registration 3  Attention/ Calculation 5  Recall 3  Language- name 2 objects 2  Language- repeat 1  Language- follow 3 step command 3  Language- read & follow direction 1  Write a sentence 1  Copy design 1  Total score 30        Immunizations Immunization History  Administered Date(s) Administered   Fluad Quad(high Dose 65+) 09/18/2019, 02/03/2021   Influenza Inj Mdck Quad Pf 10/16/2016   Influenza,inj,Quad PF,6+ Mos 11/21/2015, 08/29/2018   Influenza-Unspecified 10/08/2012, 10/09/2014, 09/06/2017   Moderna Sars-Covid-2 Vaccination 01/17/2019, 02/14/2019, 12/25/2019   Pneumococcal Conjugate-13 11/21/2015  Pneumococcal Polysaccharide-23 11/02/2016   Tdap 11/23/2014    TDAP status: Up to date  Flu Vaccine status: Up to date  Pneumococcal vaccine status: Due, Education has been provided regarding the importance of this vaccine. Advised may receive this vaccine at local pharmacy or Health Dept.  Aware to provide a copy of the vaccination record if obtained from local pharmacy or Health Dept. Verbalized acceptance and understanding.  Covid-19 vaccine status: Information provided on how to obtain vaccines.   Qualifies for Shingles Vaccine? Yes   Zostavax completed No   Shingrix Completed?: Yes  Screening Tests Health Maintenance  Topic Date Due   Diabetic kidney evaluation - Urine ACR  Never done   Zoster Vaccines- Shingrix (1 of 2) Never done   Lung Cancer Screening  Never done   OPHTHALMOLOGY EXAM  06/21/2020   HEMOGLOBIN A1C  08/24/2021   COVID-19 Vaccine (4 - 2023-24 season) 09/08/2021   Pneumonia Vaccine 51+ Years old (3 of 3 - PPSV23 or PCV20) 11/02/2021   FOOT EXAM  02/03/2022   MAMMOGRAM  02/09/2023   Diabetic kidney evaluation - eGFR measurement  02/23/2023   Medicare Annual Wellness (AWV)  03/16/2023   COLONOSCOPY (Pts 45-76yr Insurance coverage will need to be confirmed)  08/08/2023   DTaP/Tdap/Td (2 - Td or Tdap) 11/22/2024   DEXA SCAN  Completed   Hepatitis C Screening  Completed   HPV VACCINES  Aged Out    Health Maintenance  Health Maintenance Due  Topic Date Due   Diabetic kidney evaluation - Urine ACR  Never done   Zoster Vaccines- Shingrix (1 of 2) Never done   Lung Cancer Screening  Never done   OPHTHALMOLOGY EXAM  06/21/2020   HEMOGLOBIN A1C  08/24/2021   COVID-19 Vaccine (4 - 2023-24 season) 09/08/2021   Pneumonia Vaccine 67 Years old (3 of 3 - PPSV23 or PCV20) 11/02/2021   FOOT EXAM  02/03/2022    Colorectal cancer screening: Type of screening: Colonoscopy. Completed 2015. Repeat every 10 years  Mammogram status: Completed 2/12023. Repeat every year  Bone Density status: Completed 02/08/21. Results reflect: Bone density results: OSTEOPOROSIS. Repeat every 2 years.  Lung Cancer Screening: (Low Dose CT Chest recommended if Age 67-80years, 30 pack-year currently smoking OR have quit w/in 15years.) does qualify.   Lung Cancer Screening  Referral: done  Additional Screening:  Hepatitis C Screening: does qualify; Completed   Vision Screening: Recommended annual ophthalmology exams for early detection of glaucoma and other disorders of the eye. Is the patient up to date with their annual eye exam?  No  Who is the provider or what is the name of the office in which the patient attends annual eye exams? [facility] If pt is not established with a provider, would they like to be referred to a provider to establish care? No .   Dental Screening: Recommended annual dental exams for proper oral hygiene  Community Resource Referral / Chronic Care Management: CRR required this visit?  No   CCM required this visit?  No      Plan:     I have personally reviewed and noted the following in the patient's chart:   Medical and social history Use of alcohol, tobacco or illicit drugs  Current medications and supplements including opioid prescriptions. Patient is not currently taking opioid prescriptions. Functional ability and status Nutritional status Physical activity Advanced directives List of other physicians Hospitalizations, surgeries, and ER visits in previous 12 months Vitals Screenings to include cognitive, depression, and  falls Referrals and appointments  In addition, I have reviewed and discussed with patient certain preventive protocols, quality metrics, and best practice recommendations. A written personalized care plan for preventive services as well as general preventive health recommendations were provided to patient.     Lauree Chandler, NP   03/16/2022   Virtual Visit via Video Note  I connected with April [name] on 03/16/22 at  8:20 AM EST by a video enabled telemedicine application and verified that I am speaking with the correct person using two identifiers.  Location: Patient: home  Provider: [facility]   I discussed the limitations of evaluation and management by telemedicine and the availability of  in person appointments. The patient expressed understanding and agreed to proceed.    I discussed the assessment and treatment plan with the patient. The patient was provided an opportunity to ask questions and all were answered. The patient agreed with the plan and demonstrated an understanding of the instructions.   The patient was advised to call back or seek an in-person evaluation if the symptoms worsen or if the condition fails to improve as anticipated.  I provided 15 minutes of non-face-to-face time during this encounter.  Carlos American. Dewaine Oats, AGNP Avs printed and mailed.

## 2022-03-16 NOTE — Patient Instructions (Signed)
April [name] , Thank you for taking time to come for your Medicare Wellness Visit. I appreciate your ongoing commitment to your health goals. Please review the following plan we discussed and let me know if I can assist you in the future.   Screening recommendations/referrals: Colonoscopy up to date Mammogram up to date Bone Density up to date Recommended yearly ophthalmology/optometry visit for glaucoma screening and checkup Recommended yearly dental visit for hygiene and checkup  Vaccinations: Influenza vaccine- due annually in September/October Pneumococcal vaccine -will get at next follow up Tdap vaccine -up to date Shingles vaccine up to ate    Advanced directives: bring copy to place on file  Conditions/risks identified: advanced age  Next appointment: yearly    Preventive Care 36 Years and Older, Female Preventive care refers to lifestyle choices and visits with your health care provider that can promote health and wellness. What does preventive care include? A yearly physical exam. This is also called an annual well check. Dental exams once or twice a year. Routine eye exams. Ask your health care provider how often you should have your eyes checked. Personal lifestyle choices, including: Daily care of your teeth and gums. Regular physical activity. Eating a healthy diet. Avoiding tobacco and drug use. Limiting alcohol use. Practicing safe sex. Taking low-dose aspirin every day. Taking vitamin and mineral supplements as recommended by your health care provider. What happens during an annual well check? The services and screenings done by your health care provider during your annual well check will depend on your age, overall health, lifestyle risk factors, and family history of disease. Counseling  Your health care provider may ask you questions about your: Alcohol use. Tobacco use. Drug use. Emotional well-being. Home and relationship well-being. Sexual  activity. Eating habits. History of falls. Memory and ability to understand (cognition). Work and work Statistician. Reproductive health. Screening  You may have the following tests or measurements: Height, weight, and BMI. Blood pressure. Lipid and cholesterol levels. These may be checked every 5 years, or more frequently if you are over 67 years old. Skin check. Lung cancer screening. You may have this screening every year starting at age 67 if you have a 30-pack-year history of smoking and currently smoke or have quit within the past 15 years. Fecal occult blood test (FOBT) of the stool. You may have this test every year starting at age 67. Flexible sigmoidoscopy or colonoscopy. You may have a sigmoidoscopy every 5 years or a colonoscopy every 10 years starting at age 67. Hepatitis C blood test. Hepatitis B blood test. Sexually transmitted disease (STD) testing. Diabetes screening. This is done by checking your blood sugar (glucose) after you have not eaten for a while (fasting). You may have this done every 1-3 years. Bone density scan. This is done to screen for osteoporosis. You may have this done starting at age 67. Mammogram. This may be done every 1-2 years. Talk to your health care provider about how often you should have regular mammograms. Talk with your health care provider about your test results, treatment options, and if necessary, the need for more tests. Vaccines  Your health care provider may recommend certain vaccines, such as: Influenza vaccine. This is recommended every year. Tetanus, diphtheria, and acellular pertussis (Tdap, Td) vaccine. You may need a Td booster every 10 years. Zoster vaccine. You may need this after age 67. Pneumococcal 13-valent conjugate (PCV13) vaccine. One dose is recommended after age 25. Pneumococcal polysaccharide (PPSV23) vaccine. One dose is recommended after  age 25. Talk to your health care provider about which screenings and vaccines  you need and how often you need them. This information is not intended to replace advice given to you by your health care provider. Make sure you discuss any questions you have with your health care provider. Document Released: 01/21/2015 Document Revised: 09/14/2015 Document Reviewed: 10/26/2014 Elsevier Interactive Patient Education  2017 [employer] Prevention in the Home Falls can cause injuries. They can happen to people of all ages. There are many things you can do to make your home safe and to help prevent falls. What can I do on the outside of my home? Regularly fix the edges of walkways and driveways and fix any cracks. Remove anything that might make you trip as you walk through a door, such as a raised step or threshold. Trim any bushes or trees on the path to your home. Use bright outdoor lighting. Clear any walking paths of anything that might make someone trip, such as rocks or tools. Regularly check to see if handrails are loose or broken. Make sure that both sides of any steps have handrails. Any raised decks and porches should have guardrails on the edges. Have any leaves, snow, or ice cleared regularly. Use sand or salt on walking paths during winter. Clean up any spills in your garage right away. This includes oil or grease spills. What can I do in the bathroom? Use night lights. Install grab bars by the toilet and in the tub and shower. Do not use towel bars as grab bars. Use non-skid mats or decals in the tub or shower. If you need to sit down in the shower, use a plastic, non-slip stool. Keep the floor dry. Clean up any water that spills on the floor as soon as it happens. Remove soap buildup in the tub or shower regularly. Attach bath mats securely with double-sided non-slip rug tape. Do not have throw rugs and other things on the floor that can make you trip. What can I do in the bedroom? Use night lights. Make sure that you have a light by your bed that  is easy to reach. Do not use any sheets or blankets that are too big for your bed. They should not hang down onto the floor. Have a firm chair that has side arms. You can use this for support while you get dressed. Do not have throw rugs and other things on the floor that can make you trip. What can I do in the kitchen? Clean up any spills right away. Avoid walking on wet floors. Keep items that you use a lot in easy-to-reach places. If you need to reach something above you, use a strong step stool that has a grab bar. Keep electrical cords out of the way. Do not use floor polish or wax that makes floors slippery. If you must use wax, use non-skid floor wax. Do not have throw rugs and other things on the floor that can make you trip. What can I do with my stairs? Do not leave any items on the stairs. Make sure that there are handrails on both sides of the stairs and use them. Fix handrails that are broken or loose. Make sure that handrails are as long as the stairways. Check any carpeting to make sure that it is firmly attached to the stairs. Fix any carpet that is loose or worn. Avoid having throw rugs at the top or bottom of the stairs. If you do  have throw rugs, attach them to the floor with carpet tape. Make sure that you have a light switch at the top of the stairs and the bottom of the stairs. If you do not have them, ask someone to add them for you. What else can I do to help prevent falls? Wear shoes that: Do not have high heels. Have rubber bottoms. Are comfortable and fit you well. Are closed at the toe. Do not wear sandals. If you use a stepladder: Make sure that it is fully opened. Do not climb a closed stepladder. Make sure that both sides of the stepladder are locked into place. Ask someone to hold it for you, if possible. Clearly mark and make sure that you can see: Any grab bars or handrails. First and last steps. Where the edge of each step is. Use tools that help you  move around (mobility aids) if they are needed. These include: Canes. Walkers. Scooters. Crutches. Turn on the lights when you go into a dark area. Replace any light bulbs as soon as they burn out. Set up your furniture so you have a clear path. Avoid moving your furniture around. If any of your floors are uneven, fix them. If there are any pets around you, be aware of where they are. Review your medicines with your doctor. Some medicines can make you feel dizzy. This can increase your chance of falling. Ask your doctor what other things that you can do to help prevent falls. This information is not intended to replace advice given to you by your health care provider. Make sure you discuss any questions you have with your health care provider. Document Released: 10/21/2008 Document Revised: 06/02/2015 Document Reviewed: 01/29/2014 Elsevier Interactive Patient Education  2017 [employer].

## 2022-03-16 NOTE — Patient Instructions (Signed)
[facility]  Discharge Instructions: Thank you for choosing [facility] to provide your oncology and hematology care.  If you have a lab appointment with the Cancer Center, please come in thru the Main Entrance and check in at the main information desk.  Wear comfortable clothing and clothing appropriate for easy access to any Portacath or PICC line.   We strive to give you quality time with your provider. You may need to reschedule your appointment if you arrive late (15 or more minutes).  Arriving late affects you and other patients whose appointments are after yours.  Also, if you miss three or more appointments without notifying the office, you may be dismissed from the clinic at the provider's discretion.      For prescription refill requests, have your pharmacy contact our office and allow 72 hours for refills to be completed.    Ferumoxytol Injection What is this medication? FERUMOXYTOL (FER ue MOX i tol) treats low levels of iron in your body (iron deficiency anemia). Iron is a mineral that plays an important role in making red blood cells, which carry oxygen from your lungs to the rest of your body. This medicine may be used for other purposes; ask your health care provider or pharmacist if you have questions. COMMON BRAND NAME(S): Feraheme What should I tell my care team before I take this medication? They need to know if you have any of these conditions: Anemia not caused by low iron levels High levels of iron in the blood Magnetic resonance imaging (MRI) test scheduled An unusual or allergic reaction to iron, other medications, foods, dyes, or preservatives Pregnant or trying to get pregnant Breastfeeding How should I use this medication? This medication is injected into a vein. It is given by your care team in a hospital or clinic setting. Talk to your care team the use of this medication in children. Special care may be needed. Overdosage:  If you think you have taken too much of this medicine contact a poison control center or emergency room at once. NOTE: This medicine is only for you. Do not share this medicine with others. What if I miss a dose? It is important not to miss your dose. Call your care team if you are unable to keep an appointment. What may interact with this medication? Other iron products This list may not describe all possible interactions. Give your health care provider a list of all the medicines, herbs, non-prescription drugs, or dietary supplements you use. Also tell them if you smoke, drink alcohol, or use illegal drugs. Some items may interact with your medicine. What should I watch for while using this medication? Visit your care team regularly. Tell your care team if your symptoms do not start to get better or if they get worse. You may need blood work done while you are taking this medication. You may need to follow a special diet. Talk to your care team. Foods that contain iron include: whole grains/cereals, dried fruits, beans, or peas, leafy green vegetables, and organ meats (liver, kidney). What side effects may I notice from receiving this medication? Side effects that you should report to your care team as soon as possible: Allergic reactions--skin rash, itching, hives, swelling of the face, lips, tongue, or throat Low blood pressure--dizziness, feeling faint or lightheaded, blurry vision Shortness of breath Side effects that usually do not require medical attention (report to your care team if they continue or are bothersome): Flushing Headache   Joint pain Muscle pain Nausea Pain, redness, or irritation at injection site This list may not describe all possible side effects. Call your doctor for medical advice about side effects. You may report side effects to FDA at 1-800-FDA-1088. Where should I keep my medication? This medication is given in a hospital or clinic and will not be stored at  home. NOTE: This sheet is a summary. It may not cover all possible information. If you have questions about this medicine, talk to your doctor, pharmacist, or health care provider.  2023 Elsevier/Gold Standard (2020-05-18 00:00:00)    To help prevent nausea and vomiting after your treatment, we encourage you to take your nausea medication as directed.  BELOW ARE SYMPTOMS THAT SHOULD BE REPORTED IMMEDIATELY: *FEVER GREATER THAN 100.4 F (38 C) OR HIGHER *CHILLS OR SWEATING *NAUSEA AND VOMITING THAT IS NOT CONTROLLED WITH YOUR NAUSEA MEDICATION *UNUSUAL SHORTNESS OF BREATH *UNUSUAL BRUISING OR BLEEDING *URINARY PROBLEMS (pain or burning when urinating, or frequent urination) *BOWEL PROBLEMS (unusual diarrhea, constipation, pain near the anus) TENDERNESS IN MOUTH AND THROAT WITH OR WITHOUT PRESENCE OF ULCERS (sore throat, sores in mouth, or a toothache) UNUSUAL RASH, SWELLING OR PAIN  UNUSUAL VAGINAL DISCHARGE OR ITCHING   Items with * indicate a potential emergency and should be followed up as soon as possible or go to the Emergency Department if any problems should occur.  Please show the CHEMOTHERAPY ALERT CARD or IMMUNOTHERAPY ALERT CARD at check-in to the Emergency Department and triage nurse.  Should you have questions after your visit or need to cancel or reschedule your appointment, please contact [facility] [phone]  and follow the prompts.  Office hours are 8:00 a.m. to 4:30 p.m. Monday - Friday. Please note that voicemails left after 4:00 p.m. may not be returned until the following business day.  We are closed weekends and major holidays. You have access to a nurse at all times for urgent questions. Please call the main number to the clinic [phone] and follow the prompts.  For any non-urgent questions, you may also contact your provider using MyChart. We now offer e-Visits for anyone 18 and older to request care online for non-urgent symptoms. For  details visit [email or web address].   Also download the MyChart app! Go to the app store, search "MyChart", open the app, select [facility], and log in with your MyChart username and password.   

## 2022-03-16 NOTE — Progress Notes (Signed)
Patient presents today for Feraheme infusion.  Patient is in satisfactory condition with no new complaints voiced.  Vital signs are stable.  We will proceed with iron per provider orders.  Patient tolerated treatment well with no complaints voiced.  Patient left ambulatory in stable condition.  Vital signs stable at discharge.  Follow up as scheduled.

## 2022-03-16 NOTE — Telephone Encounter (Signed)
Ms. lowyn, vescovi are scheduled for a virtual visit with your provider today.    Just as we do with appointments in the office, we must obtain your consent to participate.  Your consent will be active for this visit and any virtual visit you may have with one of our providers in the next 365 days.    If you have a MyChart account, I can also send a copy of this consent to you electronically.  All virtual visits are billed to your insurance company just like a traditional visit in the office.  As this is a virtual visit, video technology does not allow for your provider to perform a traditional examination.  This may limit your provider's ability to fully assess your condition.  If your provider identifies any concerns that need to be evaluated in person or the need to arrange testing such as labs, EKG, etc, we will make arrangements to do so.    Although advances in technology are sophisticated, we cannot ensure that it will always work on either your end or our end.  If the connection with a video visit is poor, we may have to switch to a telephone visit.  With either a video or telephone visit, we are not always able to ensure that we have a secure connection.   I need to obtain your verbal consent now.   Are you willing to proceed with your visit today?   NIJHA KORPELA has provided verbal consent on 03/16/2022 for a virtual visit (video or telephone).   Marrian Salvage, [place] 03/16/2022  9:31 AM

## 2022-03-20 ENCOUNTER — Encounter: Payer: Self-pay | Admitting: Nurse Practitioner

## 2022-03-23 ENCOUNTER — Ambulatory Visit (INDEPENDENT_AMBULATORY_CARE_PROVIDER_SITE_OTHER): Payer: Medicare Other | Admitting: Nurse Practitioner

## 2022-03-23 ENCOUNTER — Ambulatory Visit (HOSPITAL_COMMUNITY)
Admission: RE | Admit: 2022-03-23 | Discharge: 2022-03-23 | Disposition: A | Discharge status: Home / Self Care | Payer: Medicare Other | Source: Ambulatory Visit | Attending: Physician Assistant | Admitting: Physician Assistant

## 2022-03-23 ENCOUNTER — Ambulatory Visit: Payer: Medicare Other

## 2022-03-23 ENCOUNTER — Encounter: Payer: Self-pay | Admitting: Nurse Practitioner

## 2022-03-23 VITALS — BP 118/74 | HR 78 | Temp 97.3°F | Ht 68.0 in | Wt 182.0 lb

## 2022-03-23 DIAGNOSIS — M810 Age-related osteoporosis without current pathological fracture: Secondary | ICD-10-CM | POA: Diagnosis not present

## 2022-03-23 DIAGNOSIS — Z23 Encounter for immunization: Secondary | ICD-10-CM | POA: Diagnosis not present

## 2022-03-23 DIAGNOSIS — R945 Abnormal results of liver function studies: Secondary | ICD-10-CM | POA: Diagnosis not present

## 2022-03-23 DIAGNOSIS — E119 Type 2 diabetes mellitus without complications: Secondary | ICD-10-CM

## 2022-03-23 DIAGNOSIS — K909 Intestinal malabsorption, unspecified: Secondary | ICD-10-CM

## 2022-03-23 DIAGNOSIS — E785 Hyperlipidemia, unspecified: Secondary | ICD-10-CM | POA: Diagnosis not present

## 2022-03-23 DIAGNOSIS — M8000XS Age-related osteoporosis with current pathological fracture, unspecified site, sequela: Secondary | ICD-10-CM

## 2022-03-23 DIAGNOSIS — E034 Atrophy of thyroid (acquired): Secondary | ICD-10-CM

## 2022-03-23 DIAGNOSIS — E559 Vitamin D deficiency, unspecified: Secondary | ICD-10-CM

## 2022-03-23 DIAGNOSIS — E538 Deficiency of other specified B group vitamins: Secondary | ICD-10-CM | POA: Diagnosis not present

## 2022-03-23 DIAGNOSIS — D539 Nutritional anemia, unspecified: Secondary | ICD-10-CM | POA: Diagnosis not present

## 2022-03-23 MED ORDER — ROMOSOZUMAB-AQQG 105 MG/1.17ML ~~LOC~~ SOSY
210.0000 mg | PREFILLED_SYRINGE | Freq: Once | SUBCUTANEOUS | Status: AC
  Administered 2022-03-23: 210 mg via SUBCUTANEOUS

## 2022-03-23 NOTE — Progress Notes (Signed)
Careteam: Patient Care Team: Lauree Chandler, NP as PCP - General (Geriatric Medicine) Gayland Curry, DO as Consulting Physician (Geriatric Medicine) Derek Jack, MD as Consulting Physician (Hematology) Rutherford Guys, MD as Consulting Physician (Ophthalmology) [place], Jennefer Bravo, MD as Referring Physician (Dermatology)  PLACE OF SERVICE:  [place] Directive information Does Patient Have a Medical Advance Directive?: Yes, Type of Advance Directive: [place];Living will, Does patient want to make changes to medical advance directive?: No - Patient declined  No Known Allergies  Chief Complaint  Patient presents with   Medical Management of Chronic Issues    Routine follow-up and foot exam. Discuss need for diabetic kidney evaluation, shingrix, eye exam (per patient she is planning on scheduling an appointment with Dr.Shapiro, for it's been greater than 12 months), A1c, covid boosters, and pneumonia vaccines or post pone if patient refuses or is not a candidate. Evenity Injection      HPI: Patient is a 67 y.o. female here for routine f/u.  She is getting an abdominal US today to check on anemia/chronically elevated MCV. This is per her hematologist. She did receive an iron infusion. Continues B12 injections. Follows up since she had gastric bypass surgery about 17 years ago.   Evenity injection today for osteoporosis.   She is going to schedule an eye exam.   Denies constipation,no changes in BM, some diarrhea is normal for her since gastric bypass. Denies fatigue, feeling hot or cold, palpitations.  Mood is doing well she continues the lexapro. She sees a therapist. She is in recovery from opioid addiction and doing well with that; has support groups and therapy.   Muscle relaxer is for migraine management she is followed by a headache specialist for this. She also takes zonegran for preventive migraine protection. She now has  them about twice a month. She also gets acupuncture injections with lidocaine and steroid at her trigger points from him.   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  Review of Systems:  Review of Systems  Constitutional:  Negative for chills, fever, malaise/fatigue and weight loss.  HENT:  Negative for congestion and sore throat.   Eyes:  Negative for blurred vision.  Respiratory:  Negative for cough, shortness of breath and wheezing.   Cardiovascular:  Negative for chest pain, palpitations and leg swelling.  Gastrointestinal:  Negative for abdominal pain, blood in stool, constipation, diarrhea, heartburn, nausea and vomiting.  Genitourinary:  Negative for dysuria, frequency, hematuria and urgency.  Musculoskeletal:  Negative for falls and joint pain.  Skin:  Negative for rash.  Neurological:  Negative for dizziness, tingling and headaches.  Endo/Heme/Allergies:  Negative for polydipsia.  Psychiatric/Behavioral:  Negative for depression. The patient is not nervous/anxious.   All other systems reviewed and are negative.   Past Medical History:  Diagnosis Date   Anemia    Anxiety    Chronic back pain    Closed left ankle fracture 10/22/2020   in wheelchair   Dependence on wheelchair 10/27/2020   due to left ankle fracture 10-22-2020   History of COVID-19 09/05/2020   chest congestion, low grade fever all symptoms resolved per pt   Hypothyroidism    migraine    Skin abnormality 10/27/2020   scab on left knee and left toes healing, using splint on left leg   Vitamin B 12 deficiency    Vitamin D deficiency    Wears glasses  Past Surgical History:  Procedure Laterality Date   CESAREAN SECTION  [id number]   COLONOSCOPY N/A 08/07/2013   Procedure: COLONOSCOPY;  Surgeon: Danie Binder, MD;  [facility];  Service: Endoscopy;  Laterality: N/A;  10:30-moved to [place] notified pt   ESOPHAGOGASTRODUODENOSCOPY      ESOPHAGOGASTRODUODENOSCOPY N/A 08/07/2013   Procedure: ESOPHAGOGASTRODUODENOSCOPY (EGD);  Surgeon: Danie Binder, MD;  [facility];  Service: Endoscopy;  Laterality: N/A;   FIBULA FRACTURE SURGERY Left 10/23/2020   GASTRIC BYPASS  01/08/2002   [place]   ORIF ANKLE FRACTURE Left 11/02/2020   Procedure: OPEN REDUCTION INTERNAL FIXATION (ORIF) ANKLE FRACTURE AND SYNDESMOTIC FIXATION;  Surgeon: Armond Hang, MD;  Location: [place];  Service: Orthopedics;  Laterality: Left;  120   TONSILLECTOMY     Childhood   UPPER GI ENDOSCOPY  01/08/2002   Dr.Borhanian    Social History:   reports that she has been smoking cigarettes. She has a 21.50 pack-year smoking history. She has never used smokeless tobacco. She reports that she does not currently use drugs. She reports that she does not drink alcohol.  Family History  Problem Relation Age of Onset   Heart disease Mother    Diabetes Mother    Stroke Mother    Hypertension Mother    Diabetes Father    Heart disease Father    Hypertension Father    Migraines Daughter    Colon cancer Neg Hx     Medications: Patient's Medications  New Prescriptions   No medications on file  Previous Medications   ACETAMINOPHEN (TYLENOL) 500 MG TABLET    Take 500 mg by mouth every 6 (six) hours as needed for mild pain or headache.   CYANOCOBALAMIN (VITAMIN B12) 1000 MCG/ML INJECTION    INJECT 1ML INTO THE MUSCLE EVERY 21 DAYS.   ESCITALOPRAM (LEXAPRO) 10 MG TABLET    TAKE ONE TABLET BY MOUTH ONCE DAILY.   FOLIC ACID (FOLVITE) 1 MG TABLET    TAKE (1) TABLET BY MOUTH ONCE DAILY.   LEVOTHYROXINE (SYNTHROID) 50 MCG TABLET    TAKE ONE TABLET BY MOUTH ONCE DAILY.   NAPROXEN SODIUM (ALEVE) 220 MG TABLET    Take 220 mg by mouth as needed.   ROMOSOZUMAB-AQQG (EVENITY) 105 MG/1.17ML SOSY INJECTION    Inject 210 mg into the skin once.   TIZANIDINE (ZANAFLEX) 4 MG TABLET    Take 4 mg by mouth 2 (two) times daily as needed (migraine headaches.).    VITAMIN D, ERGOCALCIFEROL, (DRISDOL) 1.25 MG (50000 UNIT) CAPS CAPSULE    Take 1 capsule (50,000 Units total) by mouth once a week.   ZONISAMIDE (ZONEGRAN) 100 MG CAPSULE    Take 200 mg by mouth at bedtime.  Modified Medications   No medications on file  Discontinued Medications   No medications on file    Physical Exam:  Vitals:   03/23/22 1037  BP: 118/74  Pulse: 78  Temp: (!) 97.3 F (36.3 C)  TempSrc: Temporal  SpO2: 97%  Weight: 182 lb (82.6 kg)  Height: 5\' 8"  (1.727 m)   Body mass index is 27.67 kg/m. Wt Readings from Last 3 Encounters:  03/23/22 182 lb (82.6 kg)  03/16/22 182 lb (82.6 kg)  09/01/21 177 lb 0.5 oz (80.3 kg)    Physical Exam Vitals reviewed.  Constitutional:      General: She is not in acute distress.    Appearance: Normal appearance.  Cardiovascular:  Rate and Rhythm: Normal rate and regular rhythm.  Pulmonary:     Effort: No respiratory distress.     Breath sounds: Normal breath sounds.  Abdominal:     General: Bowel sounds are normal. There is no distension.     Palpations: Abdomen is soft. There is no mass.     Tenderness: There is no abdominal tenderness. There is no guarding.  Musculoskeletal:     Cervical back: Neck supple.  Lymphadenopathy:     Cervical: No cervical adenopathy.  Skin:    General: Skin is warm and dry.  Neurological:     Mental Status: She is alert and oriented to person, place, and time.  Psychiatric:        Mood and Affect: Mood normal.     Labs reviewed: Basic Metabolic Panel: Recent Labs    02/22/22 1319  NA 136  K 3.7  CL 110  CO2 23  GLUCOSE 119*  BUN 16  CREATININE 0.85  CALCIUM 7.9*   Liver Function Tests: Recent Labs    02/22/22 1319  AST 15  ALT 14  ALKPHOS 104  BILITOT 0.1*  PROT 6.1*  ALBUMIN 3.6   No results for input(s): "LIPASE", "AMYLASE" in the last 8760 hours. No results for input(s): "AMMONIA" in the last 8760 hours. CBC: Recent Labs    09/01/21 0933 02/22/22 1319   WBC 4.4 4.5  NEUTROABS 2.5 2.7  HGB 11.7* 11.1*  HCT 35.9* 33.8*  MCV 103.8* 102.7*  PLT 294 286   Lipid Panel: No results for input(s): "CHOL", "HDL", "LDLCALC", "TRIG", "CHOLHDL", "LDLDIRECT" in the last 8760 hours. TSH: No results for input(s): "TSH" in the last 8760 hours. A1C: Lab Results  Component Value Date   HGBA1C 5.3 02/24/2021     Assessment/Plan 1. Age-related osteoporosis with current pathological fracture, sequela Continue evenity injections to transition into prolia. Continue B12 and Vit D. - Romosozumab-aqqg (EVENITY) 105 MG/1.17ML injection 210 mg  2. Hypothyroidism due to acquired atrophy of thyroid Stable. Check TSH today. Continue levothyroxine. - TSH  3. Type 2 diabetes mellitus without complication, without long-term current use of insulin ([place]) Diet-controlled. Check A1C and MCR today.  - Hemoglobin A1c - Microalbumin/Creatinine Ratio, Urine - Pneumococcal conjugate vaccine 20-valent (Prevnar 20)  4. Malabsorption of iron Follows with hematology due to hx of gastric bypass surgery, has abd [place] today to f/u on elevated MCV and chronic anemia.  5. Vitamin D deficiency Continue Vitamin D supplement.  6. Hyperlipidemia, unspecified hyperlipidemia type Lipid panel stable at last visit. Check lipids today. Patient did have coffee with creamer this AM. - Lipid panel  7. B12 deficiency Hx of gastric bypass surgery. B12 levels stable for now. Followed by hematology. Continue B12 injections.  8. Need for pneumococcal 20-valent conjugate vaccination - Pneumococcal conjugate vaccine 20-valent (Prevnar 20)    Return in about 6 months (around 09/23/2022) for routine follow up.  Student- Archer Asa O'Berry ACPCNP-S  I personally was present during the history, physical exam and medical decision-making activities of this service and have verified that the service and findings are accurately documented in the student's note Bennye Nix K. Van [place]andt, [facility] [phone]

## 2022-03-24 LAB — LIPID PANEL
Cholesterol: 161 mg/dL (ref <200)
HDL: 59 mg/dL (ref >=50)
LDL Cholesterol (Calc): 85 mg/dL (calc)
Non-HDL Cholesterol (Calc): 102 mg/dL (calc) (ref <130)
Total CHOL/HDL Ratio: 2.7 (calc) (ref <5.0)
Triglycerides: 84 mg/dL (ref <150)

## 2022-03-24 LAB — MICROALBUMIN / CREATININE URINE RATIO
Creatinine, Urine: 58 mg/dL (ref 20–275)
Microalb, Ur: <0.2 mg/dL

## 2022-03-24 LAB — HEMOGLOBIN A1C
Hgb A1c MFr Bld: 5.5 % of total Hgb (ref <5.7)
Mean Plasma Glucose: 111 mg/dL
eAG (mmol/L): 6.2 mmol/L

## 2022-03-24 LAB — TSH: TSH: 2.56 mIU/L (ref 0.40–4.50)

## 2022-03-26 ENCOUNTER — Telehealth: Payer: Self-pay

## 2022-03-26 NOTE — Telephone Encounter (Signed)
Message left on patient's voice mail to call for results or she can check her MyChart.

## 2022-03-26 NOTE — Telephone Encounter (Signed)
-----   Message from Harriett Rush, [place] sent at 03/23/2022  5:31 PM EDT ----- April [name]: If patient has not viewed her MyChart message, please call to discuss the following with her: - Her abdominal ultrasound showed "steatosis," which is also known as fatty infiltration of the liver.  This would explain her mild macrocytosis (slightly enlarged red blood cells).  Persistent fatty liver disease places patients at risk for future liver cirrhosis, so lifestyle modifications including low-fat diet and exercise are encouraged.

## 2022-04-18 DIAGNOSIS — G518 Other disorders of facial nerve: Secondary | ICD-10-CM | POA: Diagnosis not present

## 2022-04-18 DIAGNOSIS — G43719 Chronic migraine without aura, intractable, without status migrainosus: Secondary | ICD-10-CM | POA: Diagnosis not present

## 2022-04-18 DIAGNOSIS — M791 Myalgia, unspecified site: Secondary | ICD-10-CM | POA: Diagnosis not present

## 2022-04-18 DIAGNOSIS — M542 Cervicalgia: Secondary | ICD-10-CM | POA: Diagnosis not present

## 2022-05-04 ENCOUNTER — Ambulatory Visit: Payer: Medicare Other | Admitting: Nurse Practitioner

## 2022-05-04 DIAGNOSIS — M810 Age-related osteoporosis without current pathological fracture: Secondary | ICD-10-CM

## 2022-05-04 DIAGNOSIS — M8000XS Age-related osteoporosis with current pathological fracture, unspecified site, sequela: Secondary | ICD-10-CM | POA: Diagnosis not present

## 2022-05-04 MED ORDER — ROMOSOZUMAB-AQQG 105 MG/1.17ML ~~LOC~~ SOSY
210.0000 mg | PREFILLED_SYRINGE | Freq: Once | SUBCUTANEOUS | Status: AC
  Administered 2022-05-04: 210 mg via SUBCUTANEOUS

## 2022-05-04 NOTE — Progress Notes (Signed)
Patient was here for her Evenity injection

## 2022-05-09 ENCOUNTER — Other Ambulatory Visit: Payer: Self-pay | Admitting: Nurse Practitioner

## 2022-05-09 ENCOUNTER — Other Ambulatory Visit: Payer: Self-pay | Admitting: Hematology

## 2022-05-09 NOTE — Telephone Encounter (Signed)
Pharmacy requested refill. Pended Rx and sent to Jessica for approval due to HIGH ALERT Warning.  

## 2022-05-30 ENCOUNTER — Encounter: Payer: Self-pay | Admitting: Hematology

## 2022-06-08 ENCOUNTER — Telehealth: Payer: Self-pay

## 2022-06-08 ENCOUNTER — Ambulatory Visit (INDEPENDENT_AMBULATORY_CARE_PROVIDER_SITE_OTHER): Payer: Medicare Other

## 2022-06-08 DIAGNOSIS — M810 Age-related osteoporosis without current pathological fracture: Secondary | ICD-10-CM

## 2022-06-08 DIAGNOSIS — M8000XS Age-related osteoporosis with current pathological fracture, unspecified site, sequela: Secondary | ICD-10-CM

## 2022-06-08 MED ORDER — ROMOSOZUMAB-AQQG 105 MG/1.17ML ~~LOC~~ SOSY
210.0000 mg | PREFILLED_SYRINGE | Freq: Once | SUBCUTANEOUS | Status: AC
  Administered 2022-06-08: 210 mg via SUBCUTANEOUS

## 2022-06-08 NOTE — Telephone Encounter (Signed)
Patient came into office today and received Evenity injection. Patient wanted to know when she would start Prolia. She states that she was told after taking Evenity every month for 1 year she would transition to Prolia. PCP [name], NP states that patient is correct and PA for Prolia will need to be done. Please reach out to patient when this is complete and have her scheduled. Message routed to Prola/Evenity medical assistant Synetta Fail.M/CMA

## 2022-06-08 NOTE — Telephone Encounter (Signed)
Verification for Prolia Submitted through [employer].   Evenity Injections Completed.  1)03/10/2021 2)05/05/2021 3)06/09/2021 4)07/14/2021 5)08/18/2021 6)09/22/2021 7)11/03/2021 8)12/15/2021 9)01/26/2022 10)03/23/2022 11)05/04/2022 12)06/08/2022

## 2022-06-26 ENCOUNTER — Telehealth: Payer: Self-pay

## 2022-06-26 ENCOUNTER — Telehealth: Payer: Medicare Other | Admitting: *Deleted

## 2022-06-26 NOTE — Telephone Encounter (Signed)
April [name] with [facility] called to express that in order for them to proceed with the prior authorization for Prolia they need a copy of patient most recent DEXA scan faxed to [phone]. Reference Number- [id number]  DEXA scan dated 02/08/2021 was faxed as requested

## 2022-06-26 NOTE — Telephone Encounter (Signed)
Initiated Prior Authorization through [employer] for [employer]. Patient has Completed the 12 months of Evenity.   Authorization Pending and requires review by [facility] Clinical Team   Awaiting Determination.  Request Number: [id number]

## 2022-06-28 NOTE — Telephone Encounter (Signed)
2nd Prior Authorization Completed Case ID# [id number]. OV notes and Bone Density was included with the Prior Authorization.   Received fax back from Insurance and Jake Seats was DENIED. Requesting a Peer to Peer in order for Prolia to be Approved.   Shanda Bumps is to call #-[phone]

## 2022-06-28 NOTE — Telephone Encounter (Signed)
Received fax stating that Prior Authorization for Prolia was DENIED.   Resubmitted Prior Authorization through [facility] Portal with Clinical Documentation including Bone Density. Authorization Request is Pending and went into Determination.   Request #: [id number]

## 2022-06-29 DIAGNOSIS — H35361 Drusen (degenerative) of macula, right eye: Secondary | ICD-10-CM | POA: Diagnosis not present

## 2022-06-29 DIAGNOSIS — H524 Presbyopia: Secondary | ICD-10-CM | POA: Diagnosis not present

## 2022-06-29 DIAGNOSIS — H2513 Age-related nuclear cataract, bilateral: Secondary | ICD-10-CM | POA: Diagnosis not present

## 2022-06-29 DIAGNOSIS — H04123 Dry eye syndrome of bilateral lacrimal glands: Secondary | ICD-10-CM | POA: Diagnosis not present

## 2022-07-03 NOTE — Telephone Encounter (Signed)
Spoke with patient and verbalized understanding of April Seller, NP recommendation, however she would prefer that April [name] make an appeal to this decision. Patient prefers to do prolia as she mentioned " That is the recommended treatment following evenity."

## 2022-07-03 NOTE — Telephone Encounter (Signed)
Called to do a peer-to-peer but they report medication was denied and needs to do the appeal.

## 2022-07-03 NOTE — Telephone Encounter (Signed)
I called and they said it was denied.

## 2022-07-03 NOTE — Telephone Encounter (Signed)
After further discussion they will not pay for prolia because it is not on her preferred medication list. Recommended she use bisphosphonate.  If she is willing to try fosamax will send to pharmacy to take fosamax 70 mg weekly.

## 2022-07-03 NOTE — Telephone Encounter (Signed)
Providers response was sent to patient via mychart

## 2022-07-05 NOTE — Telephone Encounter (Signed)
It was a frustrating process because they reported we needed to do a peer to peer but when I called the representative said no it was denied and that they were not requesting a peer-to-peer. If there is anything else we can do on our end we will be glad to help. Jarrett Soho is also going to look at the paperwork again today to see what else we can do.

## 2022-07-11 NOTE — Telephone Encounter (Signed)
I called and left message for patient letting her know that PA is pending approval.

## 2022-07-11 NOTE — Telephone Encounter (Signed)
Great! Can you notify patient of the pending approval. Cc in anita so she is aware.

## 2022-07-11 NOTE — Telephone Encounter (Signed)
Hillary with [employer] called with some additional questions about tried and failed medications. I informed her that patient had a year course of Evenity and is now transitioning to Prolia. She stated that she didn't need any additional information and that she will place this for pending approval.

## 2022-07-20 DIAGNOSIS — H527 Unspecified disorder of refraction: Secondary | ICD-10-CM | POA: Diagnosis not present

## 2022-07-20 DIAGNOSIS — G518 Other disorders of facial nerve: Secondary | ICD-10-CM | POA: Diagnosis not present

## 2022-07-20 DIAGNOSIS — M791 Myalgia, unspecified site: Secondary | ICD-10-CM | POA: Diagnosis not present

## 2022-07-20 DIAGNOSIS — G43719 Chronic migraine without aura, intractable, without status migrainosus: Secondary | ICD-10-CM | POA: Diagnosis not present

## 2022-07-20 DIAGNOSIS — M542 Cervicalgia: Secondary | ICD-10-CM | POA: Diagnosis not present

## 2022-08-06 ENCOUNTER — Encounter: Payer: Self-pay | Admitting: Nurse Practitioner

## 2022-08-07 ENCOUNTER — Other Ambulatory Visit (HOSPITAL_COMMUNITY): Payer: Self-pay | Admitting: Nurse Practitioner

## 2022-08-07 DIAGNOSIS — Z1231 Encounter for screening mammogram for malignant neoplasm of breast: Secondary | ICD-10-CM

## 2022-08-10 ENCOUNTER — Ambulatory Visit (HOSPITAL_COMMUNITY)
Admission: RE | Admit: 2022-08-10 | Discharge: 2022-08-10 | Disposition: A | Discharge status: Home / Self Care | Payer: Medicare Other | Source: Ambulatory Visit | Attending: Nurse Practitioner | Admitting: Nurse Practitioner

## 2022-08-10 ENCOUNTER — Encounter (HOSPITAL_COMMUNITY): Payer: Self-pay

## 2022-08-10 DIAGNOSIS — Z1231 Encounter for screening mammogram for malignant neoplasm of breast: Secondary | ICD-10-CM | POA: Diagnosis not present

## 2022-08-13 NOTE — Telephone Encounter (Addendum)
Received paperwork from patient from [facility] stating that Prolia was APPROVED 07/11/2022-07/11/2022 Auth#: [id number]  Copy sent to scanning.   Verification sent to [employer] for Prolia approval.

## 2022-08-15 NOTE — Telephone Encounter (Signed)
Patient called insurance company and appealed the Denial for Prolia.   Prolia was APPROVED  Received paperwork from patient from [facility] stating that Prolia was APPROVED 07/11/2022-07/11/2022 Auth#: [id number]   Copy sent to scanning.    Verification sent to [employer] for Prolia approval and received. Patient owes a 20% Copay and $20 Admin Fee  $341  Patient notified and scheduled an appointment for 08/31/2022.

## 2022-08-20 ENCOUNTER — Other Ambulatory Visit: Payer: Self-pay | Admitting: Nurse Practitioner

## 2022-08-31 ENCOUNTER — Ambulatory Visit: Payer: Medicare Other

## 2022-08-31 ENCOUNTER — Ambulatory Visit (INDEPENDENT_AMBULATORY_CARE_PROVIDER_SITE_OTHER): Payer: Medicare Other

## 2022-08-31 DIAGNOSIS — M8000XS Age-related osteoporosis with current pathological fracture, unspecified site, sequela: Secondary | ICD-10-CM

## 2022-08-31 MED ORDER — DENOSUMAB 60 MG/ML ~~LOC~~ SOSY
60.0000 mg | PREFILLED_SYRINGE | Freq: Once | SUBCUTANEOUS | Status: AC
Start: 2022-08-31 — End: 2022-08-31
  Administered 2022-08-31: 60 mg via SUBCUTANEOUS

## 2022-09-13 ENCOUNTER — Other Ambulatory Visit: Payer: Self-pay

## 2022-09-13 DIAGNOSIS — D508 Other iron deficiency anemias: Secondary | ICD-10-CM

## 2022-09-13 DIAGNOSIS — E559 Vitamin D deficiency, unspecified: Secondary | ICD-10-CM

## 2022-09-13 DIAGNOSIS — D539 Nutritional anemia, unspecified: Secondary | ICD-10-CM

## 2022-09-14 ENCOUNTER — Inpatient Hospital Stay: Payer: Medicare Other | Attending: Hematology

## 2022-09-14 DIAGNOSIS — E559 Vitamin D deficiency, unspecified: Secondary | ICD-10-CM | POA: Diagnosis not present

## 2022-09-14 DIAGNOSIS — E538 Deficiency of other specified B group vitamins: Secondary | ICD-10-CM | POA: Diagnosis not present

## 2022-09-14 DIAGNOSIS — D509 Iron deficiency anemia, unspecified: Secondary | ICD-10-CM | POA: Diagnosis not present

## 2022-09-14 DIAGNOSIS — D539 Nutritional anemia, unspecified: Secondary | ICD-10-CM

## 2022-09-14 DIAGNOSIS — D508 Other iron deficiency anemias: Secondary | ICD-10-CM

## 2022-09-14 LAB — CBC WITH DIFFERENTIAL/PLATELET
Abs Immature Granulocytes: 0.01 10*3/uL (ref 0.00–0.07)
Basophils Absolute: 0 10*3/uL (ref 0.0–0.1)
Basophils Relative: 1 %
Eosinophils Absolute: 0.1 10*3/uL (ref 0.0–0.5)
Eosinophils Relative: 2 %
HCT: 35 % — ABNORMAL LOW (ref 36.0–46.0)
Hemoglobin: 11.3 g/dL — ABNORMAL LOW (ref 12.0–15.0)
Immature Granulocytes: 0 %
Lymphocytes Relative: 35 %
Lymphs Abs: 1.6 10*3/uL (ref 0.7–4.0)
MCH: 33.1 pg (ref 26.0–34.0)
MCHC: 32.3 g/dL (ref 30.0–36.0)
MCV: 102.6 fL — ABNORMAL HIGH (ref 80.0–100.0)
Monocytes Absolute: 0.3 10*3/uL (ref 0.1–1.0)
Monocytes Relative: 7 %
Neutro Abs: 2.5 10*3/uL (ref 1.7–7.7)
Neutrophils Relative %: 55 %
Platelets: 276 10*3/uL (ref 150–400)
RBC: 3.41 MIL/uL — ABNORMAL LOW (ref 3.87–5.11)
RDW: 12.7 % (ref 11.5–15.5)
WBC: 4.5 10*3/uL (ref 4.0–10.5)
nRBC: 0 % (ref 0.0–0.2)

## 2022-09-14 LAB — IRON AND TIBC
Iron: 89 ug/dL (ref 28–170)
Saturation Ratios: 33 % — ABNORMAL HIGH (ref 10.4–31.8)
TIBC: 270 ug/dL (ref 250–450)
UIBC: 181 ug/dL

## 2022-09-14 LAB — COMPREHENSIVE METABOLIC PANEL
ALT: 17 U/L (ref 0–44)
AST: 17 U/L (ref 15–41)
Albumin: 3.5 g/dL (ref 3.5–5.0)
Alkaline Phosphatase: 70 U/L (ref 38–126)
Anion gap: 6 (ref 5–15)
BUN: 13 mg/dL (ref 8–23)
CO2: 22 mmol/L (ref 22–32)
Calcium: 7.9 mg/dL — ABNORMAL LOW (ref 8.9–10.3)
Chloride: 110 mmol/L (ref 98–111)
Creatinine, Ser: 0.77 mg/dL (ref 0.44–1.00)
GFR, Estimated: >60 mL/min (ref >60)
Glucose, Bld: 83 mg/dL (ref 70–99)
Potassium: 3.9 mmol/L (ref 3.5–5.1)
Sodium: 138 mmol/L (ref 135–145)
Total Bilirubin: 0.5 mg/dL (ref 0.3–1.2)
Total Protein: 6.2 g/dL — ABNORMAL LOW (ref 6.5–8.1)

## 2022-09-14 LAB — FERRITIN: Ferritin: 129 ng/mL (ref 11–307)

## 2022-09-14 LAB — VITAMIN D 25 HYDROXY (VIT D DEFICIENCY, FRACTURES): Vit D, 25-Hydroxy: 51.37 ng/mL (ref 30–100)

## 2022-09-14 LAB — VITAMIN B12: Vitamin B-12: 759 pg/mL (ref 180–914)

## 2022-09-17 LAB — METHYLMALONIC ACID, SERUM: Methylmalonic Acid, Quantitative: 116 nmol/L (ref 0–378)

## 2022-09-21 ENCOUNTER — Inpatient Hospital Stay: Payer: Medicare Other | Admitting: Oncology

## 2022-09-27 ENCOUNTER — Encounter: Payer: Self-pay | Admitting: Nurse Practitioner

## 2022-09-28 ENCOUNTER — Encounter: Payer: Self-pay | Admitting: Nurse Practitioner

## 2022-09-28 ENCOUNTER — Ambulatory Visit (INDEPENDENT_AMBULATORY_CARE_PROVIDER_SITE_OTHER): Payer: Medicare Other | Admitting: Nurse Practitioner

## 2022-09-28 VITALS — BP 124/82 | HR 70 | Temp 97.1°F | Ht 68.0 in | Wt 188.0 lb

## 2022-09-28 DIAGNOSIS — E559 Vitamin D deficiency, unspecified: Secondary | ICD-10-CM | POA: Diagnosis not present

## 2022-09-28 DIAGNOSIS — F325 Major depressive disorder, single episode, in full remission: Secondary | ICD-10-CM

## 2022-09-28 DIAGNOSIS — E538 Deficiency of other specified B group vitamins: Secondary | ICD-10-CM | POA: Diagnosis not present

## 2022-09-28 DIAGNOSIS — E034 Atrophy of thyroid (acquired): Secondary | ICD-10-CM | POA: Diagnosis not present

## 2022-09-28 DIAGNOSIS — K909 Intestinal malabsorption, unspecified: Secondary | ICD-10-CM | POA: Diagnosis not present

## 2022-09-28 DIAGNOSIS — E785 Hyperlipidemia, unspecified: Secondary | ICD-10-CM | POA: Diagnosis not present

## 2022-09-28 DIAGNOSIS — E119 Type 2 diabetes mellitus without complications: Secondary | ICD-10-CM

## 2022-09-28 DIAGNOSIS — F17200 Nicotine dependence, unspecified, uncomplicated: Secondary | ICD-10-CM | POA: Diagnosis not present

## 2022-09-28 DIAGNOSIS — M8000XS Age-related osteoporosis with current pathological fracture, unspecified site, sequela: Secondary | ICD-10-CM

## 2022-09-28 NOTE — Patient Instructions (Signed)
Ask to add A1c and lipids to next blood work   Diabetic eye exam needed when seeing ophthalmology

## 2022-09-28 NOTE — Progress Notes (Signed)
Careteam: Patient Care Team: [name], NP as PCP - General (Geriatric Medicine) [name], DO as Consulting Physician (Geriatric Medicine) [name], MD as Consulting Physician (Hematology) Haverstock, Elvin So, MD as Referring Physician (Dermatology) Frazier, [place], OD (Optometry)  PLACE OF SERVICE:  [facility]  Advanced Directive information    No Known Allergies  Chief Complaint  Patient presents with   Medical Management of Chronic Issues    6 month follow-up. Discuss need for lung cancer screening, shingrix, diabetic eye exam (see eye exam dated 06/29/22), covid booster and A1c.      HPI: Patient is a 67 y.o. female presents for 27-month follow-up.  No current concerns.   [employer], started on Prolia about 3 weeks ago.  History of gastric bypass in 2004- lost about 170lbs; stays in the 175-180 lbs range. States portion control diet is going well, eats her protein first. Recent increased sugar intake, drinking [employer] often. Walking about 3x/week.  Blood work recently through hematology at [facility]; stable; last time she had iron infusion was about 6 months.   Recent eye exam but didn't have diabetic eye exam. Still smoking 1/2 pack/day. States she knows how to quit once she is ready.  Migraines- sees Dr. Neale Burly, on zonisamide and injections given in [facility]. Sugar is a trigger; was having headaches everyday prior to starting on zonisamide but they're controlled now, rarely has a headache, but takes tizanidine PRN when she does.   Mood stable now, had some issues with co-dependence due to family issues but started seeing her therapist every 2 weeks again, which is helping.  Review of Systems:  Review of Systems  Constitutional:  Negative for chills, fever, malaise/fatigue and weight loss.  Respiratory:  Negative for cough and shortness of breath.   Cardiovascular:  Negative for chest pain, palpitations and leg  swelling.  Gastrointestinal:  Negative for abdominal pain, constipation, diarrhea, nausea and vomiting.  Genitourinary:  Negative for dysuria, frequency and urgency.  Neurological:  Positive for headaches. Negative for weakness.  Psychiatric/Behavioral:  Positive for depression. The patient is not nervous/anxious.     Past Medical History:  Diagnosis Date   Anemia    Anxiety    Chronic back pain    Closed left ankle fracture 10/22/2020   in wheelchair   Dependence on wheelchair 10/27/2020   due to left ankle fracture 10-22-2020   History of COVID-19 09/05/2020   chest congestion, low grade fever all symptoms resolved per pt   Hypothyroidism    migraine    Skin abnormality 10/27/2020   scab on left knee and left toes healing, using splint on left leg   Vitamin B 12 deficiency    Vitamin D deficiency    Wears glasses    Past Surgical History:  Procedure Laterality Date   CESAREAN SECTION  7425,9563   COLONOSCOPY N/A 08/07/2013   Procedure: COLONOSCOPY;  Surgeon: West Bali, MD;  [facility];  Service: Endoscopy;  Laterality: N/A;  10:30-moved to [address] notified pt   ESOPHAGOGASTRODUODENOSCOPY     ESOPHAGOGASTRODUODENOSCOPY N/A 08/07/2013   Procedure: ESOPHAGOGASTRODUODENOSCOPY (EGD);  Surgeon: West Bali, MD;  [facility];  Service: Endoscopy;  Laterality: N/A;   FIBULA FRACTURE SURGERY Left 10/23/2020   GASTRIC BYPASS  01/08/2002   [place], [place]   ORIF ANKLE FRACTURE Left 11/02/2020   Procedure: OPEN REDUCTION INTERNAL FIXATION (ORIF) ANKLE FRACTURE AND SYNDESMOTIC FIXATION;  Surgeon: Netta Cedars, MD;  Location: [facility] OR;  Service: Orthopedics;  Laterality: Left;  120   TONSILLECTOMY     Childhood   UPPER GI ENDOSCOPY  01/08/2002   Dr.Borhanian    Social History:   reports that she has been smoking cigarettes. She has a 21.5 pack-year smoking history. She has never used smokeless tobacco. She reports that she does not currently  use drugs. She reports that she does not drink alcohol.  Family History  Problem Relation Age of Onset   Heart disease Mother    Diabetes Mother    Stroke Mother    Hypertension Mother    Diabetes Father    Heart disease Father    Hypertension Father    Migraines Daughter    Colon cancer Neg Hx     Medications: Patient's Medications  New Prescriptions   No medications on file  Previous Medications   ACETAMINOPHEN (TYLENOL) 500 MG TABLET    Take 500 mg by mouth every 6 (six) hours as needed for mild pain or headache.   CYANOCOBALAMIN (VITAMIN B12) 1000 MCG/ML INJECTION    INJECT INTO THE MUSCLE EVERY 21 DAYS.   DENOSUMAB (PROLIA) 60 MG/ML SOSY INJECTION    Inject 60 mg into the skin every 6 (six) months.   ESCITALOPRAM (LEXAPRO) 10 MG TABLET    TAKE ONE TABLET BY MOUTH ONCE DAILY.   FOLIC ACID (FOLVITE) 1 MG TABLET    TAKE (1) TABLET BY MOUTH ONCE DAILY.   LEVOTHYROXINE (SYNTHROID) 50 MCG TABLET    TAKE ONE TABLET BY MOUTH ONCE DAILY.   NAPROXEN SODIUM (ALEVE) 220 MG TABLET    Take 220 mg by mouth as needed.   TIZANIDINE (ZANAFLEX) 4 MG TABLET    Take 4 mg by mouth 2 (two) times daily as needed (migraine headaches.).   VITAMIN D, ERGOCALCIFEROL, (DRISDOL) 1.25 MG (50000 UNIT) CAPS CAPSULE    Take 1 capsule (50,000 Units total) by mouth once a week.   ZONISAMIDE (ZONEGRAN) 100 MG CAPSULE    Take 200 mg by mouth at bedtime.  Modified Medications   No medications on file  Discontinued Medications   ROMOSOZUMAB-AQQG (EVENITY) 105 MG/1. SOSY INJECTION    Inject 210 mg into the skin once.    Physical Exam:  Vitals:   09/27/22 1616  BP: 124/82  Pulse: 70  Temp: (!) 97.1 F (36.2 C)  TempSrc: Temporal  SpO2: 97%  Weight: 85.3 kg  Height: 5\' 8"  (1.727 m)   Body mass index is 28.59 kg/m. Wt Readings from Last 3 Encounters:  09/27/22 85.3 kg  03/23/22 82.6 kg  03/16/22 82.6 kg    Physical Exam Constitutional:      Appearance: Normal appearance.  Cardiovascular:      Rate and Rhythm: Normal rate and regular rhythm.     Pulses: Normal pulses.     Heart sounds: Normal heart sounds.  Pulmonary:     Effort: Pulmonary effort is normal.     Breath sounds: Normal breath sounds.  Abdominal:     General: Bowel sounds are normal.     Palpations: Abdomen is soft.  Skin:    General: Skin is warm and dry.  Neurological:     General: No focal deficit present.     Mental Status: She is alert and oriented to person, place, and time. Mental status is at baseline.  Psychiatric:        Mood and Affect: Mood normal.        Behavior: Behavior normal.  Labs reviewed: Basic Metabolic Panel: Recent Labs    02/22/22 1319 03/23/22 1137 09/14/22 1310  NA 136  --  138  K 3.7  --  3.9  CL 110  --  110  CO2 23  --  22  GLUCOSE 119*  --  83  BUN 16  --  13  CREATININE 0.85  --  0.77  CALCIUM 7.9*  --  7.9*  TSH  --  2.56  --    Liver Function Tests: Recent Labs    02/22/22 1319 09/14/22 1310  AST 15 17  ALT 14 17  ALKPHOS 104 70  BILITOT 0.1* 0.5  PROT 6.1* 6.2*  ALBUMIN 3.6 3.5   No results for input(s): "LIPASE", "AMYLASE" in the last 8760 hours. No results for input(s): "AMMONIA" in the last 8760 hours. CBC: Recent Labs    02/22/22 1319 09/14/22 1310  WBC 4.5 4.5  NEUTROABS 2.7 2.5  HGB 11.1* 11.3*  HCT 33.8* 35.0*  MCV 102.7* 102.6*  PLT 286 276   Lipid Panel: Recent Labs    03/23/22 1137  CHOL 161  HDL 59  LDLCALC 85  TRIG 84  CHOLHDL 2.7   TSH: Recent Labs    03/23/22 1137  TSH 2.56   A1C: Lab Results  Component Value Date   HGBA1C 5.5 03/23/2022     Assessment/Plan 1. Hypothyroidism due to acquired atrophy of thyroid -Stable -Continue levothyroxine 50 mcg daily  -No palpitations, weight loss notee  2. Age-related osteoporosis with current pathological fracture, sequela -Continue Prolia injections, Vit. D -Encouraged weight bearing exercise - DG Bone Density; Future- ordered for her to schedule after  02/09/2023  3. Type 2 diabetes mellitus without complication, without long-term current use of insulin (HCC) -Diet controlled -Encouraged continued dietary modifications and physical activity -Cut back on sugar intake Discussed getting diabetic eye exam  -Hemoglobin A1c  4. Hyperlipidemia, unspecified hyperlipidemia type -Diet controlled; levels WNL in March -Encouraged continued dietary modifications and physical activity  5. Major depression, single episode, in complete remission (HCC) -Stable on escitalopram/Lexapro -Seeing therapist every 2 weeks  6. Smoker -Still smoking; smoking cessation encouraged - Ambulatory Referral for Lung Cancer Scre  7. Malabsorption of iron -Follows with hematology -Last iron infusion about 6 months ago  8. B12 deficiency -Stable, Continue Vit. B12 injections -History of gastric bypass surgery  9. Vitamin D deficiency -Continue Vit. D supplementation  10. Low folate -Continue supplementation with folic acid daily   Return in about 6 months (around 03/28/2023) for routine follow up .   Rollen Sox, FNP-MSN Student -I personally was present during the history, physical exam and medical decision-making activities of this service and have verified that the service and findings are accurately documented in the student's note Abbey Chatters, NP

## 2022-09-29 LAB — LIPID PANEL
Cholesterol: 193 mg/dL (ref <200)
HDL: 68 mg/dL (ref >=50)
LDL Cholesterol (Calc): 106 mg/dL (calc) — ABNORMAL HIGH
Non-HDL Cholesterol (Calc): 125 mg/dL (calc) (ref <130)
Total CHOL/HDL Ratio: 2.8 (calc) (ref <5.0)
Triglycerides: 96 mg/dL (ref <150)

## 2022-09-29 LAB — HEMOGLOBIN A1C
Hgb A1c MFr Bld: 5.5 % of total Hgb (ref <5.7)
Mean Plasma Glucose: 111 mg/dL
eAG (mmol/L): 6.2 mmol/L

## 2022-10-19 DIAGNOSIS — G43719 Chronic migraine without aura, intractable, without status migrainosus: Secondary | ICD-10-CM | POA: Diagnosis not present

## 2022-10-19 DIAGNOSIS — M542 Cervicalgia: Secondary | ICD-10-CM | POA: Diagnosis not present

## 2022-10-19 DIAGNOSIS — G518 Other disorders of facial nerve: Secondary | ICD-10-CM | POA: Diagnosis not present

## 2022-10-19 DIAGNOSIS — M791 Myalgia, unspecified site: Secondary | ICD-10-CM | POA: Diagnosis not present

## 2022-11-05 ENCOUNTER — Other Ambulatory Visit: Payer: Self-pay

## 2022-11-05 ENCOUNTER — Encounter: Payer: Self-pay | Admitting: Nurse Practitioner

## 2022-11-05 ENCOUNTER — Ambulatory Visit (INDEPENDENT_AMBULATORY_CARE_PROVIDER_SITE_OTHER): Payer: Medicare Other | Admitting: Nurse Practitioner

## 2022-11-05 VITALS — BP 120/74 | HR 74 | Temp 98.2°F | Ht 68.0 in | Wt 193.2 lb

## 2022-11-05 DIAGNOSIS — R197 Diarrhea, unspecified: Secondary | ICD-10-CM

## 2022-11-05 MED ORDER — METRONIDAZOLE 500 MG PO TABS: 500.0000 mg | ORAL_TABLET | Freq: Three times a day (TID) | ORAL | 0 refills | Status: AC

## 2022-11-05 NOTE — Progress Notes (Signed)
Careteam: Patient Care Team: [name], NP as PCP - General (Geriatric Medicine) [name], DO as Consulting Physician (Geriatric Medicine) [name], MD as Consulting Physician (Hematology) Haverstock, Elvin So, MD as Referring Physician (Dermatology) Frazier, [place], OD (Optometry)  PLACE OF SERVICE:  [facility] CLINIC  Advanced Directive information    No Known Allergies  Chief Complaint  Patient presents with   Acute Visit    Chronic diarrhea      HPI: Patient is a 67 y.o. female presents for diarrhea.  Has chronic diarrhea due to hx of gastric bypass in 2004 but not like she is currently having.  However, for the last month, she has had very watery loose stools. This is different than her baseline, which is normally 1 BM per day.  She is now having some issues with bowel incontinence, at first it was nocturnal but now it is also happening during the day, it just "leaks out," doesn't matter what she eats/drinks.  Went to a conference about 1 month ago and ate some different foods but waited this long before coming in in case she had something viral going on.   Having approximately 12 stools per day.  2 weeks ago she started taking probiotics and OTC imodium which have not helped. Has not been drinking electrolyte replacements, just water. Has not tried adding fiber yet.   Stool is yellow, watery, looks oily.  Tries to avoid spicy foods, oily foods, sugars. No new foods, medications. No recent issues with constipation. Denies prior issues with gallbladder/pancreas.  Denies abdominal pain or fever. Denies sick contacts or recent antibiotic use. No recent travel outside of the [place].   Will be traveling to [place] in 2 weeks.  Had Covid booster (4 weeks ago), flu (3 weeks ago) and RSV (2 weeks ago) vaccines.  Review of Systems:  Review of Systems  Constitutional:  Negative for chills, fever and weight loss.  Cardiovascular:  Negative for  palpitations.  Gastrointestinal:  Positive for diarrhea. Negative for abdominal pain, blood in stool, constipation, nausea and vomiting.  Genitourinary:  Negative for dysuria, frequency and urgency.    Past Medical History:  Diagnosis Date   Anemia    Anxiety    Chronic back pain    Closed left ankle fracture 10/22/2020   in wheelchair   Dependence on wheelchair 10/27/2020   due to left ankle fracture 10-22-2020   History of COVID-19 09/05/2020   chest congestion, low grade fever all symptoms resolved per pt   Hypothyroidism    migraine    Skin abnormality 10/27/2020   scab on left knee and left toes healing, using splint on left leg   Vitamin B 12 deficiency    Vitamin D deficiency    Wears glasses    Past Surgical History:  Procedure Laterality Date   CESAREAN SECTION  1610,9604   COLONOSCOPY N/A 08/07/2013   Procedure: COLONOSCOPY;  Surgeon: West Bali, MD;  [facility];  Service: Endoscopy;  Laterality: N/A;  10:30-moved to [address] notified pt   ESOPHAGOGASTRODUODENOSCOPY     ESOPHAGOGASTRODUODENOSCOPY N/A 08/07/2013   Procedure: ESOPHAGOGASTRODUODENOSCOPY (EGD);  Surgeon: West Bali, MD;  [facility];  Service: Endoscopy;  Laterality: N/A;   FIBULA FRACTURE SURGERY Left 10/23/2020   GASTRIC BYPASS  01/08/2002   [place], [place]   ORIF ANKLE FRACTURE Left 11/02/2020   Procedure: OPEN REDUCTION INTERNAL FIXATION (ORIF) ANKLE FRACTURE AND SYNDESMOTIC FIXATION;  Surgeon: Netta Cedars,  MD;  Location: [facility] OR;  Service: Orthopedics;  Laterality: Left;  120   TONSILLECTOMY     Childhood   UPPER GI ENDOSCOPY  01/08/2002   Dr.Borhanian    Social History:   reports that she has been smoking cigarettes. She has a 21.5 pack-year smoking history. She has never used smokeless tobacco. She reports that she does not currently use drugs. She reports that she does not drink alcohol.  Family History  Problem Relation Age of Onset   Heart disease  Mother    Diabetes Mother    Stroke Mother    Hypertension Mother    Diabetes Father    Heart disease Father    Hypertension Father    Migraines Daughter    Colon cancer Neg Hx     Medications: Patient's Medications  New Prescriptions   METRONIDAZOLE (FLAGYL) 500 MG TABLET    Take 1 tablet (500 mg total) by mouth 3 (three) times daily for 7 days.  Previous Medications   ACETAMINOPHEN (TYLENOL) 500 MG TABLET    Take 500 mg by mouth every 6 (six) hours as needed for mild pain or headache.   CYANOCOBALAMIN (VITAMIN B12) 1000 MCG/ML INJECTION    INJECT INTO THE MUSCLE EVERY 21 DAYS.   DENOSUMAB (PROLIA) 60 MG/ML SOSY INJECTION    Inject 60 mg into the skin every 6 (six) months.   ESCITALOPRAM (LEXAPRO) 10 MG TABLET    TAKE ONE TABLET BY MOUTH ONCE DAILY.   FOLIC ACID (FOLVITE) 1 MG TABLET    TAKE (1) TABLET BY MOUTH ONCE DAILY.   LEVOTHYROXINE (SYNTHROID) 50 MCG TABLET    TAKE ONE TABLET BY MOUTH ONCE DAILY.   LOPERAMIDE (IMODIUM) 2 MG CAPSULE    Take 2 mg by mouth as needed for diarrhea or loose stools.   NAPROXEN SODIUM (ALEVE) 220 MG TABLET    Take 220 mg by mouth as needed.   PROBIOTIC PRODUCT (ALIGN PO)    Take 1 tablet by mouth daily.   TIZANIDINE (ZANAFLEX) 4 MG TABLET    Take 4 mg by mouth 2 (two) times daily as needed (migraine headaches.).   VITAMIN D, ERGOCALCIFEROL, (DRISDOL) 1.25 MG (50000 UNIT) CAPS CAPSULE    Take 1 capsule (50,000 Units total) by mouth once a week.   ZONISAMIDE (ZONEGRAN) 100 MG CAPSULE    Take 200 mg by mouth at bedtime.  Modified Medications   No medications on file  Discontinued Medications   No medications on file    Physical Exam:  Vitals:   11/05/22 0913  BP: 120/74  Pulse: 74  Temp: 98.2 F (36.8 C)  TempSrc: Temporal  SpO2: 99%  Weight: 87.6 kg  Height: 5\' 8"  (1.727 m)   Body mass index is 29.38 kg/m. Wt Readings from Last 3 Encounters:  11/05/22 193 lb 3.2 oz (87.6 kg)  09/27/22 188 lb (85.3 kg)  03/23/22 182 lb (82.6 kg)     Physical Exam Constitutional:      General: She is not in acute distress.    Appearance: Normal appearance. She is not ill-appearing, toxic-appearing or diaphoretic.  Cardiovascular:     Rate and Rhythm: Normal rate and regular rhythm.     Pulses: Normal pulses.     Heart sounds: Normal heart sounds.  Pulmonary:     Effort: Pulmonary effort is normal.     Breath sounds: Normal breath sounds.  Abdominal:     General: Bowel sounds are normal. There is no distension.  Palpations: Abdomen is soft.     Tenderness: There is no abdominal tenderness.  Skin:    General: Skin is warm and dry.     Coloration: Skin is not pale.  Neurological:     General: No focal deficit present.     Mental Status: She is alert and oriented to person, place, and time. Mental status is at baseline.  Psychiatric:        Mood and Affect: Mood normal.        Behavior: Behavior normal.        Thought Content: Thought content normal.     Labs reviewed: Basic Metabolic Panel: Recent Labs    02/22/22 1319 03/23/22 1137 09/14/22 1310  NA 136  --  138  K 3.7  --  3.9  CL 110  --  110  CO2 23  --  22  GLUCOSE 119*  --  83  BUN 16  --  13  CREATININE 0.85  --  0.77  CALCIUM 7.9*  --  7.9*  TSH  --  2.56  --    Liver Function Tests: Recent Labs    02/22/22 1319 09/14/22 1310  AST 15 17  ALT 14 17  ALKPHOS 104 70  BILITOT 0.1* 0.5  PROT 6.1* 6.2*  ALBUMIN 3.6 3.5   No results for input(s): "LIPASE", "AMYLASE" in the last 8760 hours. No results for input(s): "AMMONIA" in the last 8760 hours. CBC: Recent Labs    02/22/22 1319 09/14/22 1310  WBC 4.5 4.5  NEUTROABS 2.7 2.5  HGB 11.1* 11.3*  HCT 33.8* 35.0*  MCV 102.7* 102.6*  PLT 286 276   Lipid Panel: Recent Labs    03/23/22 1137 09/28/22 0953  CHOL 161 193  HDL 59 68  LDLCALC 85 106*  TRIG 84 96  CHOLHDL 2.7 2.8   TSH: Recent Labs    03/23/22 1137  TSH 2.56   A1C: Lab Results  Component Value Date   HGBA1C 5.5  09/28/2022     Assessment/Plan 1. Diarrhea, unspecified type -Continue probiotic daily -Can add Benefiber daily -Ensure adequate nutrition/hydration; avoid trigger foods - metroNIDAZOLE (FLAGYL) 500 MG tablet; Take 1 tablet (500 mg total) by mouth 3 (three) times daily for 7 days.  Dispense: 21 tablet; Refill: 0 -CBC, CMP -Lipase, amylase -C. Diff, ova and parasite, stool culture May benefit from creon if metronidazole not effective.  -to notify if symptoms do not improve or worsen.    Rollen Sox, FNP-MSN Student I personally was present during the history, physical exam and medical decision-making activities of this service and have verified that the service and findings are accurately documented in the student's note Abbey Chatters, NP

## 2022-11-05 NOTE — Patient Instructions (Addendum)
Continue probiotic twice daily  Can add fiber *benefiber* daily  Start metroNIDAZOLE (FLAGYL) 500 MG tablet three times daily for diarrhea (once you have collected stool sample)

## 2022-11-08 LAB — COMPREHENSIVE METABOLIC PANEL
AG Ratio: 1.5 (calc) (ref 1.0–2.5)
ALT: 7 U/L (ref 6–29)
AST: 11 U/L (ref 10–35)
Albumin: 3.7 g/dL (ref 3.6–5.1)
Alkaline phosphatase (APISO): 63 U/L (ref 37–153)
BUN: 12 mg/dL (ref 7–25)
CO2: 24 mmol/L (ref 20–32)
Calcium: 8.4 mg/dL — ABNORMAL LOW (ref 8.6–10.4)
Chloride: 113 mmol/L — ABNORMAL HIGH (ref 98–110)
Creat: 0.81 mg/dL (ref 0.50–1.05)
Globulin: 2.4 g/dL (ref 1.9–3.7)
Glucose, Bld: 88 mg/dL (ref 65–139)
Potassium: 4.4 mmol/L (ref 3.5–5.3)
Sodium: 141 mmol/L (ref 135–146)
Total Bilirubin: 0.3 mg/dL (ref 0.2–1.2)
Total Protein: 6.1 g/dL (ref 6.1–8.1)

## 2022-11-08 LAB — OVA AND PARASITE EXAMINATION
CONCENTRATE RESULT:: NONE SEEN
MICRO NUMBER:: 15652304
SPECIMEN QUALITY:: ADEQUATE
TRICHROME RESULT:: NONE SEEN

## 2022-11-08 LAB — CBC WITH DIFFERENTIAL/PLATELET
Absolute Lymphocytes: 1537 {cells}/uL (ref 850–3900)
Absolute Monocytes: 554 {cells}/uL (ref 200–950)
Basophils Absolute: 32 {cells}/uL (ref 0–200)
Basophils Relative: 0.5 %
Eosinophils Absolute: 38 {cells}/uL (ref 15–500)
Eosinophils Relative: 0.6 %
HCT: 38.3 % (ref 35.0–45.0)
Hemoglobin: 12.8 g/dL (ref 11.7–15.5)
MCH: 32.8 pg (ref 27.0–33.0)
MCHC: 33.4 g/dL (ref 32.0–36.0)
MCV: 98.2 fL (ref 80.0–100.0)
MPV: 9.9 fL (ref 7.5–12.5)
Monocytes Relative: 8.8 %
Neutro Abs: 4139 {cells}/uL (ref 1500–7800)
Neutrophils Relative %: 65.7 %
Platelets: 422 10*3/uL — ABNORMAL HIGH (ref 140–400)
RBC: 3.9 10*6/uL (ref 3.80–5.10)
RDW: 11.7 % (ref 11.0–15.0)
Total Lymphocyte: 24.4 %
WBC: 6.3 10*3/uL (ref 3.8–10.8)

## 2022-11-08 LAB — AMYLASE: Amylase: 17 U/L — ABNORMAL LOW (ref 21–101)

## 2022-11-08 LAB — CLOSTRIDIUM DIFFICILE TOXIN B, QUALITATIVE, REAL-TIME PCR: Toxigenic C. Difficile by PCR: NOT DETECTED

## 2022-11-08 LAB — LIPASE: Lipase: 16 U/L (ref 7–60)

## 2022-11-10 ENCOUNTER — Encounter: Payer: Self-pay | Admitting: Nurse Practitioner

## 2022-12-14 ENCOUNTER — Other Ambulatory Visit: Payer: Self-pay | Admitting: Hematology

## 2022-12-17 ENCOUNTER — Encounter: Payer: Self-pay | Admitting: Hematology

## 2022-12-20 DIAGNOSIS — M25512 Pain in left shoulder: Secondary | ICD-10-CM | POA: Diagnosis not present

## 2023-01-31 ENCOUNTER — Other Ambulatory Visit: Payer: Self-pay | Admitting: *Deleted

## 2023-01-31 DIAGNOSIS — M8000XS Age-related osteoporosis with current pathological fracture, unspecified site, sequela: Secondary | ICD-10-CM

## 2023-01-31 MED ORDER — DENOSUMAB 60 MG/ML ~~LOC~~ SOSY
60.0000 mg | PREFILLED_SYRINGE | Freq: Once | SUBCUTANEOUS | Status: AC
Start: 2023-01-31 — End: 2023-03-08
  Administered 2023-03-08: 60 mg via SUBCUTANEOUS

## 2023-02-13 ENCOUNTER — Other Ambulatory Visit: Payer: Self-pay | Admitting: Nurse Practitioner

## 2023-02-13 ENCOUNTER — Other Ambulatory Visit: Payer: Self-pay | Admitting: *Deleted

## 2023-02-13 DIAGNOSIS — E559 Vitamin D deficiency, unspecified: Secondary | ICD-10-CM

## 2023-02-13 MED ORDER — VITAMIN D (ERGOCALCIFEROL) 1.25 MG (50000 UNIT) PO CAPS: 50000.0000 [IU] | ORAL_CAPSULE | ORAL | 0 refills | Status: DC

## 2023-02-13 NOTE — Telephone Encounter (Signed)
Patient has request refill on medication that has High Risk Warnings. Medication pend and sent to PCP [name], NP .

## 2023-02-14 ENCOUNTER — Telehealth: Payer: Self-pay | Admitting: *Deleted

## 2023-02-14 DIAGNOSIS — D508 Other iron deficiency anemias: Secondary | ICD-10-CM

## 2023-02-14 DIAGNOSIS — M8000XS Age-related osteoporosis with current pathological fracture, unspecified site, sequela: Secondary | ICD-10-CM

## 2023-02-14 NOTE — Telephone Encounter (Signed)
 Submitted Prior Authorization through [facility] for Prolia  and it was APPROVED 02/14/2023-02/14/2024  Auth#: [id number]  Copy of Prior Auth sent to Scanning for chart.

## 2023-02-14 NOTE — Telephone Encounter (Addendum)
 Patient has an appointment on 03/08/2023 For Prolia  Injection and needs labs before her appointment. Her Last Lab on 11/05/2022, the calcium  was low at 8.4  Please Place Lab Order and then patient will need to be scheduled for a Lab appointment.

## 2023-02-22 DIAGNOSIS — H0100B Unspecified blepharitis left eye, upper and lower eyelids: Secondary | ICD-10-CM | POA: Diagnosis not present

## 2023-02-22 DIAGNOSIS — H0100A Unspecified blepharitis right eye, upper and lower eyelids: Secondary | ICD-10-CM | POA: Diagnosis not present

## 2023-02-22 DIAGNOSIS — H04123 Dry eye syndrome of bilateral lacrimal glands: Secondary | ICD-10-CM | POA: Diagnosis not present

## 2023-03-01 ENCOUNTER — Other Ambulatory Visit: Payer: Medicare Other

## 2023-03-01 DIAGNOSIS — M8000XS Age-related osteoporosis with current pathological fracture, unspecified site, sequela: Secondary | ICD-10-CM | POA: Diagnosis not present

## 2023-03-01 DIAGNOSIS — D508 Other iron deficiency anemias: Secondary | ICD-10-CM | POA: Diagnosis not present

## 2023-03-02 LAB — BASIC METABOLIC PANEL WITH GFR
BUN/Creatinine Ratio: 12 (calc) (ref 6–22)
BUN: 16 mg/dL (ref 7–25)
CO2: 24 mmol/L (ref 20–32)
Calcium: 9.1 mg/dL (ref 8.6–10.4)
Chloride: 108 mmol/L (ref 98–110)
Creat: 1.3 mg/dL — ABNORMAL HIGH (ref 0.50–1.05)
Glucose, Bld: 163 mg/dL — ABNORMAL HIGH (ref 65–139)
Potassium: 3.8 mmol/L (ref 3.5–5.3)
Sodium: 141 mmol/L (ref 135–146)
eGFR: 45 mL/min/{1.73_m2} — ABNORMAL LOW (ref >=60)

## 2023-03-02 LAB — CBC
HCT: 36.1 % (ref 35.0–45.0)
Hemoglobin: 12.1 g/dL (ref 11.7–15.5)
MCH: 33.3 pg — ABNORMAL HIGH (ref 27.0–33.0)
MCHC: 33.5 g/dL (ref 32.0–36.0)
MCV: 99.4 fL (ref 80.0–100.0)
MPV: 10.7 fL (ref 7.5–12.5)
Platelets: 302 10*3/uL (ref 140–400)
RBC: 3.63 10*6/uL — ABNORMAL LOW (ref 3.80–5.10)
RDW: 11.4 % (ref 11.0–15.0)
WBC: 5 10*3/uL (ref 3.8–10.8)

## 2023-03-08 ENCOUNTER — Ambulatory Visit: Payer: Medicare Other | Admitting: Nurse Practitioner

## 2023-03-08 DIAGNOSIS — M8000XS Age-related osteoporosis with current pathological fracture, unspecified site, sequela: Secondary | ICD-10-CM | POA: Diagnosis not present

## 2023-03-08 DIAGNOSIS — M791 Myalgia, unspecified site: Secondary | ICD-10-CM | POA: Diagnosis not present

## 2023-03-08 DIAGNOSIS — G518 Other disorders of facial nerve: Secondary | ICD-10-CM | POA: Diagnosis not present

## 2023-03-08 DIAGNOSIS — G43719 Chronic migraine without aura, intractable, without status migrainosus: Secondary | ICD-10-CM | POA: Diagnosis not present

## 2023-03-08 DIAGNOSIS — M542 Cervicalgia: Secondary | ICD-10-CM | POA: Diagnosis not present

## 2023-03-08 MED ORDER — DENOSUMAB 60 MG/ML ~~LOC~~ SOSY
60.0000 mg | PREFILLED_SYRINGE | SUBCUTANEOUS | Status: AC
  Administered 2023-09-13: 60 mg via SUBCUTANEOUS

## 2023-03-08 NOTE — Progress Notes (Signed)
 Prolia injection given today. Routed to PCP Janyth Contes Janene Harvey, NP to sign off.

## 2023-03-22 ENCOUNTER — Telehealth: Payer: Medicare Other | Admitting: Nurse Practitioner

## 2023-03-22 ENCOUNTER — Encounter: Payer: Self-pay | Admitting: Nurse Practitioner

## 2023-03-22 DIAGNOSIS — Z0000 Encounter for general adult medical examination without abnormal findings: Secondary | ICD-10-CM | POA: Diagnosis not present

## 2023-03-22 NOTE — Progress Notes (Addendum)
 This service is provided via telemedicine  No vital signs collected/recorded due to the encounter was a telemedicine visit.   Location of patient (ex: home, work):  Home   Patient consents to a telephone visit:  Yes, 03/16/2022   Location of the provider (ex: office, home):  [facility]  Name of any referring provider:  [name], NP   Names of all persons participating in the telemedicine service and their role in the encounter: Jermeka Schlotterbeck B/CMA, [name], NP , and patient  Time spent on call:  11 minutes

## 2023-03-22 NOTE — Progress Notes (Signed)
 Subjective:   April [name] is a 68 y.o. female who presents for Medicare Annual (Subsequent) preventive examination.  Visit Complete: Virtual I connected with  Nestor Lewandowsky on 03/22/23 by a video and audio enabled telemedicine application and verified that I am speaking with the correct person using two identifiers.  Patient Location: Home  Provider Location: Office/Clinic  I discussed the limitations of evaluation and management by telemedicine. The patient expressed understanding and agreed to proceed.  Vital Signs: Because this visit was a virtual/telehealth visit, some criteria may be missing or patient reported. Any vitals not documented were not able to be obtained and vitals that have been documented are patient reported.   Cardiac Risk Factors include: advanced age (>63men, >24 women);diabetes mellitus;smoking/ tobacco exposure;family history of premature cardiovascular disease     Objective:    There were no vitals filed for this visit. There is no height or weight on file to calculate BMI.     03/23/2022   10:54 AM 03/16/2022    8:26 AM 03/09/2022   12:21 PM 09/01/2021    9:07 AM 02/24/2021    9:22 AM 02/03/2021    4:02 PM 11/02/2020    1:29 PM  Advanced Directives  Does Patient Have a Medical Advance Directive? Yes No No No Yes Yes Yes  Type of Estate agent of [place];Living will    Healthcare Power of [place];Living will Healthcare Power of [place];Living will Healthcare Power of [place];Living will  Does patient want to make changes to medical advance directive? No - Patient declined    No - Patient declined No - Patient declined   Copy of Healthcare Power of Attorney in Chart? No - copy requested    No - copy requested No - copy requested   Would patient like information on creating a medical advance directive?   No - Patient declined No - Patient declined       Current Medications (verified) Outpatient Encounter Medications as of  03/22/2023  Medication Sig   acetaminophen (TYLENOL) 500 MG tablet Take 500 mg by mouth every 6 (six) hours as needed for mild pain or headache.   cyanocobalamin (VITAMIN B12) 1000 MCG/ML injection INJECT INTO THE MUSCLE EVERY 21 DAYS.   denosumab (PROLIA) 60 MG/ML SOSY injection Inject 60 mg into the skin every 6 (six) months.   escitalopram (LEXAPRO) 10 MG tablet TAKE ONE TABLET BY MOUTH ONCE DAILY.   folic acid (FOLVITE) 1 MG tablet TAKE (1) TABLET BY MOUTH ONCE DAILY.   levothyroxine (SYNTHROID) 50 MCG tablet TAKE ONE TABLET BY MOUTH ONCE DAILY.   naproxen sodium (ALEVE) 220 MG tablet Take 220 mg by mouth as needed.   Probiotic Product (ALIGN PO) Take 1 tablet by mouth daily.   tiZANidine (ZANAFLEX) 4 MG tablet Take 4 mg by mouth 2 (two) times daily as needed (migraine headaches.).   Vitamin D, Ergocalciferol, (DRISDOL) 1.25 MG (50000 UNIT) CAPS capsule Take 1 capsule (50,000 Units total) by mouth once a week.   zonisamide (ZONEGRAN) 100 MG capsule Take 200 mg by mouth at bedtime.   [DISCONTINUED] loperamide (IMODIUM) 2 MG capsule Take 2 mg by mouth as needed for diarrhea or loose stools. (Patient not taking: Reported on 03/22/2023)   Facility-Administered Encounter Medications as of 03/22/2023  Medication   [START ON 08/05/2023] denosumab (PROLIA) injection 60 mg    Allergies (verified) Patient has no known allergies.   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   due to left ankle fracture 10-22-2020   History of COVID-19 09/05/2020   chest congestion, low grade fever all symptoms resolved per pt   Hypothyroidism    migraine    Skin abnormality 10/27/2020   scab on left knee and left toes healing, using splint on left leg   Vitamin B 12 deficiency    Vitamin D deficiency    Wears glasses    Past Surgical History:  Procedure  Laterality Date   CESAREAN SECTION  [id number]   COLONOSCOPY N/A 08/07/2013   Procedure: COLONOSCOPY;  Surgeon: West Bali, MD;  Location: [facility];  Service: Endoscopy;  Laterality: N/A;  10:30-moved to [address] notified pt   ESOPHAGOGASTRODUODENOSCOPY     ESOPHAGOGASTRODUODENOSCOPY N/A 08/07/2013   Procedure: ESOPHAGOGASTRODUODENOSCOPY (EGD);  Surgeon: West Bali, MD;  Location: [facility];  Service: Endoscopy;  Laterality: N/A;   FIBULA FRACTURE SURGERY Left 10/23/2020   GASTRIC BYPASS  01/08/2002   [place], [place]   ORIF ANKLE FRACTURE Left 11/02/2020   Procedure: OPEN REDUCTION INTERNAL FIXATION (ORIF) ANKLE FRACTURE AND SYNDESMOTIC FIXATION;  Surgeon: Netta Cedars, MD;  Location: [facility];  Service: Orthopedics;  Laterality: Left;  120   TONSILLECTOMY     Childhood   UPPER GI ENDOSCOPY  01/08/2002   Dr.Borhanian    Family History  Problem Relation Age of Onset   Heart disease Mother    Diabetes Mother    Stroke Mother    Hypertension Mother    Diabetes Father    Heart disease Father    Hypertension Father    Migraines Daughter    Colon cancer Neg Hx    Social History   Socioeconomic History   Marital status: Married    Spouse name: Not on file   Number of children: Not on file   Years of education: Not on file   Highest education level: Not on file  Occupational History   Occupation: Dentist    Employer: [employer]  Tobacco Use   Smoking status: Every Day    Current packs/day: 0.50    Average packs/day: 0.5 packs/day for 43.0 years (21.5 ttl pk-yrs)    Types: Cigarettes   Smokeless tobacco: Never   Tobacco comments:    Relasped  Vaping Use   Vaping status: Never Used  Substance and Sexual Activity   Alcohol use: No    Alcohol/week: 0.0 standard drinks of alcohol   Drug use: Not Currently    Comment: hx of  prescrition narcotic dependence sober date jan 21st 2014   Sexual activity: Yes  Other Topics Concern   Not on file   Social History Narrative   Married, 1990, has 2 children. Patient lives in a multi-level home. Previously smoked 1/2 PPD, smoked for 10-20 years. Drinks a minimal amount of caffeinated beverages.    Social Drivers of Corporate investment banker Strain: Not on file  Food Insecurity: Not on file  Transportation Needs: Not on file  Physical Activity: Not on file  Stress: Not on file  Social Connections: Not on file    Tobacco Counseling Ready to quit: Not Answered Counseling given: Not Answered Tobacco comments: Relasped   Clinical Intake:  Pre-visit preparation completed: Yes  Pain : No/denies pain     BMI - recorded: 29 Nutritional Status: BMI 25 -29  Overweight Nutritional Risks: Other (Comment) (gastric bypass)  How often do you need to have someone help you when you read instructions, pamphlets, or other written materials from your doctor or pharmacy?: 1 - Never         Activities of Daily Living    03/22/2023    8:33 AM  In your present state of health, do you have any difficulty performing the following activities:  Hearing? 0  Vision? 1  Comment when eyes are dry  Difficulty concentrating or making decisions? 0  Walking or climbing stairs? 0  Dressing or bathing? 0  Doing errands, shopping? 0  Preparing Food and eating ? N  Using the Toilet? N  In the past six months, have you accidently leaked urine? N  Do you have problems with loss of bowel control? N  Managing your Medications? N  Managing your Finances? N  Housekeeping or managing your Housekeeping? N    Patient Care Team: [name], NP as PCP - General (Geriatric Medicine) [name], DO as Consulting Physician (Geriatric Medicine) [name], MD as Consulting Physician (Hematology) [place], Elvin So, MD as Referring Physician (Dermatology) Bascom Levels, [place], OD (Optometry)  Indicate any recent Medical Services you may have received from other than [facility] providers  in the past year (date may be approximate).     Assessment:   This is a routine wellness examination for Marieanne.  Hearing/Vision screen Hearing Screening - Comments:: Patient has no problem with hearing Vision Screening - Comments:: Patient has no problem with vision   Goals Addressed   None    Depression Screen    03/22/2023    8:16 AM 03/22/2023    8:15 AM 09/28/2022    9:11 AM 03/23/2022   10:54 AM 03/16/2022    8:28 AM 02/24/2021    9:22 AM 02/03/2021    4:00 PM  PHQ 2/9 Scores  PHQ - 2 Score 0 0   0 0 0  PHQ- 9 Score 0        Exception Documentation   Other- indicate reason in comment box Other- indicate reason in comment box  Other- indicate reason in comment box   Not completed   Seeing a therapist Under the care of [facility]rpasit, seen every quarter  Seeing a therapist     Fall Risk    03/22/2023    8:16 AM 11/05/2022    9:15 AM 09/28/2022    9:11 AM 03/23/2022   10:54 AM 03/16/2022    8:28 AM  Fall Risk   Falls in the past year? 0 0 0 0 0  Number falls in past yr: 0 0 0 0   Injury with Fall? 0 0 0 0   Risk for fall due to : No Fall Risks No Fall Risks No Fall Risks No Fall Risks   Follow up Falls evaluation completed Falls evaluation completed Falls evaluation completed Falls evaluation completed     MEDICARE RISK AT HOME:    TIMED UP AND GO:  Was the test performed? no   Cognitive Function:    02/03/2021    4:04 PM  MMSE - Mini Mental State Exam  Orientation to time 5  Orientation to Place 5  Registration 3  Attention/ Calculation 5  Recall 3  Language- name 2 objects 2  Language- repeat 1  Language- follow 3 step command 3  Language- read & follow direction 1  Write a sentence 1  Copy design 1  Total score 30  03/22/2023    8:16 AM  6CIT Screen  What Year? 0 points  What month? 0 points  What time? 0 points  Count back from 20 0 points  Months in reverse 0 points  Repeat phrase 0 points  Total Score 0 points     Immunizations Immunization History  Administered Date(s) Administered   Fluad Quad(high Dose 65+) 09/18/2019, 02/03/2021   Influenza Inj Mdck Quad Pf 10/16/2016   Influenza,inj,Quad PF,6+ Mos 11/21/2015, 08/29/2018   Influenza-Unspecified 10/08/2012, 10/09/2014, 09/06/2017   Moderna Sars-Covid-2 Vaccination 01/17/2019, 02/14/2019, 12/25/2019   PNEUMOCOCCAL CONJUGATE-20 03/23/2022   Pneumococcal Conjugate-13 11/21/2015   Pneumococcal Polysaccharide-23 11/02/2016   Tdap 11/23/2014    TDAP status: Up to date  Flu Vaccine status: Up to date  Pneumococcal vaccine status: Up to date  Covid-19 vaccine status: Information provided on how to obtain vaccines.   Qualifies for Shingles Vaccine? Yes   Zostavax completed No   Shingrix Completed?: No.    Education has been provided regarding the importance of this vaccine. Patient has been advised to call insurance company to determine out of pocket expense if they have not yet received this vaccine. Advised may also receive vaccine at local pharmacy or Health Dept. Verbalized acceptance and understanding.  Screening Tests Health Maintenance  Topic Date Due   Lung Cancer Screening  Never done   Zoster Vaccines- Shingrix (1 of 2) Never done   OPHTHALMOLOGY EXAM  06/21/2020   COVID-19 Vaccine (4 - 2024-25 season) 09/09/2022   DEXA SCAN  02/09/2023   Diabetic kidney evaluation - Urine ACR  03/23/2023   FOOT EXAM  03/23/2023   HEMOGLOBIN A1C  03/28/2023   Colonoscopy  08/08/2023   Diabetic kidney evaluation - eGFR measurement  02/29/2024   Medicare Annual Wellness (AWV)  03/21/2024   MAMMOGRAM  08/09/2024   DTaP/Tdap/Td (2 - Td or Tdap) 11/22/2024   Pneumonia Vaccine 15+ Years old  Completed   Hepatitis C Screening  Completed   HPV VACCINES  Aged Out    Health Maintenance  Health Maintenance Due  Topic Date Due   Lung Cancer Screening  Never done   Zoster Vaccines- Shingrix (1 of 2) Never done   OPHTHALMOLOGY EXAM  06/21/2020    COVID-19 Vaccine (4 - 2024-25 season) 09/09/2022   DEXA SCAN  02/09/2023   Diabetic kidney evaluation - Urine ACR  03/23/2023    Colorectal cancer screening: Type of screening: Colonoscopy. Completed 2015. Repeat every 10 years  Mammogram status: Completed 08/2022. Repeat every year  Bone Density status: Ordered 09/2022. Pt provided with contact info and advised to call to schedule appt.  Lung Cancer Screening: (Low Dose CT Chest recommended if Age 18-80 years, 20 pack-year currently smoking OR have quit w/in 15years.) does qualify.   Lung Cancer Screening Referral: done, she has to make appt  Additional Screening:  Hepatitis C Screening: does qualify; Completed   Vision Screening: Recommended annual ophthalmology exams for early detection of glaucoma and other disorders of the eye. Is the patient up to date with their annual eye exam?  Yes  Who is the provider or what is the name of the office in which the patient attends annual eye exams? [facility] If pt is not established with a provider, would they like to be referred to a provider to establish care? No .   Dental Screening: Recommended annual dental exams for proper oral hygiene  Diabetic Foot Exam: Diabetic Foot Exam: Completed 03/22/2022  Community Resource Referral / Chronic Care Management:  CRR required this visit?  No   CCM required this visit?  No     Plan:     I have personally reviewed and noted the following in the patient's chart:   Medical and social history Use of alcohol, tobacco or illicit drugs  Current medications and supplements including opioid prescriptions. Patient is not currently taking opioid prescriptions. Functional ability and status Nutritional status Physical activity Advanced directives List of other physicians Hospitalizations, surgeries, and ER visits in previous 12 months Vitals Screenings to include cognitive, depression, and falls Referrals and appointments  In addition, I have  reviewed and discussed with patient certain preventive protocols, quality metrics, and best practice recommendations. A written personalized care plan for preventive services as well as general preventive health recommendations were provided to patient.     [name], NP   03/22/2023   After Visit Summary: (MyChart) Due to this being a telephonic visit, the after visit summary with patients personalized plan was offered to patient via MyChart

## 2023-03-22 NOTE — Patient Instructions (Addendum)
  April [name] , Thank you for taking time to come for your Medicare Wellness Visit. I appreciate your ongoing commitment to your health goals. Please review the following plan we discussed and let me know if I can assist you in the future.   Call to schedule bone density  To get shingles vaccines at local pharmacy   To call pulmonary and make appt for lung cancer screening.    This is a list of the screening recommended for you and due dates:  Health Maintenance  Topic Date Due   Screening for Lung Cancer  Never done   Zoster (Shingles) Vaccine (1 of 2) Never done   Eye exam for diabetics  06/21/2020   COVID-19 Vaccine (4 - 2024-25 season) 09/09/2022   DEXA scan (bone density measurement)  02/09/2023   Yearly kidney health urinalysis for diabetes  03/23/2023   Complete foot exam   03/23/2023   Hemoglobin A1C  03/28/2023   Colon Cancer Screening  08/08/2023   Yearly kidney function blood test for diabetes  02/29/2024   Medicare Annual Wellness Visit  03/21/2024   Mammogram  08/09/2024   DTaP/Tdap/Td vaccine (2 - Td or Tdap) 11/22/2024   Pneumonia Vaccine  Completed   Hepatitis C Screening  Completed   HPV Vaccine  Aged Out

## 2023-03-29 ENCOUNTER — Encounter: Payer: Self-pay | Admitting: Nurse Practitioner

## 2023-03-29 ENCOUNTER — Ambulatory Visit (INDEPENDENT_AMBULATORY_CARE_PROVIDER_SITE_OTHER): Payer: Medicare Other | Admitting: Nurse Practitioner

## 2023-03-29 VITALS — BP 112/70 | HR 66 | Temp 97.1°F | Ht 68.0 in | Wt 182.8 lb

## 2023-03-29 DIAGNOSIS — E559 Vitamin D deficiency, unspecified: Secondary | ICD-10-CM

## 2023-03-29 DIAGNOSIS — E785 Hyperlipidemia, unspecified: Secondary | ICD-10-CM | POA: Diagnosis not present

## 2023-03-29 DIAGNOSIS — E538 Deficiency of other specified B group vitamins: Secondary | ICD-10-CM

## 2023-03-29 DIAGNOSIS — H0100A Unspecified blepharitis right eye, upper and lower eyelids: Secondary | ICD-10-CM | POA: Diagnosis not present

## 2023-03-29 DIAGNOSIS — F325 Major depressive disorder, single episode, in full remission: Secondary | ICD-10-CM

## 2023-03-29 DIAGNOSIS — D508 Other iron deficiency anemias: Secondary | ICD-10-CM

## 2023-03-29 DIAGNOSIS — E034 Atrophy of thyroid (acquired): Secondary | ICD-10-CM

## 2023-03-29 DIAGNOSIS — E1169 Type 2 diabetes mellitus with other specified complication: Secondary | ICD-10-CM | POA: Diagnosis not present

## 2023-03-29 DIAGNOSIS — M8000XS Age-related osteoporosis with current pathological fracture, unspecified site, sequela: Secondary | ICD-10-CM | POA: Diagnosis not present

## 2023-03-29 DIAGNOSIS — F17200 Nicotine dependence, unspecified, uncomplicated: Secondary | ICD-10-CM | POA: Diagnosis not present

## 2023-03-29 DIAGNOSIS — N289 Disorder of kidney and ureter, unspecified: Secondary | ICD-10-CM

## 2023-03-29 DIAGNOSIS — E119 Type 2 diabetes mellitus without complications: Secondary | ICD-10-CM | POA: Diagnosis not present

## 2023-03-29 DIAGNOSIS — H04123 Dry eye syndrome of bilateral lacrimal glands: Secondary | ICD-10-CM | POA: Diagnosis not present

## 2023-03-29 DIAGNOSIS — H0100B Unspecified blepharitis left eye, upper and lower eyelids: Secondary | ICD-10-CM | POA: Diagnosis not present

## 2023-03-29 NOTE — Patient Instructions (Addendum)
 proper hydration and to avoid NSAIDS (Aleve, Advil, Motrin, Ibuprofen)   To get MMR booster at your local pharmacy   Can take tylenol 1000 mg by mouth every 8 hours as needed for pain

## 2023-03-29 NOTE — Progress Notes (Signed)
 Careteam: Patient Care Team: [name], NP as PCP - General (Geriatric Medicine) [name], DO as Consulting Physician (Geriatric Medicine) [name], MD as Consulting Physician (Hematology) Haverstock, Elvin So, MD as Referring Physician (Dermatology) Frazier, [place], OD (Optometry)   PLACE OF SERVICE: [facility]  Advanced Directive information     No Known Allergies   Chief Complaint  Patient presents with   Medical Management of Chronic Issues    6 month follow-up. Foot exam, discussed need for diabetic kidney evaluation, A1c, eye exam, shingrix, and covid boosters. Left foot numbness (injured ankle area about 2 years ago).       HPI: Patient is a 68 y.o. female presents for 79-month follow-up.   Reports having ongoing left foot numbness after ankle fracture 2 years ago, has had this issue since the injury but thinks it's getting worse and notes it on right foot as well. Has treatment with acupuncture. Denies falls, no trouble with gait.   Hypothyroid- compliant with medication, on levothyroxine.   Osteoporosis; has bone scan next Tuesday. Did Evenity for 1 year, started prolia recently; walking 3x/week  DM: had gastric bypass sx about 17 years ago, not on medication since. Eye exam coming up  Mood is fine, taking lexapro; seeing therapy  Upper body pain due to body posture at work (works 4 days/week)- meloxicam as needed after evening meal and occassional muscle relaxants  Migraines: receives injections through neurology; not many migraines a month   Patient states she is still smoking cigarettes, about 1/2 pack/day.  Still needs to schedule low-dose CT scan.   Review of Systems:  Review of Systems  Constitutional:  Negative for chills, fever, malaise/fatigue and weight loss.  Respiratory:  Negative for cough, shortness of breath and wheezing.   Cardiovascular:  Negative for chest pain, palpitations and leg swelling.   Gastrointestinal:  Negative for abdominal pain, blood in stool, constipation, diarrhea, nausea and vomiting.  Genitourinary:  Negative for dysuria, frequency, hematuria and urgency.  Musculoskeletal:  Positive for back pain (upper) and myalgias.  Neurological:  Positive for headaches. Negative for dizziness and weakness.  Psychiatric/Behavioral:  Positive for depression (controlled). The patient is not nervous/anxious and does not have insomnia.     Past Medical History:  Diagnosis Date   Anemia    Anxiety    Chronic back pain    Closed left ankle fracture 10/22/2020   in wheelchair   Dependence on wheelchair 10/27/2020   due to left ankle fracture 10-22-2020   History of COVID-19 09/05/2020   chest congestion, low grade fever all symptoms resolved per pt   Hypothyroidism    migraine    Skin abnormality 10/27/2020   scab on left knee and left toes healing, using splint on left leg   Vitamin B 12 deficiency    Vitamin D deficiency    Wears glasses     Past Surgical History:  Procedure Laterality Date   CESAREAN SECTION  8756,4332   COLONOSCOPY N/A 08/07/2013   Procedure: COLONOSCOPY;  Surgeon: West Bali, MD;  [facility];  Service: Endoscopy;  Laterality: N/A;  10:30-moved to [address] notified pt   ESOPHAGOGASTRODUODENOSCOPY     ESOPHAGOGASTRODUODENOSCOPY N/A 08/07/2013   Procedure: ESOPHAGOGASTRODUODENOSCOPY (EGD);  Surgeon: West Bali, MD;  Location: [facility];  Service: Endoscopy;  Laterality: N/A;   FIBULA FRACTURE SURGERY Left 10/23/2020   GASTRIC BYPASS  01/08/2002   [place], [place]   ORIF ANKLE FRACTURE Left  11/02/2020   Procedure: OPEN REDUCTION INTERNAL FIXATION (ORIF) ANKLE FRACTURE AND SYNDESMOTIC FIXATION;  Surgeon: Netta Cedars, MD;  Location: [facility] OR;  Service: Orthopedics;  Laterality: Left;  120   TONSILLECTOMY     Childhood   UPPER GI ENDOSCOPY  01/08/2002   Dr.Borhanian      Social History:   reports that she has been  smoking cigarettes. She has a 21.5 pack-year smoking history. She has never used smokeless tobacco. She reports that she does not currently use drugs. She reports that she does not drink alcohol.  Family History  Problem Relation Age of Onset   Heart disease Mother    Diabetes Mother    Stroke Mother    Hypertension Mother    Diabetes Father    Heart disease Father    Hypertension Father    Migraines Daughter    Colon cancer Neg Hx      Medications:  Patient's Medications  New Prescriptions   No medications on file  Previous Medications   ACETAMINOPHEN (TYLENOL) 500 MG TABLET    Take 500 mg by mouth every 6 (six) hours as needed for mild pain or headache.   CYANOCOBALAMIN (VITAMIN B12) 1000 MCG/ML INJECTION    INJECT INTO THE MUSCLE EVERY 21 DAYS.   DENOSUMAB (PROLIA) 60 MG/ML SOSY INJECTION    Inject 60 mg into the skin every 6 (six) months.   ESCITALOPRAM (LEXAPRO) 10 MG TABLET    TAKE ONE TABLET BY MOUTH ONCE DAILY.   FEXOFENADINE (ALLEGRA) 180 MG TABLET    Take 180 mg by mouth daily.   FOLIC ACID (FOLVITE) 1 MG TABLET    TAKE (1) TABLET BY MOUTH ONCE DAILY.   LEVOTHYROXINE (SYNTHROID) 50 MCG TABLET    TAKE ONE TABLET BY MOUTH ONCE DAILY.   MELOXICAM (MOBIC) 7.5 MG TABLET    Take 7.5 mg by mouth as needed.   NAPROXEN SODIUM (ALEVE) 220 MG TABLET    Take 220 mg by mouth as needed.   PROBIOTIC PRODUCT (ALIGN PO)    Take 1 tablet by mouth daily.   TIZANIDINE (ZANAFLEX) 4 MG TABLET    Take 4 mg by mouth 2 (two) times daily as needed (migraine headaches.).   VITAMIN D, ERGOCALCIFEROL, (DRISDOL) 1.25 MG (50000 UNIT) CAPS CAPSULE    Take 1 capsule (50,000 Units total) by mouth once a week.   ZONISAMIDE (ZONEGRAN) 100 MG CAPSULE    Take 200 mg by mouth at bedtime.  Modified Medications   No medications on file  Discontinued Medications   No medications on file     Physical Exam:  Vitals:   03/29/23 0730  BP: 112/70  Pulse: 66  Temp: (!) 97.1 F (36.2 C)  Weight: 182 lb  12.8 oz (82.9 kg)  Height: 5\' 8"  (1.727 m)   Body mass index is 27.79 kg/m.  Wt Readings from Last 3 Encounters:  03/29/23 182 lb 12.8 oz (82.9 kg)  11/05/22 193 lb 3.2 oz (87.6 kg)  09/27/22 188 lb (85.3 kg)     Physical Exam Constitutional:      Appearance: Normal appearance.  Cardiovascular:     Rate and Rhythm: Normal rate and regular rhythm.     Pulses: Normal pulses.     Heart sounds: Normal heart sounds.  Pulmonary:     Effort: Pulmonary effort is normal.     Breath sounds: Normal breath sounds.  Abdominal:     General: Bowel sounds are normal.     Palpations:  Abdomen is soft.  Musculoskeletal:        General: Normal range of motion.  Skin:    General: Skin is warm and dry.     Comments: Left foot venous discoloration; callus on left lateral foot  Neurological:     General: No focal deficit present.     Mental Status: She is alert and oriented to person, place, and time. Mental status is at baseline.     Sensory: Sensory deficit (slightly on left upper lateral foot) present.     Motor: No weakness.  Psychiatric:        Mood and Affect: Mood normal.        Behavior: Behavior normal.     Labs reviewed: Basic Metabolic Panel:  Recent Labs    09/14/22 1310 11/05/22 0958 03/01/23 1333  NA 138 141 141  K 3.9 4.4 3.8  CL 110 113* 108  CO2 22 24 24   GLUCOSE 83 88 163*  BUN 13 12 16   CREATININE 0.77 0.81 1.30*  CALCIUM 7.9* 8.4* 9.1   Liver Function Tests:  Recent Labs    09/14/22 1310 11/05/22 0958  AST 17 11  ALT 17 7  ALKPHOS 70  --   BILITOT 0.5 0.3  PROT 6.2* 6.1  ALBUMIN 3.5  --     Recent Labs    11/05/22 0958  LIPASE 16  AMYLASE 17*   No results for input(s): "AMMONIA" in the last 8760 hours. CBC:  Recent Labs    09/14/22 1310 11/05/22 0958 03/01/23 1333  WBC 4.5 6.3 5.0  NEUTROABS 2.5 4,139  --   HGB 11.3* 12.8 12.1  HCT 35.0* 38.3 36.1  MCV 102.6* 98.2 99.4  PLT 276 422* 302   Lipid Panel:  Recent Labs    09/28/22 0953   CHOL 193  HDL 68  LDLCALC 106*  TRIG 96  CHOLHDL 2.8   TSH: No results for input(s): "TSH" in the last 8760 hours. A1C:  Lab Results  Component Value Date   HGBA1C 5.5 09/28/2022     Assessment/Plan   1. Type 2 diabetes mellitus with complication, without long-term current use of insulin (HCC) - Microalbumin/Creatinine Ratio, Urine - Hemoglobin A1c -Not on medication, diet controlled -Encouraged dietary modifications and physical activity as tolerated  -Diabetic foot exam; encouraged routine foot care, eye exams  2. Hyperlipidemia, unspecified hyperlipidemia type -Not on medication, diet controlled -Encouraged dietary modifications and physical activity as tolerated   3. Hypothyroidism due to acquired atrophy of thyroid -Stable, continue levothyroxine daily - TSH  4. Smoker -Encouraged smoking cessation -Schedule low-dose CT scan  5. Age-related osteoporosis with current pathological fracture, sequela (Primary) -Continue Prolia injections, Drisdol -Encouraged smoking cessation and weight-bearing exercises  6. Other iron deficiency anemia -Continue supplementation -Continue care with oncology/hematology  7. Renal insufficiency -Cr elevated from baseline on last labs, will recheck -Encouraged proper nutrition/hydration -Avoid nephrotoxic medications; decrease use of meloxicam, may use tylenol instead for pain  - Complete Metabolic Panel with eGFR  8. B12 deficiency -Continue supplementation  9. Low folate -Continue supplementation -Continue care with oncology/hematology  10. Vitamin D deficiency -Continue supplementation  11. Major depression, single episode, in complete remission (HCC) -Mood stable, continue Lexapro -In process of finding a new therapist, plans to continue therapy  Return in about 6 months (around 09/29/2023) for routine follow up, labs at time of visit.  Rollen Sox, Haroldine Laws MSN-FNP Student -I personally was present  during the history, physical exam and medical decision-making  activities of this service and have verified that the service and findings are accurately documented in the student's note [place] K. Biagio Borg [facility] [phone]

## 2023-03-30 LAB — MICROALBUMIN / CREATININE URINE RATIO
Creatinine, Urine: 79 mg/dL (ref 20–275)
Microalb, Ur: <0.2 mg/dL

## 2023-03-30 LAB — HEMOGLOBIN A1C
Hgb A1c MFr Bld: 5.5 %{Hb} (ref <5.7)
Mean Plasma Glucose: 111 mg/dL
eAG (mmol/L): 6.2 mmol/L

## 2023-03-30 LAB — COMPLETE METABOLIC PANEL WITH GFR
AG Ratio: 1.9 (calc) (ref 1.0–2.5)
ALT: 17 U/L (ref 6–29)
AST: 16 U/L (ref 10–35)
Albumin: 3.7 g/dL (ref 3.6–5.1)
Alkaline phosphatase (APISO): 53 U/L (ref 37–153)
BUN: 18 mg/dL (ref 7–25)
CO2: 26 mmol/L (ref 20–32)
Calcium: 8.5 mg/dL — ABNORMAL LOW (ref 8.6–10.4)
Chloride: 110 mmol/L (ref 98–110)
Creat: 0.86 mg/dL (ref 0.50–1.05)
Globulin: 2 g/dL (ref 1.9–3.7)
Glucose, Bld: 85 mg/dL (ref 65–99)
Potassium: 4.7 mmol/L (ref 3.5–5.3)
Sodium: 143 mmol/L (ref 135–146)
Total Bilirubin: 0.3 mg/dL (ref 0.2–1.2)
Total Protein: 5.7 g/dL — ABNORMAL LOW (ref 6.1–8.1)

## 2023-03-30 LAB — TSH: TSH: 3.25 m[IU]/L (ref 0.40–4.50)

## 2023-04-01 ENCOUNTER — Encounter: Payer: Self-pay | Admitting: Nurse Practitioner

## 2023-04-01 MED ORDER — CALCIUM CARBONATE 600 MG PO TABS: 600.0000 mg | ORAL_TABLET | Freq: Two times a day (BID) | ORAL | Status: AC

## 2023-04-01 NOTE — Telephone Encounter (Signed)
 Calcium added to active medication list

## 2023-04-01 NOTE — Telephone Encounter (Signed)
Message routed to PCP Eubanks, Jessica K, NP  

## 2023-04-02 ENCOUNTER — Encounter: Payer: Self-pay | Admitting: Nurse Practitioner

## 2023-04-02 ENCOUNTER — Ambulatory Visit (HOSPITAL_COMMUNITY)
Admission: RE | Admit: 2023-04-02 | Discharge: 2023-04-02 | Disposition: A | Discharge status: Home / Self Care | Source: Ambulatory Visit | Attending: Nurse Practitioner | Admitting: Nurse Practitioner

## 2023-04-02 DIAGNOSIS — Z780 Asymptomatic menopausal state: Secondary | ICD-10-CM | POA: Diagnosis not present

## 2023-04-02 DIAGNOSIS — M8000XS Age-related osteoporosis with current pathological fracture, unspecified site, sequela: Secondary | ICD-10-CM | POA: Insufficient documentation

## 2023-04-02 DIAGNOSIS — M810 Age-related osteoporosis without current pathological fracture: Secondary | ICD-10-CM | POA: Diagnosis not present

## 2023-04-02 DIAGNOSIS — Z87310 Personal history of (healed) osteoporosis fracture: Secondary | ICD-10-CM | POA: Insufficient documentation

## 2023-04-03 ENCOUNTER — Other Ambulatory Visit: Payer: Self-pay | Admitting: Nurse Practitioner

## 2023-04-12 ENCOUNTER — Telehealth: Payer: Self-pay | Admitting: Acute Care

## 2023-04-12 NOTE — Telephone Encounter (Signed)
 Returned call. LVM to call office and discuss the LCS program.

## 2023-04-12 NOTE — Telephone Encounter (Signed)
 Patient was referred to [facility] department and would like a call to get scheduled. She cn be reached at [phone]. She prefers to be called on a Friday.

## 2023-06-07 DIAGNOSIS — M791 Myalgia, unspecified site: Secondary | ICD-10-CM | POA: Diagnosis not present

## 2023-06-07 DIAGNOSIS — G518 Other disorders of facial nerve: Secondary | ICD-10-CM | POA: Diagnosis not present

## 2023-06-07 DIAGNOSIS — G43719 Chronic migraine without aura, intractable, without status migrainosus: Secondary | ICD-10-CM | POA: Diagnosis not present

## 2023-06-07 DIAGNOSIS — M542 Cervicalgia: Secondary | ICD-10-CM | POA: Diagnosis not present

## 2023-07-18 ENCOUNTER — Encounter (INDEPENDENT_AMBULATORY_CARE_PROVIDER_SITE_OTHER): Payer: Self-pay | Admitting: *Deleted

## 2023-07-30 ENCOUNTER — Encounter: Payer: Self-pay | Admitting: Nurse Practitioner

## 2023-08-06 ENCOUNTER — Telehealth: Admitting: *Deleted

## 2023-08-06 NOTE — Telephone Encounter (Signed)
 Received [employer] Verification for Prolia  and Copay 20% + $20 Admin Fee $341 [place] APPROVED 02/14/2023-02/14/2024 Auth#: [id number]

## 2023-08-13 ENCOUNTER — Encounter: Payer: Self-pay | Admitting: Nurse Practitioner

## 2023-08-13 DIAGNOSIS — M810 Age-related osteoporosis without current pathological fracture: Secondary | ICD-10-CM | POA: Insufficient documentation

## 2023-08-17 ENCOUNTER — Other Ambulatory Visit: Payer: Self-pay | Admitting: Nurse Practitioner

## 2023-08-23 IMAGING — MG MM DIGITAL SCREENING BILAT W/ TOMO AND CAD
6 of 10 series · 6 of 30 positions shown · non-contrast
Comparison: Previous exam(s).

CLINICAL DATA: Screening.

EXAM:
DIGITAL SCREENING BILATERAL MAMMOGRAM WITH TOMOSYNTHESIS AND CAD
TECHNIQUE: Bilateral screening digital craniocaudal and mediolateral oblique
mammograms were obtained. Bilateral screening digital breast
tomosynthesis was performed. The images were evaluated with
computer-aided detection.

[L CC synth-2D]
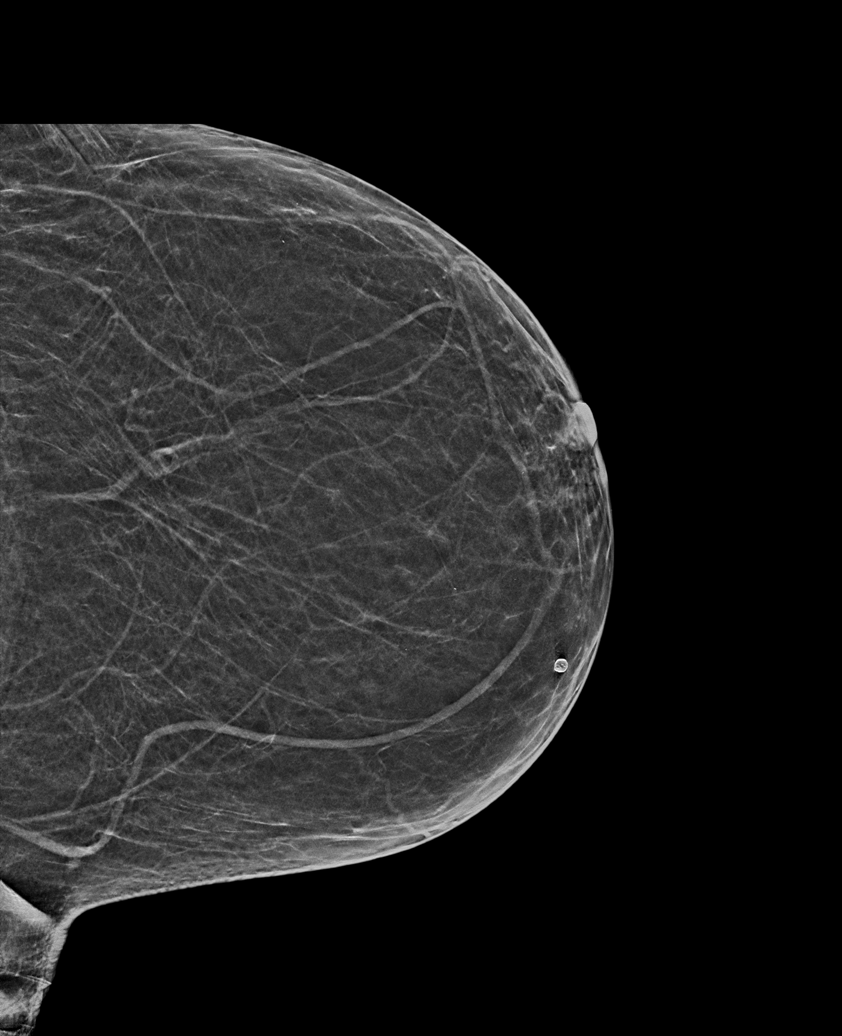

[R CC synth-2D (1 of 2)]
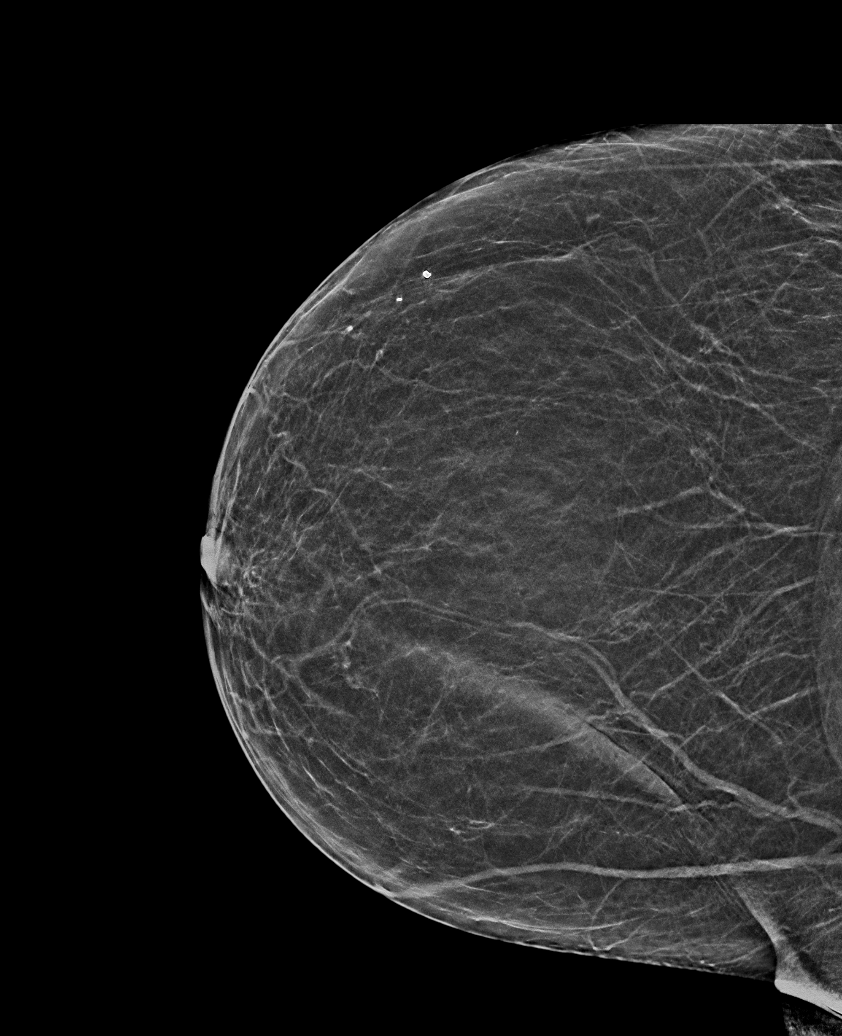

[R CC synth-2D (2 of 2)]
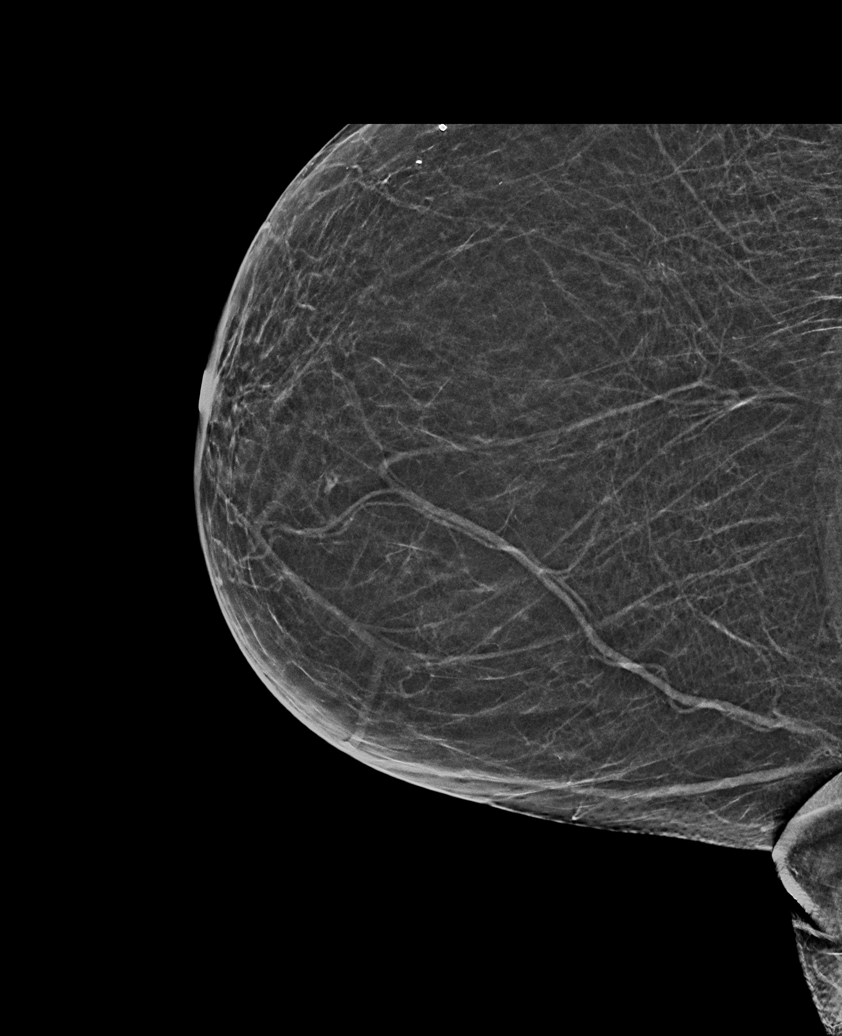

[L MLO synth-2D]
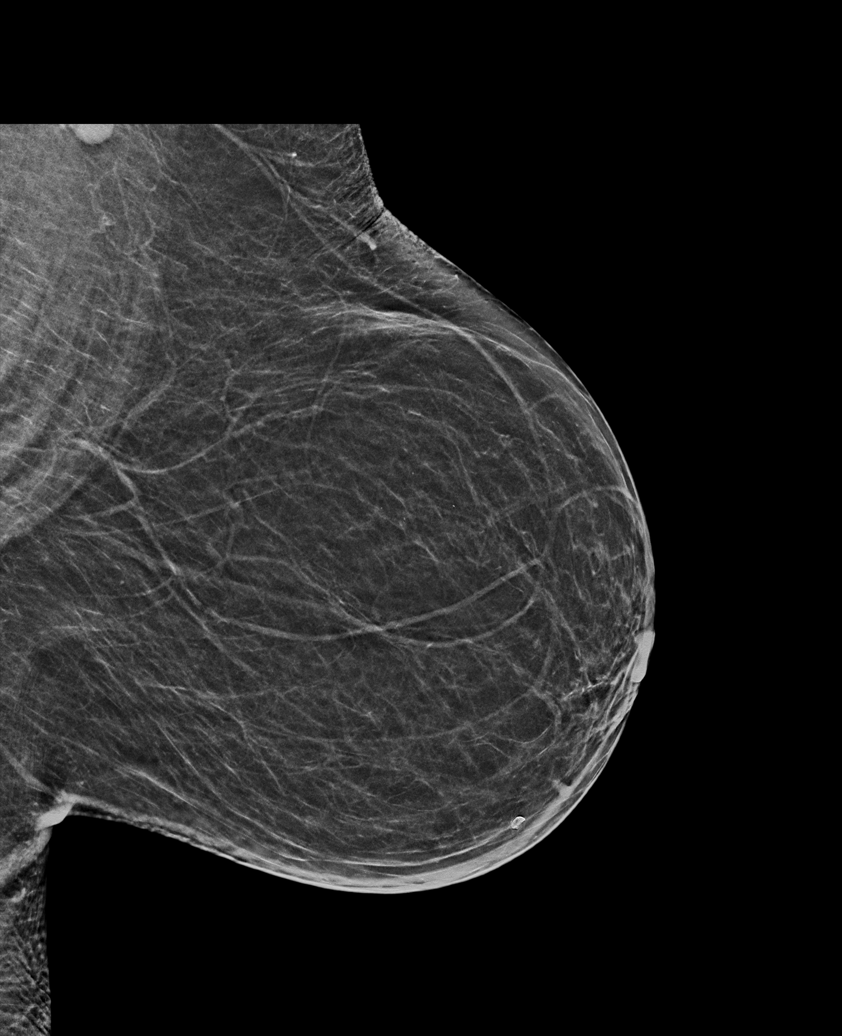

[R MLO synth-2D]
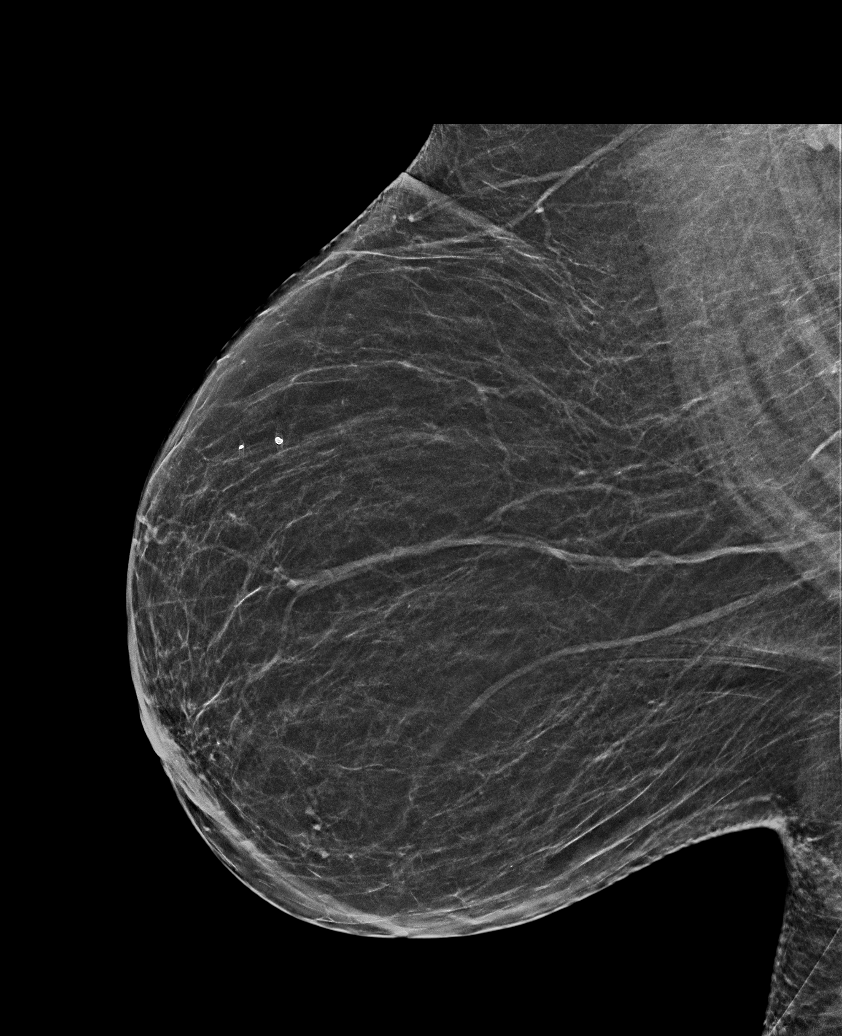

[R CC tomo · tomo slice 25/50.0]
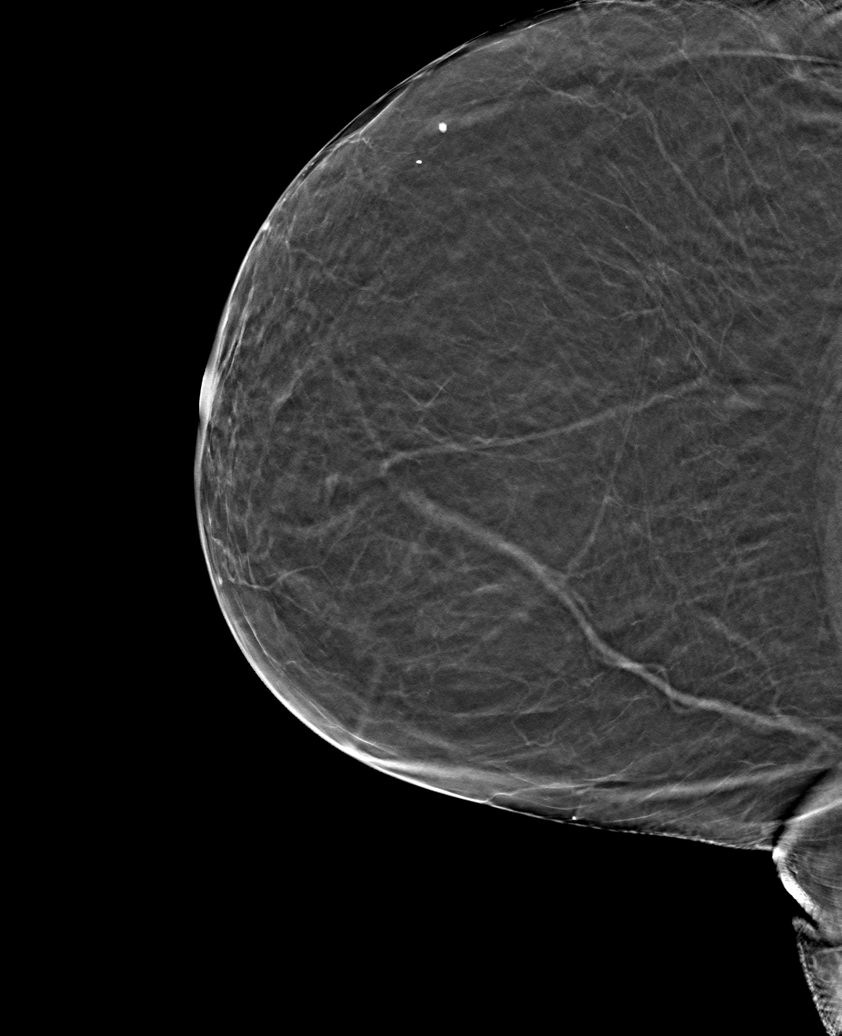

[6 of 30 positions shown; findings below may reference images not displayed]

ACR Breast Density Category b: There are scattered areas of
fibroglandular density.
FINDINGS: There are no findings suspicious for malignancy.
IMPRESSION: No mammographic evidence of malignancy. A result letter of this
screening mammogram will be mailed directly to the patient.

RECOMMENDATION:
Screening mammogram in one year. (Code:51-O-LD2)

BI-RADS CATEGORY  1: Negative.

## 2023-08-26 ENCOUNTER — Other Ambulatory Visit: Payer: Self-pay | Admitting: *Deleted

## 2023-08-26 DIAGNOSIS — E559 Vitamin D deficiency, unspecified: Secondary | ICD-10-CM

## 2023-08-26 MED ORDER — FOLIC ACID 1 MG PO TABS: 1.0000 mg | ORAL_TABLET | Freq: Every day | ORAL | 3 refills | Status: AC

## 2023-08-26 MED ORDER — VITAMIN D (ERGOCALCIFEROL) 1.25 MG (50000 UNIT) PO CAPS: 50000.0000 [IU] | ORAL_CAPSULE | ORAL | 0 refills | Status: AC

## 2023-08-29 ENCOUNTER — Other Ambulatory Visit: Payer: Self-pay | Admitting: Nurse Practitioner

## 2023-08-29 ENCOUNTER — Other Ambulatory Visit: Payer: Self-pay | Admitting: *Deleted

## 2023-08-29 DIAGNOSIS — E1169 Type 2 diabetes mellitus with other specified complication: Secondary | ICD-10-CM

## 2023-08-29 DIAGNOSIS — E785 Hyperlipidemia, unspecified: Secondary | ICD-10-CM

## 2023-08-29 DIAGNOSIS — D519 Vitamin B12 deficiency anemia, unspecified: Secondary | ICD-10-CM

## 2023-08-29 MED ORDER — CYANOCOBALAMIN 1000 MCG/ML IJ SOLN: INTRAMUSCULAR | 11 refills | Status: AC

## 2023-08-30 DIAGNOSIS — H04123 Dry eye syndrome of bilateral lacrimal glands: Secondary | ICD-10-CM | POA: Diagnosis not present

## 2023-08-30 DIAGNOSIS — H35361 Drusen (degenerative) of macula, right eye: Secondary | ICD-10-CM | POA: Diagnosis not present

## 2023-08-30 DIAGNOSIS — H16223 Keratoconjunctivitis sicca, not specified as Sjogren's, bilateral: Secondary | ICD-10-CM | POA: Diagnosis not present

## 2023-08-30 DIAGNOSIS — H0100A Unspecified blepharitis right eye, upper and lower eyelids: Secondary | ICD-10-CM | POA: Diagnosis not present

## 2023-08-30 DIAGNOSIS — H2513 Age-related nuclear cataract, bilateral: Secondary | ICD-10-CM | POA: Diagnosis not present

## 2023-09-06 ENCOUNTER — Other Ambulatory Visit

## 2023-09-06 DIAGNOSIS — E785 Hyperlipidemia, unspecified: Secondary | ICD-10-CM | POA: Diagnosis not present

## 2023-09-06 DIAGNOSIS — M791 Myalgia, unspecified site: Secondary | ICD-10-CM | POA: Diagnosis not present

## 2023-09-06 DIAGNOSIS — D649 Anemia, unspecified: Secondary | ICD-10-CM | POA: Diagnosis not present

## 2023-09-06 DIAGNOSIS — M542 Cervicalgia: Secondary | ICD-10-CM | POA: Diagnosis not present

## 2023-09-06 DIAGNOSIS — G518 Other disorders of facial nerve: Secondary | ICD-10-CM | POA: Diagnosis not present

## 2023-09-06 DIAGNOSIS — G43719 Chronic migraine without aura, intractable, without status migrainosus: Secondary | ICD-10-CM | POA: Diagnosis not present

## 2023-09-11 ENCOUNTER — Ambulatory Visit: Payer: Self-pay | Admitting: Nurse Practitioner

## 2023-09-12 LAB — TEST AUTHORIZATION

## 2023-09-12 LAB — LIPID PANEL
Cholesterol: 183 mg/dL (ref <200)
HDL: 63 mg/dL (ref >=50)
LDL Cholesterol (Calc): 101 mg/dL — ABNORMAL HIGH
Non-HDL Cholesterol (Calc): 120 mg/dL (ref <130)
Total CHOL/HDL Ratio: 2.9 (calc) (ref <5.0)
Triglycerides: 97 mg/dL (ref <150)

## 2023-09-12 LAB — COMPREHENSIVE METABOLIC PANEL WITH GFR
AG Ratio: 1.9 (calc) (ref 1.0–2.5)
ALT: 14 U/L (ref 6–29)
AST: 17 U/L (ref 10–35)
Albumin: 3.8 g/dL (ref 3.6–5.1)
Alkaline phosphatase (APISO): 53 U/L (ref 37–153)
BUN: 16 mg/dL (ref 7–25)
CO2: 29 mmol/L (ref 20–32)
Calcium: 9.1 mg/dL (ref 8.6–10.4)
Chloride: 107 mmol/L (ref 98–110)
Creat: 0.85 mg/dL (ref 0.50–1.05)
Globulin: 2 g/dL (ref 1.9–3.7)
Glucose, Bld: 79 mg/dL (ref 65–99)
Potassium: 3.9 mmol/L (ref 3.5–5.3)
Sodium: 141 mmol/L (ref 135–146)
Total Bilirubin: 0.4 mg/dL (ref 0.2–1.2)
Total Protein: 5.8 g/dL — ABNORMAL LOW (ref 6.1–8.1)
eGFR: 75 mL/min/1.73m2 (ref >=60)

## 2023-09-12 LAB — VITAMIN B12: Vitamin B-12: 240 pg/mL (ref 200–1100)

## 2023-09-12 LAB — HEMOGLOBIN A1C
Hgb A1c MFr Bld: 5.5 % (ref <5.7)
Mean Plasma Glucose: 111 mg/dL
eAG (mmol/L): 6.2 mmol/L

## 2023-09-12 LAB — CBC WITH DIFFERENTIAL/PLATELET
Absolute Lymphocytes: 1225 {cells}/uL (ref 850–3900)
Absolute Monocytes: 420 {cells}/uL (ref 200–950)
Basophils Absolute: 20 {cells}/uL (ref 0–200)
Basophils Relative: 0.4 %
Eosinophils Absolute: 90 {cells}/uL (ref 15–500)
Eosinophils Relative: 1.8 %
HCT: 35.6 % (ref 35.0–45.0)
Hemoglobin: 11.6 g/dL — ABNORMAL LOW (ref 11.7–15.5)
MCH: 32.9 pg (ref 27.0–33.0)
MCHC: 32.6 g/dL (ref 32.0–36.0)
MCV: 100.8 fL — ABNORMAL HIGH (ref 80.0–100.0)
MPV: 10.1 fL (ref 7.5–12.5)
Monocytes Relative: 8.4 %
Neutro Abs: 3245 {cells}/uL (ref 1500–7800)
Neutrophils Relative %: 64.9 %
Platelets: 336 Thousand/uL (ref 140–400)
RBC: 3.53 Million/uL — ABNORMAL LOW (ref 3.80–5.10)
RDW: 11.9 % (ref 11.0–15.0)
Total Lymphocyte: 24.5 %
WBC: 5 Thousand/uL (ref 3.8–10.8)

## 2023-09-12 LAB — IRON,TIBC AND FERRITIN PANEL
%SAT: 47 % — ABNORMAL HIGH (ref 16–45)
Ferritin: 115 ng/mL (ref 16–288)
Iron: 122 ug/dL (ref 45–160)
TIBC: 261 ug/dL (ref 250–450)

## 2023-09-12 NOTE — Telephone Encounter (Signed)
 April [name], [place]    09/12/23  1:36 PM Result Note Left message on voicemail for patient to return call when available. I confirmed with Powell that labs were added on.

## 2023-09-12 NOTE — Patient Instructions (Signed)
 1.) Please visit your local pharmacy to receive your Shingrix vaccine.

## 2023-09-12 NOTE — Telephone Encounter (Signed)
 Patient MyChart message routed to PCP Caro, Harlene POUR, NP as RICK.

## 2023-09-13 ENCOUNTER — Ambulatory Visit (INDEPENDENT_AMBULATORY_CARE_PROVIDER_SITE_OTHER): Admitting: Nurse Practitioner

## 2023-09-13 ENCOUNTER — Encounter: Payer: Self-pay | Admitting: Nurse Practitioner

## 2023-09-13 ENCOUNTER — Ambulatory Visit: Payer: Medicare Other

## 2023-09-13 VITALS — BP 120/74 | HR 67 | Temp 97.4°F | Ht 68.0 in | Wt 173.2 lb

## 2023-09-13 DIAGNOSIS — Z1211 Encounter for screening for malignant neoplasm of colon: Secondary | ICD-10-CM

## 2023-09-13 DIAGNOSIS — E1169 Type 2 diabetes mellitus with other specified complication: Secondary | ICD-10-CM | POA: Diagnosis not present

## 2023-09-13 DIAGNOSIS — E538 Deficiency of other specified B group vitamins: Secondary | ICD-10-CM | POA: Diagnosis not present

## 2023-09-13 DIAGNOSIS — M8000XD Age-related osteoporosis with current pathological fracture, unspecified site, subsequent encounter for fracture with routine healing: Secondary | ICD-10-CM

## 2023-09-13 DIAGNOSIS — E785 Hyperlipidemia, unspecified: Secondary | ICD-10-CM | POA: Diagnosis not present

## 2023-09-13 DIAGNOSIS — F17200 Nicotine dependence, unspecified, uncomplicated: Secondary | ICD-10-CM | POA: Diagnosis not present

## 2023-09-13 DIAGNOSIS — D508 Other iron deficiency anemias: Secondary | ICD-10-CM | POA: Diagnosis not present

## 2023-09-13 DIAGNOSIS — E038 Other specified hypothyroidism: Secondary | ICD-10-CM

## 2023-09-13 DIAGNOSIS — E034 Atrophy of thyroid (acquired): Secondary | ICD-10-CM | POA: Diagnosis not present

## 2023-09-13 DIAGNOSIS — M8000XS Age-related osteoporosis with current pathological fracture, unspecified site, sequela: Secondary | ICD-10-CM

## 2023-09-13 MED ORDER — COVID-19 MRNA VACCINE (PFIZER) 30 MCG/0.3ML IM SUSP: 0.3000 mL | Freq: Once | INTRAMUSCULAR | 0 refills | Status: AC

## 2023-09-13 MED ORDER — DENOSUMAB 60 MG/ML ~~LOC~~ SOSY: 60.0000 mg | PREFILLED_SYRINGE | Freq: Once | SUBCUTANEOUS | Status: AC

## 2023-09-13 NOTE — Assessment & Plan Note (Signed)
 A1c at goal, continue dietary compliance, routine foot care/monitoring and to keep up with diabetic eye exams through ophthalmology

## 2023-09-13 NOTE — Progress Notes (Signed)
 Careteam: Patient Care Team: Caro Harlene POUR, NP as PCP - General (Geriatric Medicine) Cloria Annabella CROME, DO as Consulting Physician (Geriatric Medicine) Tricia, Tawni CROME, MD as Referring Physician (Dermatology) Frazier, [place], OD (Optometry)  PLACE OF SERVICE:  [facility]  Advanced Directive information    No Known Allergies  Chief Complaint  Patient presents with   Medical Management of Chronic Issues    6 mo routine. Discussed need for Shingrix(plans to get at pharmachy), Covid vaccine(If qualifies), colonoscopy(Checking eligibility in progress. Will postpone), lung cancer screening(hasn't set up appointment yet but plans to). Also getting Prolia .    HPI:  Discussed the use of AI scribe software for clinical note transcription with the patient, who gave verbal consent to proceed.  History of Present Illness April [name] is a 68 year old female who presents for routine follow up.    Approximately three to four months ago, she experienced a fall while on vacation at the beach, attributing it to her foot being asleep, which caused her to turn her right foot awkwardly. She also experienced dizziness and passed out after taking a muscle relaxer (Zanaflex 4 mg) for a headache and falling asleep in a recliner. Since then, she has adjusted her medication intake, only taking it before bed or reducing the dose to half a tablet to avoid dizziness. No further episodes of dizziness or syncope have occurred since the incident four months ago.  She mentions increased stress at work due to the simultaneous resignation of four staff members, which has added to her fatigue and stress levels.   smokes three cigarettes a day, one in the morning, one at lunch, and one at the end of the day. Due for lung cancer screening- plans to call back to schedule this.   She has a history of gastric bypass surgery, which has impacted her nutritional intake. She reports difficulty consuming enough  protein and calcium , leading her to use Premier Protein drinks twice daily, providing 60 grams of protein and 50% of her daily calcium  needs. She also consumes yogurt for additional protein and calcium . She struggles with taking calcium  tablets and has noted low protein levels in the past but has a plan to increase.   She reports chronic anemia, managed by a hematologist, with iron infusions administered when her ferritin levels drop below 100. Her recent ferritin level was 115. She also acknowledges being lax with her B12 injections, having missed them for four months, but has resumed them recently.  She describes persistent numbness in her foot following surgery. She has started taking an over-the-counter vitamin for nerve health, including folic acid , but is unsure of its efficacy.  No chest pain, palpitations, or shortness of breath. Her bowel movements have normalized after a previous episode of gastrointestinal upset, which was resolved with a course of Flagyl .    Review of Systems:  Review of Systems  Constitutional:  Negative for chills, fever and weight loss.  HENT:  Negative for tinnitus.   Respiratory:  Negative for cough, sputum production and shortness of breath.   Cardiovascular:  Negative for chest pain, palpitations and leg swelling.  Gastrointestinal:  Negative for abdominal pain, constipation, diarrhea and heartburn.  Genitourinary:  Negative for dysuria, frequency and urgency.  Musculoskeletal:  Positive for falls. Negative for back pain, joint pain and myalgias.  Skin: Negative.   Neurological:  Positive for tingling. Negative for dizziness and headaches.  Psychiatric/Behavioral:  Negative for depression and memory loss. The patient does not  have insomnia.     Past Medical History:  Diagnosis Date   Anemia    Anxiety    Chronic back pain    Closed left ankle fracture 10/22/2020   in wheelchair   Dependence on wheelchair 10/27/2020   due to left ankle fracture  10-22-2020   History of COVID-19 09/05/2020   chest congestion, low grade fever all symptoms resolved per pt   Hypothyroidism    migraine    Osteoporosis with pathological fracture    Skin abnormality 10/27/2020   scab on left knee and left toes healing, using splint on left leg   Vitamin B 12 deficiency    Vitamin D  deficiency    Wears glasses    Past Surgical History:  Procedure Laterality Date   CESAREAN SECTION  8002,8008   COLONOSCOPY N/A 08/07/2013   Procedure: COLONOSCOPY;  Surgeon: Margo LITTIE Haddock, MD;  [facility];  Service: Endoscopy;  Laterality: N/A;  10:30-moved to 930 Leigh Ann notified pt   ESOPHAGOGASTRODUODENOSCOPY     ESOPHAGOGASTRODUODENOSCOPY N/A 08/07/2013   Procedure: ESOPHAGOGASTRODUODENOSCOPY (EGD);  Surgeon: Margo LITTIE Haddock, MD;  [facility]: [facility];  Service: Endoscopy;  Laterality: N/A;   FIBULA FRACTURE SURGERY Left 10/23/2020   GASTRIC BYPASS  01/08/2002   [place], [place]   ORIF ANKLE FRACTURE Left 11/02/2020   Procedure: OPEN REDUCTION INTERNAL FIXATION (ORIF) ANKLE FRACTURE AND SYNDESMOTIC FIXATION;  Surgeon: Barton Drape, MD;  Location: [facility] OR;  Service: Orthopedics;  Laterality: Left;  120   TONSILLECTOMY     Childhood   UPPER GI ENDOSCOPY  01/08/2002   Dr.Borhanian    Social History:   reports that she has been smoking cigarettes. She has a 21.5 pack-year smoking history. She has never used smokeless tobacco. She reports that she does not currently use drugs. She reports that she does not drink alcohol.  Family History  Problem Relation Age of Onset   Heart disease Mother    Diabetes Mother    Stroke Mother    Hypertension Mother    Diabetes Father    Heart disease Father    Hypertension Father    Migraines Daughter    Colon cancer Neg Hx     Medications: Patient's Medications  New Prescriptions   No medications on file  Previous Medications   ACETAMINOPHEN  (TYLENOL ) 500 MG TABLET    Take 500 mg by mouth every 6  (six) hours as needed for mild pain or headache.   CALCIUM  CARBONATE (CALCIUM  600) 600 MG TABS TABLET    Take 1 tablet (600 mg total) by mouth 2 (two) times daily with a meal.   COVID-19 MRNA VACCINE, PFIZER, 30 MCG/0.3ML INJECTION    Inject 0.3 mLs into the muscle once.   CYANOCOBALAMIN  (VITAMIN B12) 1000 MCG/ML INJECTION    INJECT 1ML INTO THE MUSCLE EVERY 21 DAYS.   DENOSUMAB  (PROLIA ) 60 MG/ML SOSY INJECTION    Inject 60 mg into the skin every 6 (six) months.   ESCITALOPRAM  (LEXAPRO ) 10 MG TABLET    TAKE ONE TABLET BY MOUTH ONCE DAILY.   FEXOFENADINE (ALLEGRA) 180 MG TABLET    Take 180 mg by mouth daily.   FOLIC ACID  (FOLVITE ) 1 MG TABLET    Take 1 tablet (1 mg total) by mouth daily.   LEVOTHYROXINE  (SYNTHROID ) 50 MCG TABLET    TAKE ONE TABLET BY MOUTH ONCE DAILY.   MELOXICAM (MOBIC) 7.5 MG TABLET    Take 7.5 mg by mouth as needed.  NAPROXEN SODIUM (ALEVE) 220 MG TABLET    Take 220 mg by mouth as needed.   PROBIOTIC PRODUCT (ALIGN PO)    Take 1 tablet by mouth daily.   TIZANIDINE (ZANAFLEX) 4 MG TABLET    Take 4 mg by mouth 2 (two) times daily as needed (migraine headaches.).   VITAMIN D , ERGOCALCIFEROL , (DRISDOL ) 1.25 MG (50000 UNIT) CAPS CAPSULE    Take 1 capsule (50,000 Units total) by mouth once a week.   ZONISAMIDE  (ZONEGRAN ) 100 MG CAPSULE    Take 200 mg by mouth at bedtime.  Modified Medications   No medications on file  Discontinued Medications   No medications on file    Physical Exam:  Vitals:   09/13/23 0902  BP: 120/74  Pulse: 67  Temp: (!) 97.4 F (36.3 C)  SpO2: 99%  Weight: 173 lb 3.2 oz (78.6 kg)  Height: 5' 8 (1.727 m)   Body mass index is 26.33 kg/m. Wt Readings from Last 3 Encounters:  09/13/23 173 lb 3.2 oz (78.6 kg)  03/29/23 182 lb 12.8 oz (82.9 kg)  11/05/22 193 lb 3.2 oz (87.6 kg)    Physical Exam Constitutional:      General: She is not in acute distress.    Appearance: She is well-developed. She is not diaphoretic.  HENT:     Head:  Normocephalic and atraumatic.     Mouth/Throat:     Pharynx: No oropharyngeal exudate.  Eyes:     Conjunctiva/sclera: Conjunctivae normal.     Pupils: Pupils are equal, round, and reactive to light.  Cardiovascular:     Rate and Rhythm: Normal rate and regular rhythm.     Heart sounds: Normal heart sounds.  Pulmonary:     Effort: Pulmonary effort is normal.     Breath sounds: Normal breath sounds.  Abdominal:     General: Bowel sounds are normal.     Palpations: Abdomen is soft.  Musculoskeletal:     Cervical back: Normal range of motion and neck supple.     Right lower leg: No edema.     Left lower leg: No edema.  Skin:    General: Skin is warm and dry.  Neurological:     Mental Status: She is alert and oriented to person, place, and time.     Motor: No weakness.     Gait: Gait normal.  Psychiatric:        Mood and Affect: Mood normal.    Labs reviewed: Basic Metabolic Panel: Recent Labs    03/01/23 1333 03/29/23 1014 09/06/23 0914  NA 141 143 141  K 3.8 4.7 3.9  CL 108 110 107  CO2 24 26 29   GLUCOSE 163* 85 79  BUN 16 18 16   CREATININE 1.30* 0.86 0.85  CALCIUM  9.1 8.5* 9.1  TSH  --  3.25  --    Liver Function Tests: Recent Labs    09/14/22 1310 11/05/22 0958 03/29/23 1014 09/06/23 0914  AST 17 11 16 17   ALT 17 7 17 14   ALKPHOS 70  --   --   --   BILITOT 0.5 0.3 0.3 0.4  PROT 6.2* 6.1 5.7* 5.8*  ALBUMIN 3.5  --   --   --    Recent Labs    11/05/22 0958  LIPASE 16  AMYLASE 17*   No results for input(s): AMMONIA in the last 8760 hours. CBC: Recent Labs    09/14/22 1310 11/05/22 0958 03/01/23 1333 09/06/23 0914  WBC 4.5 6.3  5.0 5.0  NEUTROABS 2.5 4,139  --  3,245  HGB 11.3* 12.8 12.1 11.6*  HCT 35.0* 38.3 36.1 35.6  MCV 102.6* 98.2 99.4 100.8*  PLT 276 422* 302 336   Lipid Panel: Recent Labs    09/28/22 0953 09/06/23 0914  CHOL 193 183  HDL 68 63  LDLCALC 106* 101*  TRIG 96 97  CHOLHDL 2.8 2.9   TSH: Recent Labs     03/29/23 1014  TSH 3.25   A1C: Lab Results  Component Value Date   HGBA1C 5.5 09/06/2023     Assessment/Plan  Age-related osteoporosis with current pathological fracture, sequela Assessment & Plan: Continues on cal, vit d and prolia  every 6 months.   Orders: -     Denosumab   Hypothyroidism due to acquired atrophy of thyroid  Assessment & Plan: TSH at goal on synthroid  50 mcg.    Hyperlipidemia, unspecified hyperlipidemia type Assessment & Plan: Stable, continue dietary modifications    Smoker Assessment & Plan: Encourage cessation   Type 2 diabetes mellitus with other specified complication, without long-term current use of insulin (HCC) Assessment & Plan: A1c at goal, continue dietary compliance, routine foot care/monitoring and to keep up with diabetic eye exams through ophthalmology     Other iron deficiency anemia Assessment & Plan: Noted to have anemia, Iron levels at goal, plans to follow up with hematologist    B12 deficiency Assessment & Plan: Low b12 level noted on lab, she has not been taking her her b12 injection routinely but plans to start.    Other orders -     COVID-19 mRNA Vaccine Proofreader); Inject 0.3 mLs into the muscle once for 1 dose. Dx: Z8  Dispense: 0.3 mL; Refill: 0    Return in about 6 months (around 03/12/2024) for routine follow up, labs prior to visit.  Obed Samek K. Caro BODILY [facility] [phone]

## 2023-09-13 NOTE — Assessment & Plan Note (Signed)
 Encourage cessation.

## 2023-09-13 NOTE — Assessment & Plan Note (Signed)
 TSH at goal on synthroid  50 mcg

## 2023-09-13 NOTE — Assessment & Plan Note (Signed)
 Continues on cal, vit d and prolia  every 6 months.

## 2023-09-13 NOTE — Assessment & Plan Note (Signed)
 Low b12 level noted on lab, she has not been taking her her b12 injection routinely but plans to start.

## 2023-09-13 NOTE — Assessment & Plan Note (Signed)
 Noted to have anemia, Iron levels at goal, plans to follow up with hematologist

## 2023-09-13 NOTE — Assessment & Plan Note (Signed)
 Stable, continue dietary modifications

## 2023-11-14 ENCOUNTER — Other Ambulatory Visit: Payer: Self-pay | Admitting: Nurse Practitioner

## 2024-01-29 ENCOUNTER — Telehealth: Payer: Self-pay | Admitting: *Deleted

## 2024-01-29 NOTE — Telephone Encounter (Signed)
 Received [employer]  Verification Copay 20% + $35= $356 PA Needed through [facility] Will Obtain

## 2024-01-30 NOTE — Telephone Encounter (Signed)
 [employer] Prior Auth Obtained and APPROVED 01/30/2024-01/29/2025 Auth#: [id number]

## 2024-03-13 ENCOUNTER — Other Ambulatory Visit: Payer: Self-pay

## 2024-03-20 ENCOUNTER — Ambulatory Visit: Payer: Self-pay | Admitting: Nurse Practitioner

## 2024-03-27 ENCOUNTER — Encounter: Payer: Self-pay | Admitting: Nurse Practitioner
